# Patient Record
Sex: Male | Born: 1935 | Race: White | Hispanic: No | Marital: Married | Smoking: Never smoker
Health system: Southern US, Community
[De-identification: ages and names within clinical notes are randomized; demographics above are authoritative.]

## PROBLEM LIST (undated history)

## (undated) DIAGNOSIS — M1990 Unspecified osteoarthritis, unspecified site: Secondary | ICD-10-CM

## (undated) DIAGNOSIS — Z87442 Personal history of urinary calculi: Secondary | ICD-10-CM

## (undated) DIAGNOSIS — I10 Essential (primary) hypertension: Secondary | ICD-10-CM

## (undated) DIAGNOSIS — Z8719 Personal history of other diseases of the digestive system: Secondary | ICD-10-CM

## (undated) DIAGNOSIS — E119 Type 2 diabetes mellitus without complications: Secondary | ICD-10-CM

## (undated) DIAGNOSIS — K219 Gastro-esophageal reflux disease without esophagitis: Secondary | ICD-10-CM

## (undated) DIAGNOSIS — G4730 Sleep apnea, unspecified: Secondary | ICD-10-CM

## (undated) DIAGNOSIS — I639 Cerebral infarction, unspecified: Secondary | ICD-10-CM

## (undated) DIAGNOSIS — N179 Acute kidney failure, unspecified: Secondary | ICD-10-CM

## (undated) DIAGNOSIS — N189 Chronic kidney disease, unspecified: Secondary | ICD-10-CM

## (undated) HISTORY — PX: COLONOSCOPY: SHX174

## (undated) HISTORY — PX: EYE SURGERY: SHX253

## (undated) HISTORY — PX: WRIST SURGERY: SHX841

---

## 2005-03-29 ENCOUNTER — Ambulatory Visit: Payer: Self-pay | Admitting: Internal Medicine

## 2005-06-02 ENCOUNTER — Ambulatory Visit: Payer: Self-pay | Admitting: Internal Medicine

## 2008-10-14 ENCOUNTER — Ambulatory Visit: Payer: Self-pay | Admitting: Unknown Physician Specialty

## 2011-11-12 ENCOUNTER — Ambulatory Visit: Payer: Self-pay | Admitting: Internal Medicine

## 2012-01-31 ENCOUNTER — Other Ambulatory Visit: Payer: Self-pay | Admitting: Sports Medicine

## 2012-02-04 LAB — BODY FLUID CULTURE

## 2013-01-07 ENCOUNTER — Ambulatory Visit: Payer: Self-pay | Admitting: Internal Medicine

## 2013-01-07 LAB — CREATININE, SERUM
Creatinine: 1.25 mg/dL (ref 0.60–1.30)
EGFR (African American): >60
EGFR (Non-African Amer.): 54 — ABNORMAL LOW

## 2013-02-07 ENCOUNTER — Emergency Department: Payer: Self-pay | Admitting: Emergency Medicine

## 2014-02-10 DIAGNOSIS — N200 Calculus of kidney: Secondary | ICD-10-CM | POA: Insufficient documentation

## 2014-02-12 ENCOUNTER — Ambulatory Visit: Payer: Self-pay | Admitting: Internal Medicine

## 2014-03-24 ENCOUNTER — Ambulatory Visit: Payer: Self-pay | Admitting: Unknown Physician Specialty

## 2014-10-08 DIAGNOSIS — M5136 Other intervertebral disc degeneration, lumbar region: Secondary | ICD-10-CM | POA: Insufficient documentation

## 2015-03-23 DIAGNOSIS — R972 Elevated prostate specific antigen [PSA]: Secondary | ICD-10-CM | POA: Insufficient documentation

## 2015-03-23 DIAGNOSIS — E538 Deficiency of other specified B group vitamins: Secondary | ICD-10-CM | POA: Insufficient documentation

## 2015-08-05 DIAGNOSIS — H2513 Age-related nuclear cataract, bilateral: Secondary | ICD-10-CM | POA: Diagnosis not present

## 2015-08-06 ENCOUNTER — Encounter: Payer: Self-pay | Admitting: *Deleted

## 2015-08-11 NOTE — Discharge Instructions (Signed)

## 2015-08-12 ENCOUNTER — Ambulatory Visit: Payer: PPO | Admitting: Anesthesiology

## 2015-08-12 ENCOUNTER — Ambulatory Visit
Admission: RE | Admit: 2015-08-12 | Discharge: 2015-08-12 | Disposition: A | Discharge status: Home / Self Care | Payer: PPO | Source: Ambulatory Visit | Attending: Ophthalmology | Admitting: Ophthalmology

## 2015-08-12 ENCOUNTER — Encounter
Admission: RE | Disposition: A | Discharge status: Home / Self Care | Payer: Self-pay | Source: Ambulatory Visit | Attending: Ophthalmology

## 2015-08-12 DIAGNOSIS — Z8701 Personal history of pneumonia (recurrent): Secondary | ICD-10-CM | POA: Diagnosis not present

## 2015-08-12 DIAGNOSIS — Z791 Long term (current) use of non-steroidal anti-inflammatories (NSAID): Secondary | ICD-10-CM | POA: Insufficient documentation

## 2015-08-12 DIAGNOSIS — Z79899 Other long term (current) drug therapy: Secondary | ICD-10-CM | POA: Insufficient documentation

## 2015-08-12 DIAGNOSIS — Z881 Allergy status to other antibiotic agents status: Secondary | ICD-10-CM | POA: Diagnosis not present

## 2015-08-12 DIAGNOSIS — Z9889 Other specified postprocedural states: Secondary | ICD-10-CM | POA: Insufficient documentation

## 2015-08-12 DIAGNOSIS — H2512 Age-related nuclear cataract, left eye: Secondary | ICD-10-CM | POA: Diagnosis not present

## 2015-08-12 DIAGNOSIS — E119 Type 2 diabetes mellitus without complications: Secondary | ICD-10-CM | POA: Insufficient documentation

## 2015-08-12 DIAGNOSIS — K449 Diaphragmatic hernia without obstruction or gangrene: Secondary | ICD-10-CM | POA: Diagnosis not present

## 2015-08-12 DIAGNOSIS — H269 Unspecified cataract: Secondary | ICD-10-CM | POA: Diagnosis not present

## 2015-08-12 DIAGNOSIS — H2513 Age-related nuclear cataract, bilateral: Secondary | ICD-10-CM | POA: Diagnosis not present

## 2015-08-12 HISTORY — PX: CATARACT EXTRACTION W/PHACO: SHX586

## 2015-08-12 HISTORY — DX: Type 2 diabetes mellitus without complications: E11.9

## 2015-08-12 HISTORY — DX: Personal history of other diseases of the digestive system: Z87.19

## 2015-08-12 SURGERY — PHACOEMULSIFICATION, CATARACT, WITH IOL INSERTION
Anesthesia: Monitor Anesthesia Care | Laterality: Left | Wound class: Clean

## 2015-08-12 MED ORDER — TETRACAINE HCL 0.5 % OP SOLN
1.0000 [drp] | OPHTHALMIC | Status: DC | PRN
  Administered 2015-08-12: 1 [drp] via OPHTHALMIC

## 2015-08-12 MED ORDER — ONDANSETRON HCL 4 MG/2ML IJ SOLN: 4.0000 mg | Freq: Once | INTRAMUSCULAR | Status: DC | PRN

## 2015-08-12 MED ORDER — TIMOLOL MALEATE 0.5 % OP SOLN
OPHTHALMIC | Status: DC | PRN
  Administered 2015-08-12: 1 [drp] via OPHTHALMIC

## 2015-08-12 MED ORDER — POVIDONE-IODINE 5 % OP SOLN
1.0000 "application " | OPHTHALMIC | Status: DC | PRN
  Administered 2015-08-12: 1 via OPHTHALMIC

## 2015-08-12 MED ORDER — FENTANYL CITRATE (PF) 100 MCG/2ML IJ SOLN
INTRAMUSCULAR | Status: DC | PRN
  Administered 2015-08-12: 50 ug via INTRAVENOUS

## 2015-08-12 MED ORDER — BRIMONIDINE TARTRATE 0.2 % OP SOLN
OPHTHALMIC | Status: DC | PRN
  Administered 2015-08-12: 1 [drp] via OPHTHALMIC

## 2015-08-12 MED ORDER — MIDAZOLAM HCL 2 MG/2ML IJ SOLN
INTRAMUSCULAR | Status: DC | PRN
  Administered 2015-08-12: 1 mg via INTRAVENOUS

## 2015-08-12 MED ORDER — CEFUROXIME OPHTHALMIC INJECTION 1 MG/0.1 ML
INJECTION | OPHTHALMIC | Status: DC | PRN
  Administered 2015-08-12: 0.1 mL via OPHTHALMIC

## 2015-08-12 MED ORDER — ACETAMINOPHEN 325 MG PO TABS: 325.0000 mg | ORAL_TABLET | ORAL | Status: DC | PRN

## 2015-08-12 MED ORDER — ARMC OPHTHALMIC DILATING GEL
1.0000 "application " | OPHTHALMIC | Status: DC | PRN
  Administered 2015-08-12 (×2): 1 via OPHTHALMIC

## 2015-08-12 MED ORDER — BALANCED SALT IO SOLN
INTRAOCULAR | Status: DC | PRN
  Administered 2015-08-12: 1 mL via OPHTHALMIC

## 2015-08-12 MED ORDER — ACETAMINOPHEN 160 MG/5ML PO SOLN: 325.0000 mg | ORAL | Status: DC | PRN

## 2015-08-12 MED ORDER — EPINEPHRINE HCL 1 MG/ML IJ SOLN
INTRAOCULAR | Status: DC | PRN
  Administered 2015-08-12: 100 mL via OPHTHALMIC

## 2015-08-12 MED ORDER — NA HYALUR & NA CHOND-NA HYALUR 0.4-0.35 ML IO KIT
PACK | INTRAOCULAR | Status: DC | PRN
  Administered 2015-08-12: 1 mL via INTRAOCULAR

## 2015-08-12 SURGICAL SUPPLY — 28 items
CANNULA ANT/CHMB 27GA (MISCELLANEOUS) ×3 IMPLANT
CARTRIDGE ABBOTT (MISCELLANEOUS) ×3 IMPLANT
GLOVE SURG LX 7.5 STRW (GLOVE) ×2
GLOVE SURG LX STRL 7.5 STRW (GLOVE) ×1 IMPLANT
GLOVE SURG TRIUMPH 8.0 PF LTX (GLOVE) ×3 IMPLANT
GOWN STRL REUS W/ TWL LRG LVL3 (GOWN DISPOSABLE) ×2 IMPLANT
GOWN STRL REUS W/TWL LRG LVL3 (GOWN DISPOSABLE) ×4
LENS IOL TECNIS TRC I 225 21.0 (Intraocular Lens) ×1 IMPLANT
LENS IOL TORIC 21.0 (Intraocular Lens) ×2 IMPLANT
LENS IOL TORIC 225 21.0 (Intraocular Lens) ×1 IMPLANT
MARKER SKIN SURG W/RULER VIO (MISCELLANEOUS) ×3 IMPLANT
NDL RETROBULBAR .5 NSTRL (NEEDLE) IMPLANT
NEEDLE FILTER BLUNT 18X 1/2SAF (NEEDLE) ×2
NEEDLE FILTER BLUNT 18X1 1/2 (NEEDLE) ×1 IMPLANT
PACK CATARACT BRASINGTON (MISCELLANEOUS) ×3 IMPLANT
PACK EYE AFTER SURG (MISCELLANEOUS) ×3 IMPLANT
PACK OPTHALMIC (MISCELLANEOUS) ×3 IMPLANT
RING MALYGIN 7.0 (MISCELLANEOUS) IMPLANT
SUT ETHILON 10-0 CS-B-6CS-B-6 (SUTURE)
SUT VICRYL  9 0 (SUTURE)
SUT VICRYL 9 0 (SUTURE) IMPLANT
SUTURE EHLN 10-0 CS-B-6CS-B-6 (SUTURE) IMPLANT
SYR 3ML LL SCALE MARK (SYRINGE) ×3 IMPLANT
SYR 5ML LL (SYRINGE) IMPLANT
SYR TB 1ML LUER SLIP (SYRINGE) ×3 IMPLANT
WATER STERILE IRR 250ML POUR (IV SOLUTION) ×3 IMPLANT
WATER STERILE IRR 500ML POUR (IV SOLUTION) IMPLANT
WIPE NON LINTING 3.25X3.25 (MISCELLANEOUS) ×3 IMPLANT

## 2015-08-12 NOTE — Anesthesia Procedure Notes (Signed)
Procedure Name: MAC Performed by: Lamiya Naas Pre-anesthesia Checklist: Patient identified, Emergency Drugs available, Suction available, Patient being monitored and Timeout performed Patient Re-evaluated:Patient Re-evaluated prior to inductionOxygen Delivery Method: Nasal cannula       

## 2015-08-12 NOTE — H&P (Signed)
  The History and Physical notes are on paper, have been signed, and are to be scanned. The patient remains stable and unchanged from the H&P.   Previous H&P reviewed, patient examined, and there are no changes.  David Myers 08/12/2015 8:28 AM

## 2015-08-12 NOTE — Op Note (Signed)
LOCATION:  [place]   PREOPERATIVE DIAGNOSIS:  Nuclear sclerotic cataract of the left eye.  H25.12  POSTOPERATIVE DIAGNOSIS:  Nuclear sclerotic cataract of the left eye.   PROCEDURE:  Phacoemulsification with Toric posterior chamber intraocular lens placement of the left eye.   LENS:  Implant Name Type Inv. Item Serial No. Manufacturer Lot No. LRB No. Used  tecnis toric IOL     [id number] [facility]   Left 1   ZCT 225 21.0 D Toric intraocular lens with 2.25 diopters of cylindrical power with axis orientation at 7 degrees.   ULTRASOUND TIME: 14.7 % of 1 minutes, 42 seconds.  CDE 15.2   SURGEON:  Wyonia Hough, MD   ANESTHESIA: Topical with tetracaine drops and 2% Xylocaine jelly, augmented with 1% preservative-free intracameral lidocaine.    COMPLICATIONS:  None.   DESCRIPTION OF PROCEDURE:  The patient was identified in the holding room and transported to the operating suite and placed in the supine position under the operating microscope.  The left eye was identified as the operative eye, and it was prepped and draped in the usual sterile ophthalmic fashion.    A clear-corneal paracentesis incision was made at the 1:30 position. 0.5 ml of preservative-free 1% lidocaine was injected into the anterior chamber.  The anterior chamber was filled with Viscoat.  A 2.4 millimeter near clear corneal incision was then made at the 10:30 position.  A cystotome and capsulorrhexis forceps were then used to make a curvilinear capsulorrhexis.  Hydrodissection and hydrodelineation were then performed using balanced salt solution.   Phacoemulsification was then used in stop and chop fashion to remove the lens, nucleus and epinucleus.  The remaining cortex was aspirated using the irrigation and aspiration handpiece.  Provisc viscoelastic was then placed into the capsular bag to distend it for lens placement.  The Verion digital marker was used to align the implant at the intended  axis.   A 21.0 diopter lens was then injected into the capsular bag.  It was rotated clockwise until the axis marks on the lens were approximately 15 degrees in the counterclockwise direction to the intended alignment.  The viscoelastic was aspirated from the eye using the irrigation aspiration handpiece.  Then, a Koch spatula through the sideport incision was used to rotate the lens in a clockwise direction until the axis markings of the intraocular lens were lined up with the Verion alignment.  Balanced salt solution was then used to hydrate the wounds. Cefuroxime 0.1 ml of a 10mg /ml solution was injected into the anterior chamber for a dose of 1 mg of intracameral antibiotic at the completion of the case.    The eye was noted to have a physiologic pressure and there was no wound leak noted.   Timolol and Brimonidine drops were applied to the eye.  The patient was taken to the recovery room in stable condition having had no complications of anesthesia or surgery.  David Myers 08/12/2015, 9:20 AM

## 2015-08-12 NOTE — Anesthesia Preprocedure Evaluation (Signed)
Anesthesia Evaluation  Patient identified by MRN, date of birth, ID band Patient awake    Reviewed: Allergy & Precautions, H&P , NPO status , Patient's Chart, lab work & pertinent test results  Airway Mallampati: II  TM Distance: >3 FB Neck ROM: full    Dental   Pulmonary    Pulmonary exam normal        Cardiovascular Normal cardiovascular exam     Neuro/Psych    GI/Hepatic hiatal hernia,   Endo/Other  diabetes  Renal/GU      Musculoskeletal   Abdominal   Peds  Hematology   Anesthesia Other Findings   Reproductive/Obstetrics                             Anesthesia Physical Anesthesia Plan  ASA: II  Anesthesia Plan: MAC   Post-op Pain Management:    Induction:   Airway Management Planned:   Additional Equipment:   Intra-op Plan:   Post-operative Plan:   Informed Consent: I have reviewed the patients History and Physical, chart, labs and discussed the procedure including the risks, benefits and alternatives for the proposed anesthesia with the patient or authorized representative who has indicated his/her understanding and acceptance.     Plan Discussed with: CRNA  Anesthesia Plan Comments:         Anesthesia Quick Evaluation

## 2015-08-12 NOTE — Anesthesia Postprocedure Evaluation (Signed)
Anesthesia Post Note  Patient: David Myers  Procedure(s) Performed: Procedure(s) (LRB): CATARACT EXTRACTION PHACO AND INTRAOCULAR LENS PLACEMENT (IOC) (Left)  Patient location during evaluation: PACU Anesthesia Type: MAC Level of consciousness: awake and alert Pain management: pain level controlled Vital Signs Assessment: post-procedure vital signs reviewed and stable Respiratory status: spontaneous breathing, nonlabored ventilation, respiratory function stable and patient connected to nasal cannula oxygen Cardiovascular status: stable and blood pressure returned to baseline Anesthetic complications: no    Amaryllis Dyke

## 2015-08-12 NOTE — Transfer of Care (Signed)
Immediate Anesthesia Transfer of Care Note  Patient: David Myers  Procedure(s) Performed: Procedure(s) with comments: CATARACT EXTRACTION PHACO AND INTRAOCULAR LENS PLACEMENT (IOC) (Left) - DIABETIC - diet controlled TORIC  Patient Location: PACU  Anesthesia Type: MAC  Level of Consciousness: awake, alert  and patient cooperative  Airway and Oxygen Therapy: Patient Spontanous Breathing and Patient connected to supplemental oxygen  Post-op Assessment: Post-op Vital signs reviewed, Patient's Cardiovascular Status Stable, Respiratory Function Stable, Patent Airway and No signs of Nausea or vomiting  Post-op Vital Signs: Reviewed and stable  Complications: No apparent anesthesia complications

## 2015-08-13 ENCOUNTER — Encounter: Payer: Self-pay | Admitting: Ophthalmology

## 2015-09-02 DIAGNOSIS — H2511 Age-related nuclear cataract, right eye: Secondary | ICD-10-CM | POA: Diagnosis not present

## 2015-09-03 ENCOUNTER — Encounter: Payer: Self-pay | Admitting: *Deleted

## 2015-09-08 NOTE — Discharge Instructions (Signed)

## 2015-09-09 ENCOUNTER — Ambulatory Visit: Payer: PPO | Admitting: Anesthesiology

## 2015-09-09 ENCOUNTER — Encounter
Admission: RE | Disposition: A | Discharge status: Home / Self Care | Payer: Self-pay | Source: Ambulatory Visit | Attending: Ophthalmology

## 2015-09-09 ENCOUNTER — Ambulatory Visit
Admission: RE | Admit: 2015-09-09 | Discharge: 2015-09-09 | Disposition: A | Discharge status: Home / Self Care | Payer: PPO | Source: Ambulatory Visit | Attending: Ophthalmology | Admitting: Ophthalmology

## 2015-09-09 DIAGNOSIS — H9190 Unspecified hearing loss, unspecified ear: Secondary | ICD-10-CM | POA: Insufficient documentation

## 2015-09-09 DIAGNOSIS — Z881 Allergy status to other antibiotic agents status: Secondary | ICD-10-CM | POA: Diagnosis not present

## 2015-09-09 DIAGNOSIS — E119 Type 2 diabetes mellitus without complications: Secondary | ICD-10-CM | POA: Diagnosis not present

## 2015-09-09 DIAGNOSIS — M549 Dorsalgia, unspecified: Secondary | ICD-10-CM | POA: Diagnosis not present

## 2015-09-09 DIAGNOSIS — Z79899 Other long term (current) drug therapy: Secondary | ICD-10-CM | POA: Diagnosis not present

## 2015-09-09 DIAGNOSIS — H2511 Age-related nuclear cataract, right eye: Secondary | ICD-10-CM | POA: Insufficient documentation

## 2015-09-09 DIAGNOSIS — K449 Diaphragmatic hernia without obstruction or gangrene: Secondary | ICD-10-CM | POA: Insufficient documentation

## 2015-09-09 DIAGNOSIS — Z9842 Cataract extraction status, left eye: Secondary | ICD-10-CM | POA: Diagnosis not present

## 2015-09-09 HISTORY — PX: CATARACT EXTRACTION W/PHACO: SHX586

## 2015-09-09 SURGERY — PHACOEMULSIFICATION, CATARACT, WITH IOL INSERTION
Anesthesia: Monitor Anesthesia Care | Laterality: Right | Wound class: Clean

## 2015-09-09 MED ORDER — TIMOLOL MALEATE 0.5 % OP SOLN
OPHTHALMIC | Status: DC | PRN
  Administered 2015-09-09: 1 [drp] via OPHTHALMIC

## 2015-09-09 MED ORDER — ARMC OPHTHALMIC DILATING GEL
1.0000 "application " | OPHTHALMIC | Status: DC | PRN
  Administered 2015-09-09 (×2): 1 via OPHTHALMIC

## 2015-09-09 MED ORDER — NA HYALUR & NA CHOND-NA HYALUR 0.4-0.35 ML IO KIT
PACK | INTRAOCULAR | Status: DC | PRN
  Administered 2015-09-09: 1 mL via INTRAOCULAR

## 2015-09-09 MED ORDER — LACTATED RINGERS IV SOLN: INTRAVENOUS | Status: DC

## 2015-09-09 MED ORDER — TETRACAINE HCL 0.5 % OP SOLN
1.0000 [drp] | OPHTHALMIC | Status: DC | PRN
  Administered 2015-09-09: 1 [drp] via OPHTHALMIC

## 2015-09-09 MED ORDER — BRIMONIDINE TARTRATE 0.2 % OP SOLN
OPHTHALMIC | Status: DC | PRN
  Administered 2015-09-09: 1 [drp] via OPHTHALMIC

## 2015-09-09 MED ORDER — FENTANYL CITRATE (PF) 100 MCG/2ML IJ SOLN: 25.0000 ug | INTRAMUSCULAR | Status: DC | PRN

## 2015-09-09 MED ORDER — MIDAZOLAM HCL 5 MG/5ML IJ SOLN
INTRAMUSCULAR | Status: DC | PRN
  Administered 2015-09-09: 2 mg via INTRAVENOUS

## 2015-09-09 MED ORDER — OXYCODONE HCL 5 MG/5ML PO SOLN: 5.0000 mg | Freq: Once | ORAL | Status: DC | PRN

## 2015-09-09 MED ORDER — ACETAMINOPHEN 160 MG/5ML PO SOLN: 325.0000 mg | ORAL | Status: DC | PRN

## 2015-09-09 MED ORDER — DEXAMETHASONE SODIUM PHOSPHATE 4 MG/ML IJ SOLN: 8.0000 mg | Freq: Once | INTRAMUSCULAR | Status: DC | PRN

## 2015-09-09 MED ORDER — CEFUROXIME OPHTHALMIC INJECTION 1 MG/0.1 ML
INJECTION | OPHTHALMIC | Status: DC | PRN
  Administered 2015-09-09: 0.1 mL via OPHTHALMIC

## 2015-09-09 MED ORDER — ACETAMINOPHEN 325 MG PO TABS: 325.0000 mg | ORAL_TABLET | ORAL | Status: DC | PRN

## 2015-09-09 MED ORDER — POVIDONE-IODINE 5 % OP SOLN
1.0000 "application " | OPHTHALMIC | Status: DC | PRN
  Administered 2015-09-09: 1 via OPHTHALMIC

## 2015-09-09 MED ORDER — FENTANYL CITRATE (PF) 100 MCG/2ML IJ SOLN
INTRAMUSCULAR | Status: DC | PRN
  Administered 2015-09-09: 50 ug via INTRAVENOUS

## 2015-09-09 MED ORDER — LACTATED RINGERS IV SOLN: 500.0000 mL | INTRAVENOUS | Status: DC

## 2015-09-09 MED ORDER — ERYTHROMYCIN 5 MG/GM OP OINT
TOPICAL_OINTMENT | OPHTHALMIC | Status: DC | PRN
  Administered 2015-09-09: 1 via OPHTHALMIC

## 2015-09-09 MED ORDER — EPINEPHRINE HCL 1 MG/ML IJ SOLN
INTRAOCULAR | Status: DC | PRN
  Administered 2015-09-09: 80 mL via OPHTHALMIC

## 2015-09-09 MED ORDER — OXYCODONE HCL 5 MG PO TABS: 5.0000 mg | ORAL_TABLET | Freq: Once | ORAL | Status: DC | PRN

## 2015-09-09 MED ORDER — LIDOCAINE HCL (PF) 4 % IJ SOLN
INTRAMUSCULAR | Status: DC | PRN
  Administered 2015-09-09: 1 mL via OPHTHALMIC

## 2015-09-09 SURGICAL SUPPLY — 28 items
CANNULA ANT/CHMB 27GA (MISCELLANEOUS) ×3 IMPLANT
CARTRIDGE ABBOTT (MISCELLANEOUS) ×3 IMPLANT
GLOVE SURG LX 7.5 STRW (GLOVE) ×2
GLOVE SURG LX STRL 7.5 STRW (GLOVE) ×1 IMPLANT
GLOVE SURG TRIUMPH 8.0 PF LTX (GLOVE) ×3 IMPLANT
GOWN STRL REUS W/ TWL LRG LVL3 (GOWN DISPOSABLE) ×2 IMPLANT
GOWN STRL REUS W/TWL LRG LVL3 (GOWN DISPOSABLE) ×4
LENS IOL TECNIS TRC I 300 21.5 (Intraocular Lens) ×1 IMPLANT
LENS IOL TORIC 21.5 (Intraocular Lens) ×2 IMPLANT
LENS IOL TORIC 300 21.5 (Intraocular Lens) ×1 IMPLANT
MARKER SKIN SURG W/RULER VIO (MISCELLANEOUS) ×3 IMPLANT
NDL RETROBULBAR .5 NSTRL (NEEDLE) IMPLANT
NEEDLE FILTER BLUNT 18X 1/2SAF (NEEDLE) ×2
NEEDLE FILTER BLUNT 18X1 1/2 (NEEDLE) ×1 IMPLANT
PACK CATARACT BRASINGTON (MISCELLANEOUS) ×3 IMPLANT
PACK EYE AFTER SURG (MISCELLANEOUS) ×3 IMPLANT
PACK OPTHALMIC (MISCELLANEOUS) ×3 IMPLANT
RING MALYGIN 7.0 (MISCELLANEOUS) IMPLANT
SUT ETHILON 10-0 CS-B-6CS-B-6 (SUTURE)
SUT VICRYL  9 0 (SUTURE)
SUT VICRYL 9 0 (SUTURE) IMPLANT
SUTURE EHLN 10-0 CS-B-6CS-B-6 (SUTURE) IMPLANT
SYR 3ML LL SCALE MARK (SYRINGE) ×3 IMPLANT
SYR 5ML LL (SYRINGE) IMPLANT
SYR TB 1ML LUER SLIP (SYRINGE) ×3 IMPLANT
WATER STERILE IRR 250ML POUR (IV SOLUTION) ×3 IMPLANT
WATER STERILE IRR 500ML POUR (IV SOLUTION) IMPLANT
WIPE NON LINTING 3.25X3.25 (MISCELLANEOUS) ×3 IMPLANT

## 2015-09-09 NOTE — Anesthesia Preprocedure Evaluation (Addendum)
Anesthesia Evaluation  Patient identified by MRN, date of birth, ID band Patient awake    Reviewed: Allergy & Precautions, H&P , NPO status , Patient's Chart, lab work & pertinent test results, reviewed documented beta blocker date and time   Airway Mallampati: II  TM Distance: >3 FB Neck ROM: full    Dental no notable dental hx.    Pulmonary neg pulmonary ROS,    Pulmonary exam normal breath sounds clear to auscultation       Cardiovascular Exercise Tolerance: Good negative cardio ROS   Rhythm:regular Rate:Normal     Neuro/Psych negative neurological ROS  negative psych ROS   GI/Hepatic Neg liver ROS, hiatal hernia,   Endo/Other  diabetes, Well Controlled, Type 2  Renal/GU negative Renal ROS  negative genitourinary   Musculoskeletal   Abdominal   Peds  Hematology negative hematology ROS (+)   Anesthesia Other Findings   Reproductive/Obstetrics negative OB ROS                            Anesthesia Physical Anesthesia Plan  ASA: II  Anesthesia Plan: MAC   Post-op Pain Management:    Induction:   Airway Management Planned:   Additional Equipment:   Intra-op Plan:   Post-operative Plan:   Informed Consent: I have reviewed the patients History and Physical, chart, labs and discussed the procedure including the risks, benefits and alternatives for the proposed anesthesia with the patient or authorized representative who has indicated his/her understanding and acceptance.     Plan Discussed with: CRNA  Anesthesia Plan Comments:        Anesthesia Quick Evaluation

## 2015-09-09 NOTE — Anesthesia Postprocedure Evaluation (Signed)
Anesthesia Post Note  Patient: David Myers  Procedure(s) Performed: Procedure(s) (LRB): CATARACT EXTRACTION PHACO AND INTRAOCULAR LENS PLACEMENT (IOC) (Right)  Patient location during evaluation: PACU Anesthesia Type: MAC Level of consciousness: awake and alert Pain management: pain level controlled Vital Signs Assessment: post-procedure vital signs reviewed and stable Respiratory status: spontaneous breathing, nonlabored ventilation and respiratory function stable Cardiovascular status: blood pressure returned to baseline and stable Postop Assessment: no signs of nausea or vomiting Anesthetic complications: no    DANIEL D KOVACS

## 2015-09-09 NOTE — Op Note (Signed)
LOCATION:  [place]   PREOPERATIVE DIAGNOSIS:  Nuclear sclerotic cataract of the right eye.  H25.11   POSTOPERATIVE DIAGNOSIS:  Nuclear sclerotic cataract of the right eye.   PROCEDURE:  Phacoemulsification with Toric posterior chamber intraocular lens placement of the right eye.   LENS:   Implant Name Type Inv. Item Serial No. Manufacturer Lot No. LRB No. Used  [id number]     [id number] [facility]   Right 1     ZCT300 21.5 D Toric intraocular lens with 3.00 diopters of cylindrical power with axis orientation at 12 degrees.   ULTRASOUND TIME: 15 % of 1 minutes, 44 seconds.  CDE 15.3   SURGEON:  Wyonia Hough, MD   ANESTHESIA:  Topical with tetracaine drops and 2% Xylocaine jelly, augmented with 1% preservative-free intracameral lidocaine.    COMPLICATIONS:  None.   DESCRIPTION OF PROCEDURE:  The patient was identified in the holding room and transported to the operating suite and placed in the supine position under the operating microscope.  The right eye was identified as the operative eye, and it was prepped and draped in the usual sterile ophthalmic fashion.    A clear-corneal paracentesis incision was made at the 12:00 position.  0.5 ml of preservative-free 1% lidocaine was injected into the anterior chamber. The anterior chamber was filled with Viscoat.  A 2.4 millimeter near clear corneal incision was then made at the 9:00 position.  A cystotome and capsulorrhexis forceps were then used to make a curvilinear capsulorrhexis.  Hydrodissection and hydrodelineation were then performed using balanced salt solution.   Phacoemulsification was then used in stop and chop fashion to remove the lens, nucleus and epinucleus.  The remaining cortex was aspirated using the irrigation and aspiration handpiece.  Provisc viscoelastic was then placed into the capsular bag to distend it for lens placement.  The Verion digital marker was used to align the implant at the intended axis.   A Toric lens was then injected into the capsular bag.  It was rotated clockwise until the axis marks on the lens were approximately 15 degrees in the counterclockwise direction to the intended alignment.  The viscoelastic was aspirated from the eye using the irrigation aspiration handpiece.  Then, a Koch spatula through the sideport incision was used to rotate the lens in a clockwise direction until the axis markings of the intraocular lens were lined up with the Verion alignment.  Balanced salt solution was then used to hydrate the wounds. Cefuroxime 0.1 ml of a 10mg /ml solution was injected into the anterior chamber for a dose of 1 mg of intracameral antibiotic at the completion of the case.    The eye was noted to have a physiologic pressure and there was no wound leak noted.   Timolol and Brimonidine drops and erythromycin ointment were applied to the eye.  The patient was taken to the recovery room in stable condition having had no complications of anesthesia or surgery.  David Myers 09/09/2015, 8:09 AM

## 2015-09-09 NOTE — Transfer of Care (Signed)
Immediate Anesthesia Transfer of Care Note  Patient: David Myers  Procedure(s) Performed: Procedure(s) with comments: CATARACT EXTRACTION PHACO AND INTRAOCULAR LENS PLACEMENT (IOC) (Right) - DIABETIC - diet controlled  Patient Location: PACU  Anesthesia Type: MAC  Level of Consciousness: awake, alert  and patient cooperative  Airway and Oxygen Therapy: Patient Spontanous Breathing and Patient connected to supplemental oxygen  Post-op Assessment: Post-op Vital signs reviewed, Patient's Cardiovascular Status Stable, Respiratory Function Stable, Patent Airway and No signs of Nausea or vomiting  Post-op Vital Signs: Reviewed and stable  Complications: No apparent anesthesia complications

## 2015-09-09 NOTE — Anesthesia Procedure Notes (Signed)
Procedure Name: MAC Performed by: Stryker Veasey Pre-anesthesia Checklist: Patient identified, Emergency Drugs available, Suction available, Timeout performed and Patient being monitored Patient Re-evaluated:Patient Re-evaluated prior to inductionOxygen Delivery Method: Nasal cannula Placement Confirmation: positive ETCO2     

## 2015-09-09 NOTE — H&P (Signed)
  The History and Physical notes are on paper, have been signed, and are to be scanned. The patient remains stable and unchanged from the H&P.   Previous H&P reviewed, patient examined, and there are no changes.  Steven Veazie 09/09/2015 7:33 AM

## 2015-09-10 ENCOUNTER — Encounter: Payer: Self-pay | Admitting: Ophthalmology

## 2015-09-16 DIAGNOSIS — E559 Vitamin D deficiency, unspecified: Secondary | ICD-10-CM | POA: Diagnosis not present

## 2015-09-16 DIAGNOSIS — R972 Elevated prostate specific antigen [PSA]: Secondary | ICD-10-CM | POA: Diagnosis not present

## 2015-09-16 DIAGNOSIS — E119 Type 2 diabetes mellitus without complications: Secondary | ICD-10-CM | POA: Diagnosis not present

## 2015-09-16 DIAGNOSIS — E538 Deficiency of other specified B group vitamins: Secondary | ICD-10-CM | POA: Diagnosis not present

## 2015-09-16 DIAGNOSIS — Z79899 Other long term (current) drug therapy: Secondary | ICD-10-CM | POA: Diagnosis not present

## 2015-09-23 DIAGNOSIS — E119 Type 2 diabetes mellitus without complications: Secondary | ICD-10-CM | POA: Diagnosis not present

## 2015-09-23 DIAGNOSIS — Z0000 Encounter for general adult medical examination without abnormal findings: Secondary | ICD-10-CM | POA: Diagnosis not present

## 2015-09-23 DIAGNOSIS — E538 Deficiency of other specified B group vitamins: Secondary | ICD-10-CM | POA: Diagnosis not present

## 2015-10-01 ENCOUNTER — Ambulatory Visit
Admission: RE | Admit: 2015-10-01 | Discharge: 2015-10-01 | Disposition: A | Discharge status: Home / Self Care | Payer: PPO | Source: Ambulatory Visit | Attending: Internal Medicine | Admitting: Internal Medicine

## 2015-10-01 ENCOUNTER — Other Ambulatory Visit: Payer: Self-pay | Admitting: Internal Medicine

## 2015-10-01 DIAGNOSIS — I6529 Occlusion and stenosis of unspecified carotid artery: Secondary | ICD-10-CM | POA: Diagnosis not present

## 2015-10-01 DIAGNOSIS — G319 Degenerative disease of nervous system, unspecified: Secondary | ICD-10-CM | POA: Insufficient documentation

## 2015-10-01 DIAGNOSIS — D329 Benign neoplasm of meninges, unspecified: Secondary | ICD-10-CM | POA: Diagnosis not present

## 2015-10-01 DIAGNOSIS — R9082 White matter disease, unspecified: Secondary | ICD-10-CM | POA: Insufficient documentation

## 2015-10-01 DIAGNOSIS — I639 Cerebral infarction, unspecified: Secondary | ICD-10-CM | POA: Diagnosis not present

## 2015-10-01 DIAGNOSIS — I6523 Occlusion and stenosis of bilateral carotid arteries: Secondary | ICD-10-CM | POA: Diagnosis not present

## 2015-10-01 DIAGNOSIS — D320 Benign neoplasm of cerebral meninges: Secondary | ICD-10-CM | POA: Diagnosis not present

## 2015-10-01 MED ORDER — GADOBENATE DIMEGLUMINE 529 MG/ML IV SOLN
20.0000 mL | Freq: Once | INTRAVENOUS | Status: AC | PRN
  Administered 2015-10-01: 17 mL via INTRAVENOUS

## 2015-10-07 DIAGNOSIS — I639 Cerebral infarction, unspecified: Secondary | ICD-10-CM | POA: Diagnosis not present

## 2015-10-08 DIAGNOSIS — K219 Gastro-esophageal reflux disease without esophagitis: Secondary | ICD-10-CM | POA: Diagnosis not present

## 2015-10-08 DIAGNOSIS — R490 Dysphonia: Secondary | ICD-10-CM | POA: Diagnosis not present

## 2015-11-12 DIAGNOSIS — N3289 Other specified disorders of bladder: Secondary | ICD-10-CM | POA: Diagnosis not present

## 2015-11-12 DIAGNOSIS — G451 Carotid artery syndrome (hemispheric): Secondary | ICD-10-CM | POA: Diagnosis not present

## 2015-11-19 DIAGNOSIS — R490 Dysphonia: Secondary | ICD-10-CM | POA: Diagnosis not present

## 2015-11-19 DIAGNOSIS — H903 Sensorineural hearing loss, bilateral: Secondary | ICD-10-CM | POA: Diagnosis not present

## 2015-11-19 DIAGNOSIS — K219 Gastro-esophageal reflux disease without esophagitis: Secondary | ICD-10-CM | POA: Diagnosis not present

## 2016-03-18 DIAGNOSIS — E119 Type 2 diabetes mellitus without complications: Secondary | ICD-10-CM | POA: Diagnosis not present

## 2016-03-18 DIAGNOSIS — E538 Deficiency of other specified B group vitamins: Secondary | ICD-10-CM | POA: Diagnosis not present

## 2016-03-18 DIAGNOSIS — Z0000 Encounter for general adult medical examination without abnormal findings: Secondary | ICD-10-CM | POA: Diagnosis not present

## 2016-03-22 DIAGNOSIS — E119 Type 2 diabetes mellitus without complications: Secondary | ICD-10-CM | POA: Diagnosis not present

## 2016-03-22 DIAGNOSIS — E538 Deficiency of other specified B group vitamins: Secondary | ICD-10-CM | POA: Diagnosis not present

## 2016-04-11 DIAGNOSIS — H401212 Low-tension glaucoma, right eye, moderate stage: Secondary | ICD-10-CM | POA: Diagnosis not present

## 2016-04-11 DIAGNOSIS — Z961 Presence of intraocular lens: Secondary | ICD-10-CM | POA: Diagnosis not present

## 2016-04-11 DIAGNOSIS — H5203 Hypermetropia, bilateral: Secondary | ICD-10-CM | POA: Diagnosis not present

## 2016-04-11 DIAGNOSIS — H524 Presbyopia: Secondary | ICD-10-CM | POA: Diagnosis not present

## 2016-04-11 DIAGNOSIS — H401221 Low-tension glaucoma, left eye, mild stage: Secondary | ICD-10-CM | POA: Diagnosis not present

## 2016-04-11 DIAGNOSIS — H43811 Vitreous degeneration, right eye: Secondary | ICD-10-CM | POA: Diagnosis not present

## 2016-04-11 DIAGNOSIS — H52221 Regular astigmatism, right eye: Secondary | ICD-10-CM | POA: Diagnosis not present

## 2016-06-10 DIAGNOSIS — D2339 Other benign neoplasm of skin of other parts of face: Secondary | ICD-10-CM | POA: Diagnosis not present

## 2016-06-10 DIAGNOSIS — D225 Melanocytic nevi of trunk: Secondary | ICD-10-CM | POA: Diagnosis not present

## 2016-06-10 DIAGNOSIS — D2261 Melanocytic nevi of right upper limb, including shoulder: Secondary | ICD-10-CM | POA: Diagnosis not present

## 2016-06-10 DIAGNOSIS — L821 Other seborrheic keratosis: Secondary | ICD-10-CM | POA: Diagnosis not present

## 2016-07-11 DIAGNOSIS — H52221 Regular astigmatism, right eye: Secondary | ICD-10-CM | POA: Diagnosis not present

## 2016-07-11 DIAGNOSIS — H401212 Low-tension glaucoma, right eye, moderate stage: Secondary | ICD-10-CM | POA: Diagnosis not present

## 2016-07-11 DIAGNOSIS — Z961 Presence of intraocular lens: Secondary | ICD-10-CM | POA: Diagnosis not present

## 2016-07-11 DIAGNOSIS — H401221 Low-tension glaucoma, left eye, mild stage: Secondary | ICD-10-CM | POA: Diagnosis not present

## 2016-07-11 DIAGNOSIS — H5203 Hypermetropia, bilateral: Secondary | ICD-10-CM | POA: Diagnosis not present

## 2016-07-11 DIAGNOSIS — H524 Presbyopia: Secondary | ICD-10-CM | POA: Diagnosis not present

## 2016-07-11 DIAGNOSIS — H43811 Vitreous degeneration, right eye: Secondary | ICD-10-CM | POA: Diagnosis not present

## 2016-09-19 DIAGNOSIS — Z125 Encounter for screening for malignant neoplasm of prostate: Secondary | ICD-10-CM | POA: Diagnosis not present

## 2016-09-19 DIAGNOSIS — E538 Deficiency of other specified B group vitamins: Secondary | ICD-10-CM | POA: Diagnosis not present

## 2016-09-19 DIAGNOSIS — E119 Type 2 diabetes mellitus without complications: Secondary | ICD-10-CM | POA: Diagnosis not present

## 2016-09-26 DIAGNOSIS — R7989 Other specified abnormal findings of blood chemistry: Secondary | ICD-10-CM | POA: Insufficient documentation

## 2016-09-26 DIAGNOSIS — E119 Type 2 diabetes mellitus without complications: Secondary | ICD-10-CM | POA: Diagnosis not present

## 2016-09-26 DIAGNOSIS — Z0000 Encounter for general adult medical examination without abnormal findings: Secondary | ICD-10-CM | POA: Diagnosis not present

## 2016-10-04 DIAGNOSIS — H524 Presbyopia: Secondary | ICD-10-CM | POA: Diagnosis not present

## 2016-10-04 DIAGNOSIS — H43811 Vitreous degeneration, right eye: Secondary | ICD-10-CM | POA: Diagnosis not present

## 2016-10-04 DIAGNOSIS — H47233 Glaucomatous optic atrophy, bilateral: Secondary | ICD-10-CM | POA: Diagnosis not present

## 2016-10-04 DIAGNOSIS — Z961 Presence of intraocular lens: Secondary | ICD-10-CM | POA: Diagnosis not present

## 2016-10-04 DIAGNOSIS — H52221 Regular astigmatism, right eye: Secondary | ICD-10-CM | POA: Diagnosis not present

## 2016-10-04 DIAGNOSIS — H401212 Low-tension glaucoma, right eye, moderate stage: Secondary | ICD-10-CM | POA: Diagnosis not present

## 2016-10-04 DIAGNOSIS — H401221 Low-tension glaucoma, left eye, mild stage: Secondary | ICD-10-CM | POA: Diagnosis not present

## 2016-10-04 DIAGNOSIS — H5203 Hypermetropia, bilateral: Secondary | ICD-10-CM | POA: Diagnosis not present

## 2017-01-03 DIAGNOSIS — H5203 Hypermetropia, bilateral: Secondary | ICD-10-CM | POA: Diagnosis not present

## 2017-01-03 DIAGNOSIS — H52221 Regular astigmatism, right eye: Secondary | ICD-10-CM | POA: Diagnosis not present

## 2017-01-03 DIAGNOSIS — H43811 Vitreous degeneration, right eye: Secondary | ICD-10-CM | POA: Diagnosis not present

## 2017-01-03 DIAGNOSIS — H401231 Low-tension glaucoma, bilateral, mild stage: Secondary | ICD-10-CM | POA: Diagnosis not present

## 2017-01-03 DIAGNOSIS — H524 Presbyopia: Secondary | ICD-10-CM | POA: Diagnosis not present

## 2017-01-03 DIAGNOSIS — Z961 Presence of intraocular lens: Secondary | ICD-10-CM | POA: Diagnosis not present

## 2017-01-03 DIAGNOSIS — H47233 Glaucomatous optic atrophy, bilateral: Secondary | ICD-10-CM | POA: Diagnosis not present

## 2017-01-04 DIAGNOSIS — Z961 Presence of intraocular lens: Secondary | ICD-10-CM | POA: Diagnosis not present

## 2017-03-24 DIAGNOSIS — Z0000 Encounter for general adult medical examination without abnormal findings: Secondary | ICD-10-CM | POA: Diagnosis not present

## 2017-03-24 DIAGNOSIS — E119 Type 2 diabetes mellitus without complications: Secondary | ICD-10-CM | POA: Diagnosis not present

## 2017-03-28 DIAGNOSIS — E538 Deficiency of other specified B group vitamins: Secondary | ICD-10-CM | POA: Diagnosis not present

## 2017-03-28 DIAGNOSIS — E782 Mixed hyperlipidemia: Secondary | ICD-10-CM | POA: Insufficient documentation

## 2017-03-28 DIAGNOSIS — Z125 Encounter for screening for malignant neoplasm of prostate: Secondary | ICD-10-CM | POA: Diagnosis not present

## 2017-03-28 DIAGNOSIS — E119 Type 2 diabetes mellitus without complications: Secondary | ICD-10-CM | POA: Diagnosis not present

## 2017-04-05 DIAGNOSIS — H401112 Primary open-angle glaucoma, right eye, moderate stage: Secondary | ICD-10-CM | POA: Diagnosis not present

## 2017-04-05 DIAGNOSIS — H43811 Vitreous degeneration, right eye: Secondary | ICD-10-CM | POA: Diagnosis not present

## 2017-04-05 DIAGNOSIS — H52221 Regular astigmatism, right eye: Secondary | ICD-10-CM | POA: Diagnosis not present

## 2017-04-05 DIAGNOSIS — H5203 Hypermetropia, bilateral: Secondary | ICD-10-CM | POA: Diagnosis not present

## 2017-04-05 DIAGNOSIS — H401121 Primary open-angle glaucoma, left eye, mild stage: Secondary | ICD-10-CM | POA: Diagnosis not present

## 2017-04-05 DIAGNOSIS — Z961 Presence of intraocular lens: Secondary | ICD-10-CM | POA: Diagnosis not present

## 2017-04-05 DIAGNOSIS — H524 Presbyopia: Secondary | ICD-10-CM | POA: Diagnosis not present

## 2017-04-05 DIAGNOSIS — H47233 Glaucomatous optic atrophy, bilateral: Secondary | ICD-10-CM | POA: Diagnosis not present

## 2017-06-08 DIAGNOSIS — Z87438 Personal history of other diseases of male genital organs: Secondary | ICD-10-CM | POA: Diagnosis not present

## 2017-06-08 DIAGNOSIS — E119 Type 2 diabetes mellitus without complications: Secondary | ICD-10-CM | POA: Diagnosis not present

## 2017-06-08 DIAGNOSIS — R6889 Other general symptoms and signs: Secondary | ICD-10-CM | POA: Diagnosis not present

## 2017-06-12 DIAGNOSIS — J340 Abscess, furuncle and carbuncle of nose: Secondary | ICD-10-CM | POA: Diagnosis not present

## 2017-06-12 DIAGNOSIS — J40 Bronchitis, not specified as acute or chronic: Secondary | ICD-10-CM | POA: Diagnosis not present

## 2017-06-12 DIAGNOSIS — J45902 Unspecified asthma with status asthmaticus: Secondary | ICD-10-CM | POA: Diagnosis not present

## 2017-06-16 DIAGNOSIS — J340 Abscess, furuncle and carbuncle of nose: Secondary | ICD-10-CM | POA: Diagnosis not present

## 2017-06-16 DIAGNOSIS — J40 Bronchitis, not specified as acute or chronic: Secondary | ICD-10-CM | POA: Diagnosis not present

## 2017-07-03 DIAGNOSIS — H5203 Hypermetropia, bilateral: Secondary | ICD-10-CM | POA: Diagnosis not present

## 2017-07-03 DIAGNOSIS — H43811 Vitreous degeneration, right eye: Secondary | ICD-10-CM | POA: Diagnosis not present

## 2017-07-03 DIAGNOSIS — H401112 Primary open-angle glaucoma, right eye, moderate stage: Secondary | ICD-10-CM | POA: Diagnosis not present

## 2017-07-03 DIAGNOSIS — H47233 Glaucomatous optic atrophy, bilateral: Secondary | ICD-10-CM | POA: Diagnosis not present

## 2017-07-03 DIAGNOSIS — H52221 Regular astigmatism, right eye: Secondary | ICD-10-CM | POA: Diagnosis not present

## 2017-07-03 DIAGNOSIS — H524 Presbyopia: Secondary | ICD-10-CM | POA: Diagnosis not present

## 2017-07-03 DIAGNOSIS — Z961 Presence of intraocular lens: Secondary | ICD-10-CM | POA: Diagnosis not present

## 2017-07-03 DIAGNOSIS — H401121 Primary open-angle glaucoma, left eye, mild stage: Secondary | ICD-10-CM | POA: Diagnosis not present

## 2017-09-15 DIAGNOSIS — D2262 Melanocytic nevi of left upper limb, including shoulder: Secondary | ICD-10-CM | POA: Diagnosis not present

## 2017-09-15 DIAGNOSIS — D225 Melanocytic nevi of trunk: Secondary | ICD-10-CM | POA: Diagnosis not present

## 2017-09-15 DIAGNOSIS — L821 Other seborrheic keratosis: Secondary | ICD-10-CM | POA: Diagnosis not present

## 2017-09-15 DIAGNOSIS — Z85828 Personal history of other malignant neoplasm of skin: Secondary | ICD-10-CM | POA: Diagnosis not present

## 2017-09-22 DIAGNOSIS — E782 Mixed hyperlipidemia: Secondary | ICD-10-CM | POA: Diagnosis not present

## 2017-09-22 DIAGNOSIS — Z125 Encounter for screening for malignant neoplasm of prostate: Secondary | ICD-10-CM | POA: Diagnosis not present

## 2017-09-22 DIAGNOSIS — E119 Type 2 diabetes mellitus without complications: Secondary | ICD-10-CM | POA: Diagnosis not present

## 2017-09-22 DIAGNOSIS — E538 Deficiency of other specified B group vitamins: Secondary | ICD-10-CM | POA: Diagnosis not present

## 2017-09-29 DIAGNOSIS — Z0000 Encounter for general adult medical examination without abnormal findings: Secondary | ICD-10-CM | POA: Diagnosis not present

## 2017-09-29 DIAGNOSIS — E538 Deficiency of other specified B group vitamins: Secondary | ICD-10-CM | POA: Diagnosis not present

## 2017-09-29 DIAGNOSIS — E119 Type 2 diabetes mellitus without complications: Secondary | ICD-10-CM | POA: Diagnosis not present

## 2017-09-29 DIAGNOSIS — E782 Mixed hyperlipidemia: Secondary | ICD-10-CM | POA: Diagnosis not present

## 2017-10-02 DIAGNOSIS — H47233 Glaucomatous optic atrophy, bilateral: Secondary | ICD-10-CM | POA: Diagnosis not present

## 2017-10-02 DIAGNOSIS — H401122 Primary open-angle glaucoma, left eye, moderate stage: Secondary | ICD-10-CM | POA: Diagnosis not present

## 2017-10-02 DIAGNOSIS — H401132 Primary open-angle glaucoma, bilateral, moderate stage: Secondary | ICD-10-CM | POA: Diagnosis not present

## 2017-10-02 DIAGNOSIS — H401111 Primary open-angle glaucoma, right eye, mild stage: Secondary | ICD-10-CM | POA: Diagnosis not present

## 2017-10-02 DIAGNOSIS — H5203 Hypermetropia, bilateral: Secondary | ICD-10-CM | POA: Diagnosis not present

## 2017-10-02 DIAGNOSIS — Z961 Presence of intraocular lens: Secondary | ICD-10-CM | POA: Diagnosis not present

## 2017-10-02 DIAGNOSIS — H524 Presbyopia: Secondary | ICD-10-CM | POA: Diagnosis not present

## 2017-10-02 DIAGNOSIS — H52221 Regular astigmatism, right eye: Secondary | ICD-10-CM | POA: Diagnosis not present

## 2017-10-02 DIAGNOSIS — H43811 Vitreous degeneration, right eye: Secondary | ICD-10-CM | POA: Diagnosis not present

## 2017-12-06 DIAGNOSIS — M76899 Other specified enthesopathies of unspecified lower limb, excluding foot: Secondary | ICD-10-CM | POA: Diagnosis not present

## 2018-01-01 DIAGNOSIS — H401122 Primary open-angle glaucoma, left eye, moderate stage: Secondary | ICD-10-CM | POA: Diagnosis not present

## 2018-01-01 DIAGNOSIS — H524 Presbyopia: Secondary | ICD-10-CM | POA: Diagnosis not present

## 2018-01-01 DIAGNOSIS — H52221 Regular astigmatism, right eye: Secondary | ICD-10-CM | POA: Diagnosis not present

## 2018-01-01 DIAGNOSIS — H5203 Hypermetropia, bilateral: Secondary | ICD-10-CM | POA: Diagnosis not present

## 2018-01-01 DIAGNOSIS — Z961 Presence of intraocular lens: Secondary | ICD-10-CM | POA: Diagnosis not present

## 2018-01-01 DIAGNOSIS — H47233 Glaucomatous optic atrophy, bilateral: Secondary | ICD-10-CM | POA: Diagnosis not present

## 2018-01-01 DIAGNOSIS — H401111 Primary open-angle glaucoma, right eye, mild stage: Secondary | ICD-10-CM | POA: Diagnosis not present

## 2018-01-01 DIAGNOSIS — H43811 Vitreous degeneration, right eye: Secondary | ICD-10-CM | POA: Diagnosis not present

## 2018-01-08 DIAGNOSIS — M1712 Unilateral primary osteoarthritis, left knee: Secondary | ICD-10-CM | POA: Diagnosis not present

## 2018-01-08 DIAGNOSIS — M25562 Pain in left knee: Secondary | ICD-10-CM | POA: Diagnosis not present

## 2018-03-21 DIAGNOSIS — M7022 Olecranon bursitis, left elbow: Secondary | ICD-10-CM | POA: Diagnosis not present

## 2018-03-27 DIAGNOSIS — M7022 Olecranon bursitis, left elbow: Secondary | ICD-10-CM | POA: Diagnosis not present

## 2018-04-03 DIAGNOSIS — H401221 Low-tension glaucoma, left eye, mild stage: Secondary | ICD-10-CM | POA: Diagnosis not present

## 2018-04-03 DIAGNOSIS — Z961 Presence of intraocular lens: Secondary | ICD-10-CM | POA: Diagnosis not present

## 2018-04-03 DIAGNOSIS — H401212 Low-tension glaucoma, right eye, moderate stage: Secondary | ICD-10-CM | POA: Diagnosis not present

## 2018-04-04 DIAGNOSIS — E538 Deficiency of other specified B group vitamins: Secondary | ICD-10-CM | POA: Diagnosis not present

## 2018-04-04 DIAGNOSIS — E119 Type 2 diabetes mellitus without complications: Secondary | ICD-10-CM | POA: Diagnosis not present

## 2018-04-04 DIAGNOSIS — E782 Mixed hyperlipidemia: Secondary | ICD-10-CM | POA: Diagnosis not present

## 2018-04-09 DIAGNOSIS — E1151 Type 2 diabetes mellitus with diabetic peripheral angiopathy without gangrene: Secondary | ICD-10-CM | POA: Insufficient documentation

## 2018-04-09 DIAGNOSIS — R972 Elevated prostate specific antigen [PSA]: Secondary | ICD-10-CM | POA: Diagnosis not present

## 2018-04-09 DIAGNOSIS — E782 Mixed hyperlipidemia: Secondary | ICD-10-CM | POA: Diagnosis not present

## 2018-04-09 DIAGNOSIS — E538 Deficiency of other specified B group vitamins: Secondary | ICD-10-CM | POA: Diagnosis not present

## 2018-04-25 DIAGNOSIS — H401212 Low-tension glaucoma, right eye, moderate stage: Secondary | ICD-10-CM | POA: Diagnosis not present

## 2018-04-25 DIAGNOSIS — H401221 Low-tension glaucoma, left eye, mild stage: Secondary | ICD-10-CM | POA: Diagnosis not present

## 2018-04-25 DIAGNOSIS — Z961 Presence of intraocular lens: Secondary | ICD-10-CM | POA: Diagnosis not present

## 2018-06-09 ENCOUNTER — Observation Stay
Admission: EM | Admit: 2018-06-09 | Discharge: 2018-06-10 | Disposition: A | Discharge status: Home / Self Care | Payer: PPO | Attending: Internal Medicine | Admitting: Internal Medicine

## 2018-06-09 ENCOUNTER — Observation Stay: Payer: PPO

## 2018-06-09 ENCOUNTER — Other Ambulatory Visit: Payer: Self-pay

## 2018-06-09 ENCOUNTER — Emergency Department: Payer: PPO

## 2018-06-09 ENCOUNTER — Encounter: Payer: Self-pay | Admitting: Emergency Medicine

## 2018-06-09 DIAGNOSIS — I129 Hypertensive chronic kidney disease with stage 1 through stage 4 chronic kidney disease, or unspecified chronic kidney disease: Secondary | ICD-10-CM | POA: Diagnosis not present

## 2018-06-09 DIAGNOSIS — Z79899 Other long term (current) drug therapy: Secondary | ICD-10-CM | POA: Insufficient documentation

## 2018-06-09 DIAGNOSIS — N400 Enlarged prostate without lower urinary tract symptoms: Secondary | ICD-10-CM | POA: Diagnosis not present

## 2018-06-09 DIAGNOSIS — R202 Paresthesia of skin: Secondary | ICD-10-CM | POA: Diagnosis not present

## 2018-06-09 DIAGNOSIS — M6281 Muscle weakness (generalized): Secondary | ICD-10-CM | POA: Diagnosis not present

## 2018-06-09 DIAGNOSIS — E1122 Type 2 diabetes mellitus with diabetic chronic kidney disease: Secondary | ICD-10-CM | POA: Diagnosis not present

## 2018-06-09 DIAGNOSIS — G459 Transient cerebral ischemic attack, unspecified: Secondary | ICD-10-CM | POA: Diagnosis not present

## 2018-06-09 DIAGNOSIS — N183 Chronic kidney disease, stage 3 (moderate): Secondary | ICD-10-CM | POA: Insufficient documentation

## 2018-06-09 DIAGNOSIS — I639 Cerebral infarction, unspecified: Secondary | ICD-10-CM

## 2018-06-09 DIAGNOSIS — Z8673 Personal history of transient ischemic attack (TIA), and cerebral infarction without residual deficits: Secondary | ICD-10-CM | POA: Diagnosis not present

## 2018-06-09 DIAGNOSIS — R200 Anesthesia of skin: Secondary | ICD-10-CM

## 2018-06-09 DIAGNOSIS — R4781 Slurred speech: Secondary | ICD-10-CM | POA: Diagnosis not present

## 2018-06-09 DIAGNOSIS — I10 Essential (primary) hypertension: Secondary | ICD-10-CM | POA: Diagnosis not present

## 2018-06-09 LAB — COMPREHENSIVE METABOLIC PANEL
ALK PHOS: 49 U/L (ref 38–126)
ALT: 20 U/L (ref 0–44)
ANION GAP: 8 (ref 5–15)
AST: 23 U/L (ref 15–41)
Albumin: 3.6 g/dL (ref 3.5–5.0)
BUN: 14 mg/dL (ref 8–23)
CALCIUM: 8.6 mg/dL — AB (ref 8.9–10.3)
CO2: 29 mmol/L (ref 22–32)
CREATININE: 1.2 mg/dL (ref 0.61–1.24)
Chloride: 103 mmol/L (ref 98–111)
GFR, EST NON AFRICAN AMERICAN: 54 mL/min — AB (ref >60)
Glucose, Bld: 113 mg/dL — ABNORMAL HIGH (ref 70–99)
Potassium: 4.1 mmol/L (ref 3.5–5.1)
Sodium: 140 mmol/L (ref 135–145)
Total Bilirubin: 0.5 mg/dL (ref 0.3–1.2)
Total Protein: 7.3 g/dL (ref 6.5–8.1)

## 2018-06-09 LAB — TROPONIN I

## 2018-06-09 LAB — CBC
HCT: 38.8 % — ABNORMAL LOW (ref 39.0–52.0)
Hemoglobin: 13.2 g/dL (ref 13.0–17.0)
MCH: 31.7 pg (ref 26.0–34.0)
MCHC: 34 g/dL (ref 30.0–36.0)
MCV: 93 fL (ref 80.0–100.0)
PLATELETS: 200 10*3/uL (ref 150–400)
RBC: 4.17 MIL/uL — AB (ref 4.22–5.81)
RDW: 12.5 % (ref 11.5–15.5)
WBC: 7.3 10*3/uL (ref 4.0–10.5)
nRBC: 0 % (ref 0.0–0.2)

## 2018-06-09 LAB — LIPID PANEL
CHOL/HDL RATIO: 4.6 ratio
Cholesterol: 156 mg/dL (ref 0–200)
HDL: 34 mg/dL — ABNORMAL LOW (ref >40)
LDL CALC: 94 mg/dL (ref 0–99)
Triglycerides: 141 mg/dL (ref <150)
VLDL: 28 mg/dL (ref 0–40)

## 2018-06-09 LAB — GLUCOSE, CAPILLARY: Glucose-Capillary: 102 mg/dL — ABNORMAL HIGH (ref 70–99)

## 2018-06-09 MED ORDER — HEPARIN SODIUM (PORCINE) 5000 UNIT/ML IJ SOLN
5000.0000 [IU] | Freq: Three times a day (TID) | INTRAMUSCULAR | Status: DC
  Administered 2018-06-09 – 2018-06-10 (×2): 5000 [IU] via SUBCUTANEOUS
  Filled 2018-06-09 (×2): qty 1

## 2018-06-09 MED ORDER — SODIUM CHLORIDE 0.9 % IV SOLN
INTRAVENOUS | Status: DC
  Administered 2018-06-09: 21:00:00 via INTRAVENOUS

## 2018-06-09 MED ORDER — TAMSULOSIN HCL 0.4 MG PO CAPS
0.4000 mg | ORAL_CAPSULE | Freq: Two times a day (BID) | ORAL | Status: DC
  Administered 2018-06-10: 10:00:00 0.4 mg via ORAL
  Filled 2018-06-09: qty 1

## 2018-06-09 MED ORDER — SENNOSIDES-DOCUSATE SODIUM 8.6-50 MG PO TABS: 1.0000 | ORAL_TABLET | Freq: Every evening | ORAL | Status: DC | PRN

## 2018-06-09 MED ORDER — STROKE: EARLY STAGES OF RECOVERY BOOK
Freq: Once | Status: AC
  Administered 2018-06-10: 06:00:00 1

## 2018-06-09 MED ORDER — ACETAMINOPHEN 160 MG/5ML PO SOLN
650.0000 mg | ORAL | Status: DC | PRN
  Filled 2018-06-09: qty 20.3

## 2018-06-09 MED ORDER — ACETAMINOPHEN 325 MG PO TABS: 650.0000 mg | ORAL_TABLET | ORAL | Status: DC | PRN

## 2018-06-09 MED ORDER — CYANOCOBALAMIN 500 MCG PO TABS
250.0000 ug | ORAL_TABLET | Freq: Every day | ORAL | Status: DC
  Administered 2018-06-10: 250 ug via ORAL
  Filled 2018-06-09 (×2): qty 1

## 2018-06-09 MED ORDER — ACETAMINOPHEN 650 MG RE SUPP: 650.0000 mg | RECTAL | Status: DC | PRN

## 2018-06-09 MED ORDER — LATANOPROST 0.005 % OP SOLN
1.0000 [drp] | Freq: Every day | OPHTHALMIC | Status: DC
  Filled 2018-06-09: qty 2.5

## 2018-06-09 NOTE — ED Notes (Signed)
Pt transporting to MRI then room 128 1c after.

## 2018-06-09 NOTE — ED Provider Notes (Signed)
[facility] Emergency Department Provider Note  Time seen: 2:53 PM  I have reviewed the triage vital signs and the nursing notes.   HISTORY  Chief Complaint Numbness    HPI David Myers is a 84 y.o. male with a past medical history of diabetes, recent TIA 2 years ago, presents to the emergency department for left-sided weakness/numbness.  According to the patient at approximately 2 PM today he developed left arm numbness that then progressed to the face along with slurred speech and a left facial droop.  He states his symptoms lasted for approximately 15 minutes.  His wife called EMS gave the patient 4 baby aspirin.  Upon arrival to the emergency department by EMS patient states he is completely symptom-free, he is speaking clearly denies any numbness.  Denies any headache, chest pain or trouble breathing denies abdominal pain largely negative review of systems.  Patient states 2 years ago when he had a TIA he was having memory deficits which have since 100% resolved.   Past Medical History:  Diagnosis Date  . Diabetes mellitus without complication (HCC)    diet controlled  . History of hiatal hernia     There are no active problems to display for this patient.   Past Surgical History:  Procedure Laterality Date  . CATARACT EXTRACTION W/PHACO Left 08/12/2015   Procedure: CATARACT EXTRACTION PHACO AND INTRAOCULAR LENS PLACEMENT (IOC);  Surgeon: Leandrew Koyanagi, MD;  Location: [place];  Service: Ophthalmology;  Laterality: Left;  DIABETIC - diet controlled TORIC  . CATARACT EXTRACTION W/PHACO Right 09/09/2015   Procedure: CATARACT EXTRACTION PHACO AND INTRAOCULAR LENS PLACEMENT (IOC);  Surgeon: Leandrew Koyanagi, MD;  Location: [place];  Service: Ophthalmology;  Laterality: Right;  DIABETIC - diet controlled  . COLONOSCOPY    . WRIST SURGERY      Prior to Admission medications   Medication Sig Start Date End Date Taking?  Authorizing Provider  Cyanocobalamin (VITAMIN B12 PO) Take by mouth daily.    [provider]  ibuprofen (ADVIL,MOTRIN) 100 MG chewable tablet Chew 200 mg by mouth every 8 (eight) hours as needed.    [provider]  latanoprost (XALATAN) 0.005 % ophthalmic solution 1 drop at bedtime.    [provider]  Misc Natural Products (PROSTATE SUPPORT PO) Take by mouth daily.    [provider]  tamsulosin (FLOMAX) 0.4 MG CAPS capsule Take 0.4 mg by mouth 2 (two) times daily.    [provider]    Allergies  Allergen Reactions  . Augmentin [Amoxicillin-Pot Clavulanate] Diarrhea    History reviewed. No pertinent family history.  Social History Social History   Tobacco Use  . Smoking status: Never Smoker  Substance Use Topics  . Alcohol use: No  . Drug use: Not on file    Review of Systems Constitutional: Negative for fever. Cardiovascular: Negative for chest pain. Respiratory: Negative for shortness of breath. Gastrointestinal: Negative for abdominal pain, vomiting Genitourinary: Negative for urinary compaints Musculoskeletal: Negative for musculoskeletal complaints Neurological: Negative for headache.  Positive for numbness of the left upper extremity, numbness/weakness of the left face all of which is since resolved. All other ROS negative  ____________________________________________   PHYSICAL EXAM:  VITAL SIGNS: ED Triage Vitals  Enc Vitals Group     BP 06/09/18 1442 (!) 167/95     Pulse Rate 06/09/18 1442 80     Resp 06/09/18 1442 15     Temp 06/09/18 1449 98.1 F (36.7 C)  Temp Source 06/09/18 1449 Oral     SpO2 06/09/18 1442 95 %     Weight 06/09/18 1442 179 lb 14.3 oz (81.6 kg)     Height 06/09/18 1442 6' (1.829 m)     Head Circumference --      Peak Flow --      Pain Score 06/09/18 1442 0     Pain Loc --      Pain Edu? --      Excl. in [place]? --    Constitutional: Alert and oriented. Well appearing and in no  distress. Eyes: Normal exam ENT   Head: Normocephalic and atraumatic.   Mouth/Throat: Mucous membranes are moist. Cardiovascular: Normal rate, regular rhythm. No murmur Respiratory: Normal respiratory effort without tachypnea nor retractions. Breath sounds are clear Gastrointestinal: Soft and nontender. No distention. Musculoskeletal: Nontender with normal range of motion in all extremities.  Neurologic:  Normal speech and language. No gross focal neurologic deficits Skin:  Skin is warm, dry and intact.  Psychiatric: Mood and affect are normal.   ____________________________________________    EKG  EKG reviewed and interpreted by myself shows a sinus rhythm at 79 bpm with a narrow QRS, normal axis, largely normal intervals, no concerning ST changes  ____________________________________________    RADIOLOGY  CT negative for acute abnormality  ____________________________________________   INITIAL IMPRESSION / ASSESSMENT AND PLAN / ED COURSE  Pertinent labs & imaging results that were available during my care of the patient were reviewed by me and considered in my medical decision making (see chart for details).  Patient presents to the emergency department for numbness of his left upper extremity numbness weakness to the left face and slurred speech beginning at 2 PM resolving within a partially 15 minutes per patient.  Patient is asymptomatic at this time with a NIH stroke scale of 0.  However patient's story is very concerning for TIA/mini stroke.  We will check labs, CT scan of the head, EKG.  For the concerning nature of the patient's story anticipate likely admission to the hospital for TIA work-up.  I discussed this with the patient he is agreeable to this plan of care.  Patient CT scan is negative, labs are normal.  Patient symptoms have completely resolved story is very concerning for TIA.  Patient will be admitted to hospitalist service for continued work-up and  treatment.  Patient agreeable to plan of care.  ____________________________________________   FINAL CLINICAL IMPRESSION(S) / ED DIAGNOSES  Transient ischemic attack   Harvest Dark, MD 06/09/18 1553

## 2018-06-09 NOTE — ED Notes (Signed)
Pt transported to CT ?

## 2018-06-09 NOTE — ED Notes (Signed)
Pt returned from CT scan.

## 2018-06-09 NOTE — ED Notes (Signed)
ED Provider at bedside. 

## 2018-06-09 NOTE — ED Triage Notes (Signed)
Pt presents to ED via [facility] with c/o numbness on left side of face. Wife noticed drooping in pt's left side of face. Numbness in left hand as well. Hx of TIA 3 years ago. BS 160. Pt currently denying any weakness, numbness. No slurred speech noted at this time. Pt alert and oriented x4. 20G in left ac.

## 2018-06-09 NOTE — H&P (Signed)
[facility] NAME: David Myers    MR#:  [id number]  DATE OF BIRTH:  01/03/1932  DATE OF ADMISSION:  06/09/2018  PRIMARY CARE PHYSICIAN: Rusty Aus, MD   REQUESTING/REFERRING PHYSICIAN: [email or web address]  CHIEF COMPLAINT:   Chief Complaint  Patient presents with  . Numbness    HISTORY OF PRESENT ILLNESS: David Myers  is a 84 y.o. male with a known history of diabetes but diet controlled, hypertension in past but taken off the medication because of normal blood pressure.  Was having numbness in his left forearm and left side of face along with some speech difficulties today morning.  Concerned with this his wife gave him 4 tablets of baby aspirin and brought him to emergency room. His CT scan of the head initially is negative.  But advised to admit to medical service for further management. Patient symptoms lasted for 15 to 20 minutes and currently he does not have any symptoms.  PAST MEDICAL HISTORY:   Past Medical History:  Diagnosis Date  . Diabetes mellitus without complication (HCC)    diet controlled  . History of hiatal hernia     PAST SURGICAL HISTORY:  Past Surgical History:  Procedure Laterality Date  . CATARACT EXTRACTION W/PHACO Left 08/12/2015   Procedure: CATARACT EXTRACTION PHACO AND INTRAOCULAR LENS PLACEMENT (IOC);  Surgeon: Leandrew Koyanagi, MD;  Location: [facility];  Service: Ophthalmology;  Laterality: Left;  DIABETIC - diet controlled TORIC  . CATARACT EXTRACTION W/PHACO Right 09/09/2015   Procedure: CATARACT EXTRACTION PHACO AND INTRAOCULAR LENS PLACEMENT (IOC);  Surgeon: Leandrew Koyanagi, MD;  Location: [facility];  Service: Ophthalmology;  Laterality: Right;  DIABETIC - diet controlled  . COLONOSCOPY    . WRIST SURGERY      SOCIAL HISTORY:  Social History   Tobacco Use  . Smoking status: Never Smoker  Substance Use Topics  . Alcohol use: No    FAMILY HISTORY:  Family  History  Problem Relation Age of Onset  . Hypertension Mother     DRUG ALLERGIES:  Allergies  Allergen Reactions  . Augmentin [Amoxicillin-Pot Clavulanate] Diarrhea    REVIEW OF SYSTEMS:   CONSTITUTIONAL: No fever, fatigue or weakness.  EYES: No blurred or double vision.  EARS, NOSE, AND THROAT: No tinnitus or ear pain.  RESPIRATORY: No cough, shortness of breath, wheezing or hemoptysis.  CARDIOVASCULAR: No chest pain, orthopnea, edema.  GASTROINTESTINAL: No nausea, vomiting, diarrhea or abdominal pain.  GENITOURINARY: No dysuria, hematuria.  ENDOCRINE: No polyuria, nocturia,  HEMATOLOGY: No anemia, easy bruising or bleeding SKIN: No rash or lesion. MUSCULOSKELETAL: No joint pain or arthritis.   NEUROLOGIC: No tingling, numbness, weakness.  PSYCHIATRY: No anxiety or depression.   MEDICATIONS AT HOME:  Prior to Admission medications   Medication Sig Start Date End Date Taking? Authorizing Provider  latanoprost (XALATAN) 0.005 % ophthalmic solution Place 1 drop into both eyes at bedtime.    Yes [provider]  tamsulosin (FLOMAX) 0.4 MG CAPS capsule Take 0.4 mg by mouth daily.    Yes [provider]  Cyanocobalamin (VITAMIN B12 PO) Take by mouth daily.    [provider]  ibuprofen (ADVIL,MOTRIN) 100 MG chewable tablet Chew 200 mg by mouth every 8 (eight) hours as needed.    [provider]  Misc Natural Products (PROSTATE SUPPORT PO) Take by mouth daily.    [provider]      PHYSICAL EXAMINATION:   VITAL SIGNS: Blood pressure Marland Kitchen)  144/85, pulse 64, temperature 98.1 F (36.7 C), temperature source Oral, resp. rate 17, height 6' (1.829 m), weight 81.6 kg, SpO2 94 %.  GENERAL:  84 y.o.-year-old patient lying in the bed with no acute distress.  EYES: Pupils equal, round, reactive to light and accommodation. No scleral icterus. Extraocular muscles intact.  HEENT: Head atraumatic, normocephalic. Oropharynx and nasopharynx clear.   NECK:  Supple, no jugular venous distention. No thyroid enlargement, no tenderness.  LUNGS: Normal breath sounds bilaterally, no wheezing, rales,rhonchi or crepitation. No use of accessory muscles of respiration.  CARDIOVASCULAR: S1, S2 normal. No murmurs, rubs, or gallops.  ABDOMEN: Soft, nontender, nondistended. Bowel sounds present. No organomegaly or mass.  EXTREMITIES: No pedal edema, cyanosis, or clubbing.  NEUROLOGIC: Cranial nerves II through XII are intact. Muscle strength 5/5 in all extremities. Sensation intact. Gait not checked.  PSYCHIATRIC: The patient is alert and oriented x 3.  SKIN: No obvious rash, lesion, or ulcer.   LABORATORY PANEL:   CBC Recent Labs  Lab 06/09/18 1453  WBC 7.3  HGB 13.2  HCT 38.8*  PLT 200  MCV 93.0  MCH 31.7  MCHC 34.0  RDW 12.5   ------------------------------------------------------------------------------------------------------------------  Chemistries  Recent Labs  Lab 06/09/18 1453  NA 140  K 4.1  CL 103  CO2 29  GLUCOSE 113*  BUN 14  CREATININE 1.20  CALCIUM 8.6*  AST 23  ALT 20  ALKPHOS 49  BILITOT 0.5   ------------------------------------------------------------------------------------------------------------------ estimated creatinine clearance is 50.3 mL/min (by C-G formula based on SCr of 1.2 mg/dL). ------------------------------------------------------------------------------------------------------------------ No results for input(s): TSH, T4TOTAL, T3FREE, THYROIDAB in the last 72 hours.  Invalid input(s): FREET3   Coagulation profile No results for input(s): INR, PROTIME in the last 168 hours. ------------------------------------------------------------------------------------------------------------------- No results for input(s): DDIMER in the last 72 hours. -------------------------------------------------------------------------------------------------------------------  Cardiac Enzymes Recent  Labs  Lab 06/09/18 1453  TROPONINI <0.03   ------------------------------------------------------------------------------------------------------------------ Invalid input(s): POCBNP  ---------------------------------------------------------------------------------------------------------------  Urinalysis No results found for: COLORURINE, APPEARANCEUR, LABSPEC, PHURINE, GLUCOSEU, HGBUR, BILIRUBINUR, KETONESUR, PROTEINUR, UROBILINOGEN, NITRITE, LEUKOCYTESUR   RADIOLOGY: Ct Head Wo Contrast  Result Date: 06/09/2018 CLINICAL DATA:  Patient with numbness along the left side of the face. EXAM: CT HEAD WITHOUT CONTRAST TECHNIQUE: Contiguous axial images were obtained from the base of the skull through the vertex without intravenous contrast. COMPARISON:  Brain CT 03/29/2005 FINDINGS: Brain: Ventricles and sulci are appropriate for patient's age. Periventricular and subcortical white matter hypodensity compatible with chronic microvascular ischemic changes. No evidence for acute cortically based infarct, intracranial hemorrhage, mass lesion or mass-effect. Vascular: Unremarkable Skull: Intact. Sinuses/Orbits: Paranasal sinuses are well aerated. Mastoid air cells are unremarkable. Orbits are unremarkable. Other: None. IMPRESSION: No acute intracranial process. Atrophy and chronic microvascular ischemic changes. Electronically Signed   By: Lovey Newcomer M.D.   On: 06/09/2018 15:35    EKG: Orders placed or performed during the hospital encounter of 06/09/18  . ED EKG  . ED EKG  . EKG 12-Lead  . EKG 12-Lead    IMPRESSION AND PLAN:  *TIA Patient had numbness symptoms with some speech difficulties but it is completely back to normal now. Will admit for stroke work-up and keep on cardiac monitor. Check lipid panel and hemoglobin A1c. Neurology consult.  Physical therapy consult. MRI and MRA on brain, echocardiogram, carotid Doppler study. Patient already received aspirin today by his wife.   Would wait for further recommendation by neurologist after the results.  *Uncontrolled hypertension Currently blood pressure is slightly high, I would not start  on any medication to allow permissive hypertension if it is a stroke.  *Chronic kidney disease stage III Appears stable, continue to monitor.   All the records are reviewed and case discussed with ED provider. Management plans discussed with the patient, family and they are in agreement.  CODE STATUS: Full code.  Discussed with patient's wife and daughter in the room.  TOTAL TIME TAKING CARE OF THIS PATIENT: 50 minutes.    Vaughan Basta M.D on 06/09/2018   Between 7am to 6pm - Pager - [phone]  After 6pm go to [email or web address] - password [email or web address] [facility]  Office  [phone]  CC: Primary care physician; Rusty Aus, MD   Note: This dictation was prepared with Dragon dictation along with smaller phrase technology. Any transcriptional errors that result from this process are unintentional.

## 2018-06-10 ENCOUNTER — Observation Stay: Payer: PPO

## 2018-06-10 ENCOUNTER — Observation Stay
Admit: 2018-06-10 | Discharge: 2018-06-10 | Disposition: A | Discharge status: Home / Self Care | Payer: PPO | Attending: Internal Medicine | Admitting: Internal Medicine

## 2018-06-10 DIAGNOSIS — R200 Anesthesia of skin: Secondary | ICD-10-CM

## 2018-06-10 DIAGNOSIS — I6523 Occlusion and stenosis of bilateral carotid arteries: Secondary | ICD-10-CM | POA: Diagnosis not present

## 2018-06-10 DIAGNOSIS — I10 Essential (primary) hypertension: Secondary | ICD-10-CM | POA: Diagnosis not present

## 2018-06-10 DIAGNOSIS — N183 Chronic kidney disease, stage 3 (moderate): Secondary | ICD-10-CM | POA: Diagnosis not present

## 2018-06-10 DIAGNOSIS — R202 Paresthesia of skin: Secondary | ICD-10-CM | POA: Diagnosis not present

## 2018-06-10 DIAGNOSIS — G459 Transient cerebral ischemic attack, unspecified: Secondary | ICD-10-CM | POA: Diagnosis not present

## 2018-06-10 LAB — HEMOGLOBIN A1C
HEMOGLOBIN A1C: 7.1 % — AB (ref 4.8–5.6)
MEAN PLASMA GLUCOSE: 157.07 mg/dL

## 2018-06-10 NOTE — Evaluation (Signed)
Physical Therapy Evaluation Patient Details Name: David Myers MRN: [id number] DOB: 11-Apr-1932 Today's Date: 06/10/2018   History of Present Illness  84 y.o. male with a known history of diabetes but diet controlled, hypertension in past but taken off the medication because of normal blood pressure.  Was having numbness in his left forearm and left side of face along with some speech difficulties.  Symptoms has resolved and pt is feeling back to baseline. `  Clinical Impression  Pt did well with PT and shows not residual weakness, coordination, safety or other issues related to recent stroke-like symptoms. He was able to easily ambulate w/o AD, negotiate steps and feels confident about going home.  Wife present and agrees, no further PT needs at this time.    Follow Up Recommendations No PT follow up    Equipment Recommendations  None recommended by PT    Recommendations for Other Services       Precautions / Restrictions Precautions Precautions: Fall Restrictions Weight Bearing Restrictions: No      Mobility  Bed Mobility Overal bed mobility: Independent                Transfers Overall transfer level: Independent Equipment used: None             General transfer comment: Pt easily gets to standing and is able to maintain balance  Ambulation/Gait Ambulation/Gait assistance: Independent Gait Distance (Feet): 150 Feet Assistive device: None       General Gait Details: Pt with appropriate speed, cadence, good balance, etc.  No issues, at baseline.   Stairs Stairs: Yes Stairs assistance: Independent Stair Management: One rail Right Number of Stairs: 5 General stair comments: Pt easily negotiates up/down steps w/o issue  Wheelchair Mobility    Modified Rankin (Stroke Patients Only)       Balance Overall balance assessment: Independent                                           Pertinent Vitals/Pain Pain Assessment:  No/denies pain    Home Living Family/patient expects to be discharged to:: Private residence   Available Help at Discharge: Family Type of Home: House Home Access: Stairs to enter Entrance Stairs-Rails: Right Entrance Stairs-Number of Steps: 3 Home Layout: Two level Home Equipment: None      Prior Function Level of Independence: Independent         Comments: Pt very active, still runs rental properties, works in yard, Comptroller        Extremity/Trunk Assessment   Upper Extremity Assessment Upper Extremity Assessment: Overall WFL for tasks assessed    Lower Extremity Assessment Lower Extremity Assessment: Overall WFL for tasks assessed       Communication   Communication: No difficulties  Cognition Arousal/Alertness: Awake/alert Behavior During Therapy: WFL for tasks assessed/performed Overall Cognitive Status: Within Functional Limits for tasks assessed                                        General Comments      Exercises     Assessment/Plan    PT Assessment Patent does not need any further PT services  PT Problem List         PT Treatment Interventions  PT Goals (Current goals can be found in the Care Plan section)  Acute Rehab PT Goals Patient Stated Goal: go home PT Goal Formulation: All assessment and education complete, DC therapy    Frequency     Barriers to discharge        Co-evaluation               AM-PAC PT "6 Clicks" Daily Activity  Outcome Measure Difficulty turning over in bed (including adjusting bedclothes, sheets and blankets)?: None Difficulty moving from lying on back to sitting on the side of the bed? : None Difficulty sitting down on and standing up from a chair with arms (e.g., wheelchair, bedside commode, etc,.)?: None Help needed moving to and from a bed to chair (including a wheelchair)?: None Help needed walking in hospital room?: None Help needed climbing 3-5 steps with  a railing? : None 6 Click Score: 24    End of Session Equipment Utilized During Treatment: Gait belt Activity Tolerance: Patient tolerated treatment well Patient left: in chair;with call bell/phone within reach;with family/visitor present Nurse Communication: Mobility status PT Visit Diagnosis: Other symptoms and signs involving the nervous system (R29.898)    Time: 8333-8329 PT Time Calculation (min) (ACUTE ONLY): 12 min   Charges:   PT Evaluation $PT Eval Low Complexity: 1 Low          Kreg Shropshire, DPT 06/10/2018, 10:58 AM

## 2018-06-10 NOTE — Progress Notes (Signed)
Patient denied any acute pain. Wife at bedside and updated on care plan. Full assessment to epic completed. Patient oriented to his room/ascom/call bell and staff. Will continue to monitor.

## 2018-06-10 NOTE — Progress Notes (Signed)
Transported to private vehicle in transport chair by volunteer. Discharge home with family/self care with [facility] services.

## 2018-06-10 NOTE — Plan of Care (Signed)
Pt stroke symptoms has resolved. Pt spouse refuse bed alarm because she is at bedside. Pt spouse report pt is borderline diabetic and is not on any diabetic medication currently. No other signs of distress noted. Will continue to monitor.

## 2018-06-10 NOTE — Care Management Obs Status (Signed)
[facility] NOTIFICATION   Patient Details  Name: David Myers MRN: [id number] Date of Birth: 1931/08/11   Medicare Observation Status Notification Given:  Yes    Kevionna Heffler A Miria Cappelli, RN 06/10/2018, 1:58 PM

## 2018-06-10 NOTE — Progress Notes (Signed)
Instructed to start baby aspirin today at home and states they will do this.

## 2018-06-10 NOTE — Consult Note (Signed)
Reason for Consult:L sided numbness  Referring Physician: Dr. Vianne Bulls   CC: L sided numbness   HPI: David Myers is an 84 y.o. male with a known history of diabetes but diet controlled, hypertension in past but taken off the medication because of normal blood pressure.  Was having numbness in his left forearm and left side of face along with some speech difficulties yesterday.  Concerned with this his wife gave him 4 tablets of baby aspirin and brought him to emergency room. Symptoms resolved and pt is back to baseline.    Past Medical History:  Diagnosis Date  . Diabetes mellitus without complication (HCC)    diet controlled  . History of hiatal hernia     Past Surgical History:  Procedure Laterality Date  . CATARACT EXTRACTION W/PHACO Left 08/12/2015   Procedure: CATARACT EXTRACTION PHACO AND INTRAOCULAR LENS PLACEMENT (IOC);  Surgeon: Leandrew Koyanagi, MD;  Location: [facility];  Service: Ophthalmology;  Laterality: Left;  DIABETIC - diet controlled TORIC  . CATARACT EXTRACTION W/PHACO Right 09/09/2015   Procedure: CATARACT EXTRACTION PHACO AND INTRAOCULAR LENS PLACEMENT (IOC);  Surgeon: Leandrew Koyanagi, MD;  Location: [facility];  Service: Ophthalmology;  Laterality: Right;  DIABETIC - diet controlled  . COLONOSCOPY    . WRIST SURGERY      Family History  Problem Relation Age of Onset  . Hypertension Mother     Social History:  reports that he has never smoked. He has never used smokeless tobacco. He reports that he does not drink alcohol. His drug history is not on file.  Allergies  Allergen Reactions  . Augmentin [Amoxicillin-Pot Clavulanate] Diarrhea    Led to C-diff     Medications: I have reviewed the patient's current medications.  ROS: History obtained from the patient  General ROS: negative for - chills, fatigue, fever, night sweats, weight gain or weight loss Psychological ROS: negative for - behavioral disorder,  hallucinations, memory difficulties, mood swings or suicidal ideation Ophthalmic ROS: negative for - blurry vision, double vision, eye pain or loss of vision ENT ROS: negative for - epistaxis, nasal discharge, oral lesions, sore throat, tinnitus or vertigo Allergy and Immunology ROS: negative for - hives or itchy/watery eyes Hematological and Lymphatic ROS: negative for - bleeding problems, bruising or swollen lymph nodes Endocrine ROS: negative for - galactorrhea, hair pattern changes, polydipsia/polyuria or temperature intolerance Respiratory ROS: negative for - cough, hemoptysis, shortness of breath or wheezing Cardiovascular ROS: negative for - chest pain, dyspnea on exertion, edema or irregular heartbeat Gastrointestinal ROS: negative for - abdominal pain, diarrhea, hematemesis, nausea/vomiting or stool incontinence Genito-Urinary ROS: negative for - dysuria, hematuria, incontinence or urinary frequency/urgency Musculoskeletal ROS: negative for - joint swelling or muscular weakness Neurological ROS: as noted in HPI Dermatological ROS: negative for rash and skin lesion changes  Physical Examination: Blood pressure 118/67, pulse 90, temperature 97.7 F (36.5 C), temperature source Oral, resp. rate 20, height 6' (1.829 m), weight 81.6 kg, SpO2 92 %.Neurological Examination   Mental Status: Alert, oriented, thought content appropriate.  Speech fluent without evidence of aphasia.  Able to follow 3 step commands without difficulty. Cranial Nerves: II: Discs flat bilaterally; Visual fields grossly normal, pupils equal, round, reactive to light and accommodation III,IV, VI: ptosis not present, extra-ocular motions intact bilaterally V,VII: smile symmetric, facial light touch sensation normal bilaterally VIII: hearing normal bilaterally IX,X: gag reflex present XI: bilateral shoulder shrug XII: midline tongue extension Motor: Right : Upper extremity   5/5  Left:     Upper extremity    5/5  Lower extremity   5/5     Lower extremity   5/5 Tone and bulk:normal tone throughout; no atrophy noted Sensory: Pinprick and light touch intact throughout, bilaterally Deep Tendon Reflexes: 2+ and symmetric throughout Plantars: Right: downgoing   Left: downgoing Cerebellar: normal finger-to-nose, normal rapid alternating movements and normal heel-to-shin test Gait: not tested       Laboratory Studies:   Basic Metabolic Panel: Recent Labs  Lab 06/09/18 1453  NA 140  K 4.1  CL 103  CO2 29  GLUCOSE 113*  BUN 14  CREATININE 1.20  CALCIUM 8.6*    Liver Function Tests: Recent Labs  Lab 06/09/18 1453  AST 23  ALT 20  ALKPHOS 49  BILITOT 0.5  PROT 7.3  ALBUMIN 3.6   No results for input(s): LIPASE, AMYLASE in the last 168 hours. No results for input(s): AMMONIA in the last 168 hours.  CBC: Recent Labs  Lab 06/09/18 1453  WBC 7.3  HGB 13.2  HCT 38.8*  MCV 93.0  PLT 200    Cardiac Enzymes: Recent Labs  Lab 06/09/18 1453  TROPONINI <0.03    BNP: Invalid input(s): POCBNP  CBG: Recent Labs  Lab 06/09/18 1443  GLUCAP 102*    Microbiology: No results found for this or any previous visit.  Coagulation Studies: No results for input(s): LABPROT, INR in the last 72 hours.  Urinalysis: No results for input(s): COLORURINE, LABSPEC, PHURINE, GLUCOSEU, HGBUR, BILIRUBINUR, KETONESUR, PROTEINUR, UROBILINOGEN, NITRITE, LEUKOCYTESUR in the last 168 hours.  Invalid input(s): APPERANCEUR  Lipid Panel:     Component Value Date/Time   CHOL 156 06/09/2018 1453   TRIG 141 06/09/2018 1453   HDL 34 (L) 06/09/2018 1453   CHOLHDL 4.6 06/09/2018 1453   VLDL 28 06/09/2018 1453   LDLCALC 94 06/09/2018 1453    HgbA1C:  Lab Results  Component Value Date   HGBA1C 7.1 (H) 06/09/2018    Urine Drug Screen:  No results found for: LABOPIA, COCAINSCRNUR, LABBENZ, AMPHETMU, THCU, LABBARB  Alcohol Level: No results for input(s): ETH in the last 168 hours.  Other  results: EKG: normal EKG, normal sinus rhythm, unchanged from previous tracings.  Imaging: Ct Head Wo Contrast  Result Date: 06/09/2018 CLINICAL DATA:  Patient with numbness along the left side of the face. EXAM: CT HEAD WITHOUT CONTRAST TECHNIQUE: Contiguous axial images were obtained from the base of the skull through the vertex without intravenous contrast. COMPARISON:  Brain CT 03/29/2005 FINDINGS: Brain: Ventricles and sulci are appropriate for patient's age. Periventricular and subcortical white matter hypodensity compatible with chronic microvascular ischemic changes. No evidence for acute cortically based infarct, intracranial hemorrhage, mass lesion or mass-effect. Vascular: Unremarkable Skull: Intact. Sinuses/Orbits: Paranasal sinuses are well aerated. Mastoid air cells are unremarkable. Orbits are unremarkable. Other: None. IMPRESSION: No acute intracranial process. Atrophy and chronic microvascular ischemic changes. Electronically Signed   By: Lovey Newcomer M.D.   On: 06/09/2018 15:35   Mr Brain Wo Contrast  Result Date: 06/09/2018 CLINICAL DATA:  LEFT-sided weakness and numbness. Slurred speech. LEFT facial droop. Patient now symptom free, history of TIA. EXAM: MRI HEAD WITHOUT CONTRAST MRA HEAD WITHOUT CONTRAST TECHNIQUE: Multiplanar, multiecho pulse sequences of the brain and surrounding structures were obtained without intravenous contrast. Angiographic images of the head were obtained using MRA technique without contrast. COMPARISON:  CT head earlier today.  MR head 10/01/2015. FINDINGS: MRI HEAD FINDINGS Brain: No evidence for acute infarction, hemorrhage,  mass lesion, hydrocephalus, or extra-axial fluid. Generalized cerebral and cerebellar atrophy. T2 and FLAIR hyperintensities in the white matter, both focal and confluent, most consistent with chronic microvascular ischemic change. Marked prominence perivascular spaces most commonly associated with hypertension. Vascular: Reported  separately. Skull and upper cervical spine: Unremarkable marrow signal. Cervical spondylosis. Sinuses/Orbits: No layering sinus fluid. BILATERAL cataract extraction but no orbital findings. Other: None. The previous small meningioma identified on the prior exam over the RIGHT frontal convexity is difficult to visualize on today's noncontrast study no significant interval growth has occurred. MRA HEAD FINDINGS The internal carotid arteries are widely patent. Basilar artery is widely patent with both vertebrals contributing, LEFT dominant. No intracranial stenosis, occlusion, or saccular aneurysm. IMPRESSION: MRI brain demonstrates chronic changes of atrophy and small vessel disease, but no acute intracranial findings. Specifically no acute stroke. MRA intracranial demonstrates no flow reducing stenosis, dissection, or vascular occlusion. Electronically Signed   By: Staci Righter M.D.   On: 06/09/2018 19:07   US Carotid Bilateral (at [facility] Only)  Result Date: 06/10/2018 CLINICAL DATA:  84 year old male with stroke-like symptoms including left-sided numbness, weakness, slurred speech and facial droop. EXAM: BILATERAL CAROTID DUPLEX ULTRASOUND TECHNIQUE: Pearline Cables scale imaging, color Doppler and duplex ultrasound were performed of bilateral carotid and vertebral arteries in the neck. COMPARISON:  Prior duplex carotid ultrasound 10/01/2015 FINDINGS: Criteria: Quantification of carotid stenosis is based on velocity parameters that correlate the residual internal carotid diameter with NASCET-based stenosis levels, using the diameter of the distal internal carotid lumen as the denominator for stenosis measurement. The following velocity measurements were obtained: RIGHT ICA: 71/19 cm/sec CCA: 417/40 cm/sec SYSTOLIC ICA/CCA RATIO:  0.7 ECA:  88 cm/sec LEFT ICA: 85/25 cm/sec CCA: 81/44 cm/sec SYSTOLIC ICA/CCA RATIO:  1.0 ECA:  98 cm/sec RIGHT CAROTID ARTERY: Trace smooth heterogeneous atherosclerotic plaque in the  proximal internal carotid artery. By peak systolic velocity criteria, the estimated stenosis remains less than 50%. RIGHT VERTEBRAL ARTERY:  Patent with normal antegrade flow. LEFT CAROTID ARTERY: Trace focal heterogeneous atherosclerotic plaque in the proximal internal carotid artery. By peak systolic velocity criteria, the estimated stenosis remains less than 50%. LEFT VERTEBRAL ARTERY:  Patent with normal antegrade flow. IMPRESSION: 1. Mild (1-49%) stenosis proximal right internal carotid artery secondary to trace smooth atherosclerotic plaque. 2. Mild (1-49%) stenosis proximal left internal carotid artery secondary to trace focal atherosclerotic plaque. 3. No significant interval progression of carotid artery disease when compared to prior imaging from 10/01/2015. 4. Vertebral arteries remain patent with normal antegrade flow. Electronically Signed   By: Jacqulynn Cadet M.D.   On: 06/10/2018 08:45   Mr Jodene Nam Head/brain YJ Cm  Result Date: 06/09/2018 CLINICAL DATA:  LEFT-sided weakness and numbness. Slurred speech. LEFT facial droop. Patient now symptom free, history of TIA. EXAM: MRI HEAD WITHOUT CONTRAST MRA HEAD WITHOUT CONTRAST TECHNIQUE: Multiplanar, multiecho pulse sequences of the brain and surrounding structures were obtained without intravenous contrast. Angiographic images of the head were obtained using MRA technique without contrast. COMPARISON:  CT head earlier today.  MR head 10/01/2015. FINDINGS: MRI HEAD FINDINGS Brain: No evidence for acute infarction, hemorrhage, mass lesion, hydrocephalus, or extra-axial fluid. Generalized cerebral and cerebellar atrophy. T2 and FLAIR hyperintensities in the white matter, both focal and confluent, most consistent with chronic microvascular ischemic change. Marked prominence perivascular spaces most commonly associated with hypertension. Vascular: Reported separately. Skull and upper cervical spine: Unremarkable marrow signal. Cervical spondylosis.  Sinuses/Orbits: No layering sinus fluid. BILATERAL cataract extraction but no orbital  findings. Other: None. The previous small meningioma identified on the prior exam over the RIGHT frontal convexity is difficult to visualize on today's noncontrast study no significant interval growth has occurred. MRA HEAD FINDINGS The internal carotid arteries are widely patent. Basilar artery is widely patent with both vertebrals contributing, LEFT dominant. No intracranial stenosis, occlusion, or saccular aneurysm. IMPRESSION: MRI brain demonstrates chronic changes of atrophy and small vessel disease, but no acute intracranial findings. Specifically no acute stroke. MRA intracranial demonstrates no flow reducing stenosis, dissection, or vascular occlusion. Electronically Signed   By: Staci Righter M.D.   On: 06/09/2018 19:07     Assessment/Plan:  84 y.o. male with a known history of diabetes but diet controlled, hypertension in past but taken off the medication because of normal blood pressure.  Was having numbness in his left forearm and left side of face along with some speech difficulties yesterday.  Concerned with this his wife gave him 4 tablets of baby aspirin and brought him to emergency room. Symptoms resolved and pt is back to baseline.   - No abnormalities on MRI/MRA - Pt is not compliant with ASA at home  - Start ASA 81 daily - d/c planning today 06/10/2018, 1:27 PM

## 2018-06-10 NOTE — Progress Notes (Signed)
Stroke workup completed. NIH-0. Reports feels back to baseline. Oral and written stroke education completed. Wife active in pt care at bedside. Pt refused bed alarm with education reviewed.

## 2018-06-13 LAB — ECHOCARDIOGRAM COMPLETE
Height: 72 in
WEIGHTICAEL: 2878.33 [oz_av]

## 2018-06-14 NOTE — Discharge Summary (Signed)
David Myers, is a 84 y.o. male  DOB [birth year]  MRN [id number].  Admission date:  06/09/2018  Admitting Physician  Vaughan Basta, MD  Discharge Date:  06/10/2018   Primary MD  Rusty Aus, MD  Recommendations for primary care physician for things to follow:   Follow-up with PCP in 1 week   Admission Diagnosis  TIA (transient ischemic attack) [G45.9] Stroke [facility]) [I63.9]   Discharge Diagnosis  TIA (transient ischemic attack) [G45.9] Stroke [facility]) [I63.9]    Principal Problem:   Numbness and tingling Active Problems:   TIA (transient ischemic attack)      Past Medical History:  Diagnosis Date  . Diabetes mellitus without complication (HCC)    diet controlled  . History of hiatal hernia     Past Surgical History:  Procedure Laterality Date  . CATARACT EXTRACTION W/PHACO Left 08/12/2015   Procedure: CATARACT EXTRACTION PHACO AND INTRAOCULAR LENS PLACEMENT (IOC);  Surgeon: Leandrew Koyanagi, MD;  Location: [place];  Service: Ophthalmology;  Laterality: Left;  DIABETIC - diet controlled TORIC  . CATARACT EXTRACTION W/PHACO Right 09/09/2015   Procedure: CATARACT EXTRACTION PHACO AND INTRAOCULAR LENS PLACEMENT (IOC);  Surgeon: Leandrew Koyanagi, MD;  Location: [place];  Service: Ophthalmology;  Laterality: Right;  DIABETIC - diet controlled  . COLONOSCOPY    . WRIST SURGERY         History of present illness and  Hospital Course:     Kindly see H&P for history of present illness and admission details, please review complete Labs, Consult reports and Test reports for all details in brief  HPI  from the history and physical done on the day of admission 84 year old male with diet-controlled diabetes, hypertension came in because of numbness of the left forearm, left side  of face, speech difficulties, admitted for TIA evaluation.  Patient wife gave him 4 baby aspirin and brought him to the emergency room.  Hospital Course  Transient numbness of left forearm, left face, speech difficulties resolved, patient admitted for evaluation of TIA.  Patient MRI of the brain did not show acute stroke.  MRA of the brain also negative for any acute obstruction.  Seen by neurology.  Dr. Irish Elders recommended patient to continue aspirin every day.  Discharged home with aspirin 81 mg daily.  LDL short 94, other blood work essentially is normal including a gram showing EF 55 to 60%, lateral ultrasound also showing no hemodynamically significant stenosis.  Patient felt better the following day did not have any weakness or numbness of hands or face.  Speech was clear and discharged home in stable condition advised the wife to give him aspirin 81 mg every day. #2. BPH: Patient can continue Flomax.  Discharge Condition:    Follow UP  Follow-up Information    Rusty Aus, MD. Call.   Specialty:  Internal Medicine Why:  pts wife can call pcp and make appointment Contact information: [place] [address] [phone]             Discharge Instructions  and  Discharge Medications      Allergies as of 06/10/2018      Reactions   Augmentin [amoxicillin-pot Clavulanate] Diarrhea   Led to C-diff       Medication List    TAKE these medications   aspirin EC 81 MG tablet Take 81 mg by mouth daily.   latanoprost 0.005 % ophthalmic solution Commonly known as:  Ivin Poot  Place 1 drop into both eyes at bedtime.   PROSTATE SUPPORT PO Take by mouth daily.   tamsulosin 0.4 MG Caps capsule Commonly known as:  FLOMAX Take 0.4 mg by mouth daily.   Vitamin B12 1000 MCG Tbcr Take by mouth daily.   Vitamin D3 25 MCG (1000 UT) Caps Take 1,000 Units by mouth daily.         Diet and Activity recommendation:  See Discharge Instructions above   Consults obtained -neurology   Major procedures and Radiology Reports - PLEASE review detailed and final reports for all details, in brief -     Ct Head Wo Contrast  Result Date: 06/09/2018 CLINICAL DATA:  Patient with numbness along the left side of the face. EXAM: CT HEAD WITHOUT CONTRAST TECHNIQUE: Contiguous axial images were obtained from the base of the skull through the vertex without intravenous contrast. COMPARISON:  Brain CT 03/29/2005 FINDINGS: Brain: Ventricles and sulci are appropriate for patient's age. Periventricular and subcortical white matter hypodensity compatible with chronic microvascular ischemic changes. No evidence for acute cortically based infarct, intracranial hemorrhage, mass lesion or mass-effect. Vascular: Unremarkable Skull: Intact. Sinuses/Orbits: Paranasal sinuses are well aerated. Mastoid air cells are unremarkable. Orbits are unremarkable. Other: None. IMPRESSION: No acute intracranial process. Atrophy and chronic microvascular ischemic changes. Electronically Signed   By: Lovey Newcomer M.D.   On: 06/09/2018 15:35   Mr Brain Wo Contrast  Result Date: 06/09/2018 CLINICAL DATA:  LEFT-sided weakness and numbness. Slurred speech. LEFT facial droop. Patient now symptom free, history of TIA. EXAM: MRI HEAD WITHOUT CONTRAST MRA HEAD WITHOUT CONTRAST TECHNIQUE: Multiplanar, multiecho pulse sequences of the brain and surrounding structures were obtained without intravenous contrast. Angiographic images of the head were obtained using MRA technique without contrast. COMPARISON:  CT head earlier today.  MR head 10/01/2015. FINDINGS: MRI HEAD FINDINGS Brain: No evidence for acute infarction, hemorrhage, mass lesion, hydrocephalus, or extra-axial fluid. Generalized cerebral and cerebellar atrophy. T2 and FLAIR hyperintensities in the white matter, both focal and confluent, most consistent with chronic microvascular ischemic change. Marked  prominence perivascular spaces most commonly associated with hypertension. Vascular: Reported separately. Skull and upper cervical spine: Unremarkable marrow signal. Cervical spondylosis. Sinuses/Orbits: No layering sinus fluid. BILATERAL cataract extraction but no orbital findings. Other: None. The previous small meningioma identified on the prior exam over the RIGHT frontal convexity is difficult to visualize on today's noncontrast study no significant interval growth has occurred. MRA HEAD FINDINGS The internal carotid arteries are widely patent. Basilar artery is widely patent with both vertebrals contributing, LEFT dominant. No intracranial stenosis, occlusion, or saccular aneurysm. IMPRESSION: MRI brain demonstrates chronic changes of atrophy and small vessel disease, but no acute intracranial findings. Specifically no acute stroke. MRA intracranial demonstrates no flow reducing stenosis, dissection, or vascular occlusion. Electronically Signed   By: Staci Righter M.D.   On: 06/09/2018 19:07   US Carotid Bilateral (at [facility] Only)  Result Date: 06/10/2018 CLINICAL DATA:  84 year old male with stroke-like symptoms including left-sided numbness, weakness, slurred speech and facial droop. EXAM: BILATERAL CAROTID DUPLEX ULTRASOUND TECHNIQUE: Pearline Cables scale imaging, color Doppler and duplex ultrasound were performed of bilateral carotid and vertebral arteries in the neck. COMPARISON:  Prior duplex carotid ultrasound 10/01/2015 FINDINGS: Criteria: Quantification of carotid stenosis is based on velocity parameters that correlate the residual internal carotid diameter with NASCET-based stenosis levels, using the diameter of the distal internal carotid lumen as the denominator for stenosis measurement. The following velocity measurements were obtained: RIGHT ICA:  71/19 cm/sec CCA: 812/75 cm/sec SYSTOLIC ICA/CCA RATIO:  0.7 ECA:  88 cm/sec LEFT ICA: 85/25 cm/sec CCA: 17/00 cm/sec SYSTOLIC ICA/CCA RATIO:  1.0 ECA:   98 cm/sec RIGHT CAROTID ARTERY: Trace smooth heterogeneous atherosclerotic plaque in the proximal internal carotid artery. By peak systolic velocity criteria, the estimated stenosis remains less than 50%. RIGHT VERTEBRAL ARTERY:  Patent with normal antegrade flow. LEFT CAROTID ARTERY: Trace focal heterogeneous atherosclerotic plaque in the proximal internal carotid artery. By peak systolic velocity criteria, the estimated stenosis remains less than 50%. LEFT VERTEBRAL ARTERY:  Patent with normal antegrade flow. IMPRESSION: 1. Mild (1-49%) stenosis proximal right internal carotid artery secondary to trace smooth atherosclerotic plaque. 2. Mild (1-49%) stenosis proximal left internal carotid artery secondary to trace focal atherosclerotic plaque. 3. No significant interval progression of carotid artery disease when compared to prior imaging from 10/01/2015. 4. Vertebral arteries remain patent with normal antegrade flow. Electronically Signed   By: Jacqulynn Cadet M.D.   On: 06/10/2018 08:45   Mr Jodene Nam Head/brain [facility] Cm  Result Date: 06/09/2018 CLINICAL DATA:  LEFT-sided weakness and numbness. Slurred speech. LEFT facial droop. Patient now symptom free, history of TIA. EXAM: MRI HEAD WITHOUT CONTRAST MRA HEAD WITHOUT CONTRAST TECHNIQUE: Multiplanar, multiecho pulse sequences of the brain and surrounding structures were obtained without intravenous contrast. Angiographic images of the head were obtained using MRA technique without contrast. COMPARISON:  CT head earlier today.  MR head 10/01/2015. FINDINGS: MRI HEAD FINDINGS Brain: No evidence for acute infarction, hemorrhage, mass lesion, hydrocephalus, or extra-axial fluid. Generalized cerebral and cerebellar atrophy. T2 and FLAIR hyperintensities in the white matter, both focal and confluent, most consistent with chronic microvascular ischemic change. Marked prominence perivascular spaces most commonly associated with hypertension. Vascular: Reported separately.  Skull and upper cervical spine: Unremarkable marrow signal. Cervical spondylosis. Sinuses/Orbits: No layering sinus fluid. BILATERAL cataract extraction but no orbital findings. Other: None. The previous small meningioma identified on the prior exam over the RIGHT frontal convexity is difficult to visualize on today's noncontrast study no significant interval growth has occurred. MRA HEAD FINDINGS The internal carotid arteries are widely patent. Basilar artery is widely patent with both vertebrals contributing, LEFT dominant. No intracranial stenosis, occlusion, or saccular aneurysm. IMPRESSION: MRI brain demonstrates chronic changes of atrophy and small vessel disease, but no acute intracranial findings. Specifically no acute stroke. MRA intracranial demonstrates no flow reducing stenosis, dissection, or vascular occlusion. Electronically Signed   By: Staci Righter M.D.   On: 06/09/2018 19:07    Micro Results     No results found for this or any previous visit (from the past 240 hour(s)).     Today   Subjective:   David Myers today has no headache,no chest abdominal pain,no new weakness tingling or numbness, feels much better wants to go home today.  Objective:   Blood pressure 118/67, pulse 90, temperature 97.7 F (36.5 C), temperature source Oral, resp. rate 20, height 6' (1.829 m), weight 81.6 kg, SpO2 92 %.  No intake or output data in the 24 hours ending 06/14/18 1624  Exam Awake Alert, Oriented x 3, No new F.N deficits, Normal affect [place].AT,PERRAL Supple Neck,No JVD, No cervical lymphadenopathy appriciated.  Symmetrical Chest wall movement, Good air movement bilaterally, CTAB RRR,No Gallops,Rubs or new Murmurs, No Parasternal Heave +ve B.Sounds, Abd Soft, Non tender, No organomegaly appriciated, No rebound -guarding or rigidity. No Cyanosis, Clubbing or edema, No new Rash or bruise  Data Review   CBC w Diff:  Lab Results  Component Value Date   WBC 7.3 06/09/2018    HGB 13.2 06/09/2018   HCT 38.8 (L) 06/09/2018   PLT 200 06/09/2018    CMP:  Lab Results  Component Value Date   NA 140 06/09/2018   K 4.1 06/09/2018   CL 103 06/09/2018   CO2 29 06/09/2018   BUN 14 06/09/2018   CREATININE 1.20 06/09/2018   CREATININE 1.25 01/07/2013   PROT 7.3 06/09/2018   ALBUMIN 3.6 06/09/2018   BILITOT 0.5 06/09/2018   ALKPHOS 49 06/09/2018   AST 23 06/09/2018   ALT 20 06/09/2018  .   Total Time in preparing paper work, data evaluation and todays exam - 35 minutes  Epifanio Lesches M.D on 06/10/2018 at 4:24 PM    Note: This dictation was prepared with Dragon dictation along with smaller phrase technology. Any transcriptional errors that result from this process are unintentional.

## 2018-06-15 DIAGNOSIS — Z8673 Personal history of transient ischemic attack (TIA), and cerebral infarction without residual deficits: Secondary | ICD-10-CM | POA: Insufficient documentation

## 2018-06-15 DIAGNOSIS — G459 Transient cerebral ischemic attack, unspecified: Secondary | ICD-10-CM | POA: Diagnosis not present

## 2018-06-15 DIAGNOSIS — E1151 Type 2 diabetes mellitus with diabetic peripheral angiopathy without gangrene: Secondary | ICD-10-CM | POA: Diagnosis not present

## 2018-07-06 DIAGNOSIS — M7520 Bicipital tendinitis, unspecified shoulder: Secondary | ICD-10-CM | POA: Diagnosis not present

## 2018-07-06 DIAGNOSIS — L570 Actinic keratosis: Secondary | ICD-10-CM | POA: Diagnosis not present

## 2018-07-09 ENCOUNTER — Other Ambulatory Visit: Payer: Self-pay

## 2018-07-09 NOTE — Patient Outreach (Signed)
[facility]) Care Management  [place]  07/09/2018  David Myers 08-14-1931 [id number]  Reason for referral: 30 day discharge medication review  Unsuccessful telephone call attempt # 1 to patient.   Left HIPAA compliant message with his wife, requesting a return call.   Plan:  Outreach attempt tomorrow.   Joetta Manners, PharmD Clinical Pharmacist [place] [phone]

## 2018-07-10 ENCOUNTER — Ambulatory Visit: Payer: Self-pay

## 2018-07-10 ENCOUNTER — Other Ambulatory Visit: Payer: Self-pay

## 2018-07-10 NOTE — Patient Outreach (Signed)
[facility]) Care Management  [name]  07/10/2018  David Myers August 13, 1931 [id number]  Reason for referral: 30 day discharge medication review  Unsuccessful telephone call attempt # 2 to patient.   Left HIPAA compliant message requesting a return call.   Plan:  Outreach attempt tomorrow.   Joetta Manners, PharmD Clinical Pharmacist [place] [phone]

## 2018-07-11 ENCOUNTER — Ambulatory Visit: Payer: Self-pay

## 2018-07-11 ENCOUNTER — Other Ambulatory Visit: Payer: Self-pay

## 2018-07-11 NOTE — Patient Outreach (Signed)
[facility]) Care Management  [place]  07/11/2018  David Myers Jan 20, 1932 [id number]  Reason for referral:30 day discharge medication review  Unsuccessful telephone call attempt #3 to patient. Left HIPAA compliant message requesting a return call.   Joetta Manners, PharmD Clinical Pharmacist [place] [phone]

## 2018-08-09 DIAGNOSIS — H401212 Low-tension glaucoma, right eye, moderate stage: Secondary | ICD-10-CM | POA: Diagnosis not present

## 2018-08-09 DIAGNOSIS — H401221 Low-tension glaucoma, left eye, mild stage: Secondary | ICD-10-CM | POA: Diagnosis not present

## 2018-08-09 DIAGNOSIS — Z961 Presence of intraocular lens: Secondary | ICD-10-CM | POA: Diagnosis not present

## 2018-08-22 DIAGNOSIS — H401212 Low-tension glaucoma, right eye, moderate stage: Secondary | ICD-10-CM | POA: Diagnosis not present

## 2018-08-22 DIAGNOSIS — H401221 Low-tension glaucoma, left eye, mild stage: Secondary | ICD-10-CM | POA: Diagnosis not present

## 2018-08-22 DIAGNOSIS — Z961 Presence of intraocular lens: Secondary | ICD-10-CM | POA: Diagnosis not present

## 2018-08-30 DIAGNOSIS — H903 Sensorineural hearing loss, bilateral: Secondary | ICD-10-CM | POA: Diagnosis not present

## 2018-09-04 DIAGNOSIS — H903 Sensorineural hearing loss, bilateral: Secondary | ICD-10-CM | POA: Diagnosis not present

## 2018-09-21 DIAGNOSIS — D0461 Carcinoma in situ of skin of right upper limb, including shoulder: Secondary | ICD-10-CM | POA: Diagnosis not present

## 2018-09-21 DIAGNOSIS — D485 Neoplasm of uncertain behavior of skin: Secondary | ICD-10-CM | POA: Diagnosis not present

## 2018-09-21 DIAGNOSIS — Z85828 Personal history of other malignant neoplasm of skin: Secondary | ICD-10-CM | POA: Diagnosis not present

## 2018-09-21 DIAGNOSIS — L218 Other seborrheic dermatitis: Secondary | ICD-10-CM | POA: Diagnosis not present

## 2018-09-21 DIAGNOSIS — L821 Other seborrheic keratosis: Secondary | ICD-10-CM | POA: Diagnosis not present

## 2018-09-21 DIAGNOSIS — Z08 Encounter for follow-up examination after completed treatment for malignant neoplasm: Secondary | ICD-10-CM | POA: Diagnosis not present

## 2018-10-10 DIAGNOSIS — D0461 Carcinoma in situ of skin of right upper limb, including shoulder: Secondary | ICD-10-CM | POA: Diagnosis not present

## 2018-10-17 DIAGNOSIS — E538 Deficiency of other specified B group vitamins: Secondary | ICD-10-CM | POA: Diagnosis not present

## 2018-10-17 DIAGNOSIS — E782 Mixed hyperlipidemia: Secondary | ICD-10-CM | POA: Diagnosis not present

## 2018-10-17 DIAGNOSIS — R972 Elevated prostate specific antigen [PSA]: Secondary | ICD-10-CM | POA: Diagnosis not present

## 2018-10-17 DIAGNOSIS — Z125 Encounter for screening for malignant neoplasm of prostate: Secondary | ICD-10-CM | POA: Diagnosis not present

## 2018-10-17 DIAGNOSIS — E1151 Type 2 diabetes mellitus with diabetic peripheral angiopathy without gangrene: Secondary | ICD-10-CM | POA: Diagnosis not present

## 2018-10-24 DIAGNOSIS — E538 Deficiency of other specified B group vitamins: Secondary | ICD-10-CM | POA: Diagnosis not present

## 2018-10-24 DIAGNOSIS — E1165 Type 2 diabetes mellitus with hyperglycemia: Secondary | ICD-10-CM | POA: Diagnosis not present

## 2018-10-24 DIAGNOSIS — E1151 Type 2 diabetes mellitus with diabetic peripheral angiopathy without gangrene: Secondary | ICD-10-CM | POA: Diagnosis not present

## 2018-10-24 DIAGNOSIS — Z0000 Encounter for general adult medical examination without abnormal findings: Secondary | ICD-10-CM | POA: Diagnosis not present

## 2018-12-18 DIAGNOSIS — E1165 Type 2 diabetes mellitus with hyperglycemia: Secondary | ICD-10-CM | POA: Diagnosis not present

## 2018-12-25 DIAGNOSIS — E782 Mixed hyperlipidemia: Secondary | ICD-10-CM | POA: Diagnosis not present

## 2018-12-25 DIAGNOSIS — E1151 Type 2 diabetes mellitus with diabetic peripheral angiopathy without gangrene: Secondary | ICD-10-CM | POA: Diagnosis not present

## 2018-12-25 DIAGNOSIS — E538 Deficiency of other specified B group vitamins: Secondary | ICD-10-CM | POA: Diagnosis not present

## 2019-01-22 DIAGNOSIS — H401221 Low-tension glaucoma, left eye, mild stage: Secondary | ICD-10-CM | POA: Diagnosis not present

## 2019-01-22 DIAGNOSIS — H401212 Low-tension glaucoma, right eye, moderate stage: Secondary | ICD-10-CM | POA: Diagnosis not present

## 2019-01-22 DIAGNOSIS — Z961 Presence of intraocular lens: Secondary | ICD-10-CM | POA: Diagnosis not present

## 2019-03-14 DIAGNOSIS — D225 Melanocytic nevi of trunk: Secondary | ICD-10-CM | POA: Diagnosis not present

## 2019-03-14 DIAGNOSIS — D2272 Melanocytic nevi of left lower limb, including hip: Secondary | ICD-10-CM | POA: Diagnosis not present

## 2019-03-14 DIAGNOSIS — L821 Other seborrheic keratosis: Secondary | ICD-10-CM | POA: Diagnosis not present

## 2019-03-14 DIAGNOSIS — D2262 Melanocytic nevi of left upper limb, including shoulder: Secondary | ICD-10-CM | POA: Diagnosis not present

## 2019-03-14 DIAGNOSIS — Z08 Encounter for follow-up examination after completed treatment for malignant neoplasm: Secondary | ICD-10-CM | POA: Diagnosis not present

## 2019-03-14 DIAGNOSIS — D2271 Melanocytic nevi of right lower limb, including hip: Secondary | ICD-10-CM | POA: Diagnosis not present

## 2019-03-14 DIAGNOSIS — D2261 Melanocytic nevi of right upper limb, including shoulder: Secondary | ICD-10-CM | POA: Diagnosis not present

## 2019-03-14 DIAGNOSIS — Z85828 Personal history of other malignant neoplasm of skin: Secondary | ICD-10-CM | POA: Diagnosis not present

## 2019-03-15 DIAGNOSIS — K219 Gastro-esophageal reflux disease without esophagitis: Secondary | ICD-10-CM | POA: Diagnosis not present

## 2019-03-15 DIAGNOSIS — R490 Dysphonia: Secondary | ICD-10-CM | POA: Diagnosis not present

## 2019-03-27 DIAGNOSIS — K219 Gastro-esophageal reflux disease without esophagitis: Secondary | ICD-10-CM | POA: Diagnosis not present

## 2019-04-22 DIAGNOSIS — E1151 Type 2 diabetes mellitus with diabetic peripheral angiopathy without gangrene: Secondary | ICD-10-CM | POA: Diagnosis not present

## 2019-04-22 DIAGNOSIS — E538 Deficiency of other specified B group vitamins: Secondary | ICD-10-CM | POA: Diagnosis not present

## 2019-04-24 DIAGNOSIS — R7309 Other abnormal glucose: Secondary | ICD-10-CM | POA: Diagnosis not present

## 2019-04-24 DIAGNOSIS — H401221 Low-tension glaucoma, left eye, mild stage: Secondary | ICD-10-CM | POA: Diagnosis not present

## 2019-04-24 DIAGNOSIS — Z961 Presence of intraocular lens: Secondary | ICD-10-CM | POA: Diagnosis not present

## 2019-04-24 DIAGNOSIS — H401212 Low-tension glaucoma, right eye, moderate stage: Secondary | ICD-10-CM | POA: Diagnosis not present

## 2019-04-26 DIAGNOSIS — E538 Deficiency of other specified B group vitamins: Secondary | ICD-10-CM | POA: Diagnosis not present

## 2019-04-26 DIAGNOSIS — Z125 Encounter for screening for malignant neoplasm of prostate: Secondary | ICD-10-CM | POA: Diagnosis not present

## 2019-04-26 DIAGNOSIS — Z23 Encounter for immunization: Secondary | ICD-10-CM | POA: Diagnosis not present

## 2019-04-26 DIAGNOSIS — E782 Mixed hyperlipidemia: Secondary | ICD-10-CM | POA: Diagnosis not present

## 2019-04-26 DIAGNOSIS — E1151 Type 2 diabetes mellitus with diabetic peripheral angiopathy without gangrene: Secondary | ICD-10-CM | POA: Diagnosis not present

## 2019-05-09 DIAGNOSIS — R490 Dysphonia: Secondary | ICD-10-CM | POA: Diagnosis not present

## 2019-05-09 DIAGNOSIS — K219 Gastro-esophageal reflux disease without esophagitis: Secondary | ICD-10-CM | POA: Diagnosis not present

## 2019-07-10 DIAGNOSIS — R3912 Poor urinary stream: Secondary | ICD-10-CM | POA: Diagnosis not present

## 2019-07-10 DIAGNOSIS — R300 Dysuria: Secondary | ICD-10-CM | POA: Diagnosis not present

## 2019-07-29 DIAGNOSIS — E1151 Type 2 diabetes mellitus with diabetic peripheral angiopathy without gangrene: Secondary | ICD-10-CM | POA: Diagnosis not present

## 2019-07-29 DIAGNOSIS — M5116 Intervertebral disc disorders with radiculopathy, lumbar region: Secondary | ICD-10-CM | POA: Diagnosis not present

## 2019-07-29 DIAGNOSIS — N401 Enlarged prostate with lower urinary tract symptoms: Secondary | ICD-10-CM | POA: Diagnosis not present

## 2019-08-09 DIAGNOSIS — H401212 Low-tension glaucoma, right eye, moderate stage: Secondary | ICD-10-CM | POA: Diagnosis not present

## 2019-08-09 DIAGNOSIS — H401221 Low-tension glaucoma, left eye, mild stage: Secondary | ICD-10-CM | POA: Diagnosis not present

## 2019-08-09 DIAGNOSIS — R7309 Other abnormal glucose: Secondary | ICD-10-CM | POA: Diagnosis not present

## 2019-08-09 DIAGNOSIS — Z961 Presence of intraocular lens: Secondary | ICD-10-CM | POA: Diagnosis not present

## 2019-08-22 ENCOUNTER — Ambulatory Visit: Payer: PPO | Admitting: Urology

## 2019-08-22 ENCOUNTER — Encounter: Payer: Self-pay | Admitting: Urology

## 2019-08-22 ENCOUNTER — Other Ambulatory Visit: Payer: Self-pay

## 2019-08-22 VITALS — BP 136/79 | HR 70 | Ht 69.0 in | Wt 162.0 lb

## 2019-08-22 DIAGNOSIS — R3914 Feeling of incomplete bladder emptying: Secondary | ICD-10-CM | POA: Diagnosis not present

## 2019-08-22 DIAGNOSIS — N401 Enlarged prostate with lower urinary tract symptoms: Secondary | ICD-10-CM | POA: Diagnosis not present

## 2019-08-22 DIAGNOSIS — R350 Frequency of micturition: Secondary | ICD-10-CM | POA: Diagnosis not present

## 2019-08-22 LAB — URINALYSIS, COMPLETE
Bilirubin, UA: NEGATIVE
Glucose, UA: NEGATIVE
Ketones, UA: NEGATIVE
Nitrite, UA: NEGATIVE
Protein,UA: NEGATIVE
RBC, UA: NEGATIVE
Specific Gravity, UA: 1.02 (ref 1.005–1.030)
Urobilinogen, Ur: 0.2 mg/dL (ref 0.2–1.0)
pH, UA: 5.5 (ref 5.0–7.5)

## 2019-08-22 LAB — MICROSCOPIC EXAMINATION
Bacteria, UA: NONE SEEN
Epithelial Cells (non renal): NONE SEEN /hpf (ref 0–10)
RBC: NONE SEEN /hpf (ref 0–2)

## 2019-08-22 LAB — BLADDER SCAN AMB NON-IMAGING: Scan Result: 191

## 2019-08-22 MED ORDER — SILODOSIN 8 MG PO CAPS: 8.0000 mg | ORAL_CAPSULE | Freq: Every day | ORAL | 1 refills | Status: DC

## 2019-08-22 NOTE — Progress Notes (Addendum)
08/22/2019 8:44 AM   David Myers 28-Dec-1931 [id number]  Referring provider: Rusty Aus, MD [facility] [place],  [place] [address]  Chief Complaint  Patient presents with  . Urinary Frequency    HPI: 86 y.o. male seen in consultation at the request of Dr. Sabra Heck for evaluation of lower urinary tract symptoms.  He has a long history of BPH and has been on tamsulosin for several years.  Approximately 3 months ago he began to have worsening lower urinary tract symptoms and Dr. Sabra Heck discussed starting dutasteride.  Due to worsening of his symptoms he states he started dutasteride approximately 2 weeks ago however was concerned about potential side effects and the length of time it took for this medication to be effective so he requested a urologic evaluation.  He complains of urinary hesitancy, slow urinary stream with dribbling, frequency and urinary incontinence.  IPSS completed today was 23/35 with a QoL rated 5/6.  Denies dysuria, gross hematuria or UTI.  He had a negative UA and culture and early December 2020.  Creatinine September 2020 was stable at 1.3.  He has a history of stone disease in the remote past.   PMH: Past Medical History:  Diagnosis Date  . Diabetes mellitus without complication (HCC)    diet controlled  . History of hiatal hernia     Surgical History: Past Surgical History:  Procedure Laterality Date  . CATARACT EXTRACTION W/PHACO Left 08/12/2015   Procedure: CATARACT EXTRACTION PHACO AND INTRAOCULAR LENS PLACEMENT (IOC);  Surgeon: Leandrew Koyanagi, MD;  Location: [place];  Service: Ophthalmology;  Laterality: Left;  DIABETIC - diet controlled TORIC  . CATARACT EXTRACTION W/PHACO Right 09/09/2015   Procedure: CATARACT EXTRACTION PHACO AND INTRAOCULAR LENS PLACEMENT (IOC);  Surgeon: Leandrew Koyanagi, MD;  Location: [place];  Service: Ophthalmology;  Laterality: Right;  DIABETIC  - diet controlled  . COLONOSCOPY    . WRIST SURGERY      Home Medications:  Allergies as of 08/22/2019      Reactions   Augmentin [amoxicillin-pot Clavulanate] Diarrhea   Led to C-diff       Medication List       Accurate as of August 22, 2019  8:44 AM. If you have any questions, ask your nurse or doctor.        aspirin EC 81 MG tablet Take 81 mg by mouth daily.   latanoprost 0.005 % ophthalmic solution Commonly known as: XALATAN Place 1 drop into both eyes at bedtime.   losartan-hydrochlorothiazide 50-12.5 MG tablet Commonly known as: HYZAAR Take by mouth.   PROSTATE SUPPORT PO Take by mouth daily.   simvastatin 20 MG tablet Commonly known as: ZOCOR TAKE 1 TABLET BY MOUTH EVERY DAY AT NIGHT   tadalafil 10 MG tablet Commonly known as: CIALIS Take by mouth.   tamsulosin 0.4 MG Caps capsule Commonly known as: FLOMAX Take 0.4 mg by mouth daily.   Vitamin B12 1000 MCG Tbcr Take by mouth daily.   Vitamin D3 25 MCG (1000 UT) Caps Take 1,000 Units by mouth daily.       Allergies:  Allergies  Allergen Reactions  . Augmentin [Amoxicillin-Pot Clavulanate] Diarrhea    Led to C-diff     Family History: Family History  Problem Relation Age of Onset  . Hypertension Mother     Social History:  reports that he has never smoked. He has never used smokeless tobacco. He reports that he does not drink  alcohol. No history on file for drug.  ROS: UROLOGY Frequent Urination?: Yes Hard to postpone urination?: Yes Burning/pain with urination?: No Get up at night to urinate?: Yes Leakage of urine?: Yes Urine stream starts and stops?: No Trouble starting stream?: No Do you have to strain to urinate?: No Blood in urine?: No Urinary tract infection?: No Sexually transmitted disease?: No Injury to kidneys or bladder?: No Painful intercourse?: No Weak stream?: Yes Erection problems?: Yes Penile pain?: No  Gastrointestinal Nausea?: No Vomiting?:  No Indigestion/heartburn?: No Diarrhea?: No Constipation?: No  Constitutional Fever: No Night sweats?: No Weight loss?: No Fatigue?: No  Skin Skin rash/lesions?: No Itching?: No  Eyes Blurred vision?: No Double vision?: No  Ears/Nose/Throat Sore throat?: No Sinus problems?: No  Hematologic/Lymphatic Swollen glands?: No Easy bruising?: No  Cardiovascular Leg swelling?: No Chest pain?: No  Respiratory Cough?: No Shortness of breath?: No  Endocrine Excessive thirst?: No  Musculoskeletal Back pain?: No Joint pain?: No  Neurological Headaches?: No Dizziness?: No  Psychologic Depression?: No Anxiety?: No  Physical Exam: BP 136/79   Pulse 70   Ht 5\' 9"  (1.753 m)   Wt 162 lb (73.5 kg)   BMI 23.92 kg/m   Constitutional:  Alert and oriented, No acute distress. HEENT: [place] AT, moist mucus membranes.  Trachea midline, no masses. Cardiovascular: No clubbing, cyanosis, or edema. Respiratory: Normal respiratory effort, no increased work of breathing. GI: Abdomen is soft, nontender, nondistended, no abdominal masses GU: No CVA tenderness.  Prostate 50 g, smooth without nodules Lymph: No cervical or inguinal lymphadenopathy. Skin: No rashes, bruises or suspicious lesions. Neurologic: Grossly intact, no focal deficits, moving all 4 extremities. Psychiatric: Normal mood and affect.   Assessment & Plan:    - BPH with lower urinary tract symptoms He has severe lower urinary tract symptoms.  PVR by bladder scan today was 191 mL.  I recommended scheduling cystoscopy and TRUS for volume as he is interested in minimally invasive outlet procedures.  He does understand it will take 4-6 months to see any improvement with dutasteride and based on symptom severity and bother he does not want to wait this long.  Will give a trial of silodosin until his cystoscopy can be scheduled.  Rx sent to pharmacy.  Abbie Sons, [place] [address] [place], [place] [address] [phone]

## 2019-08-28 ENCOUNTER — Telehealth: Payer: Self-pay | Admitting: Urology

## 2019-08-28 NOTE — Telephone Encounter (Signed)
Pt's wife called asking about U/A and culture results.  Please call either pt or wife back.

## 2019-08-28 NOTE — Telephone Encounter (Signed)
Labs looked good. Patient pleased.

## 2019-09-09 ENCOUNTER — Encounter: Payer: Self-pay | Admitting: Urology

## 2019-09-09 ENCOUNTER — Ambulatory Visit: Payer: PPO | Admitting: Urology

## 2019-09-09 ENCOUNTER — Other Ambulatory Visit: Payer: Self-pay

## 2019-09-09 ENCOUNTER — Telehealth: Payer: Self-pay | Admitting: Urology

## 2019-09-09 VITALS — BP 128/65 | HR 72 | Ht 70.0 in | Wt 162.0 lb

## 2019-09-09 DIAGNOSIS — N401 Enlarged prostate with lower urinary tract symptoms: Secondary | ICD-10-CM

## 2019-09-09 DIAGNOSIS — R3914 Feeling of incomplete bladder emptying: Secondary | ICD-10-CM

## 2019-09-09 NOTE — Progress Notes (Signed)
   09/09/19  CC:  Chief Complaint  Patient presents with  . Cysto    trus     Indications: Lower urinary tract symptoms with incomplete bladder emptying.  Also interested in an outlet procedure  HPI: Mr. Geiman has no complaints today.  No significant improvement in his symptoms on silodosin.  He is also taking finasteride.  Height 5\' 10"  (1.778 m), weight 162 lb (73.5 kg). NED. A&Ox3.    Cystoscopy Procedure Note  Patient identification was confirmed, informed consent was obtained, and patient was prepped using Betadine solution.  Lidocaine jelly was administered per urethral meatus.     Pre-Procedure: - Inspection reveals a normal caliber urethral meatus.  Procedure: The flexible cystoscope was introduced without difficulty - No urethral strictures/lesions are present. - Moderate lateral lobe enlargement prostate  - Mild elevation bladder neck - Bilateral ureteral orifices identified - Bladder mucosa  reveals no ulcers, tumors, or lesions - No bladder stones -Trabeculated bladder with cellule formation  Retroflexion shows no median lobe   Post-Procedure: - Patient tolerated the procedure well  Assessment/ Plan: -Moderate lateral lobe enlargement -Management options were discussed including continuing medication and outlet procedures.  He would be a candidate for UroLift. -We also discussed that in our procedure may not improve symptoms secondary to bladder hypotonicity. -He would like to think over his options and will follow up in 1 month. -Recheck PVR for bladder scan on follow-up   Abbie Sons, MD

## 2019-09-09 NOTE — Progress Notes (Signed)
Transrectal ultrasound prostate  An 8.5 MHz transrectal ultrasound probe was lubricated and gently inserted per rectum.  The prostate was imaged in transverse and sagittal planes.  Moderate transition zone enlargement.  No echogenic abnormalities of the peripheral zone were noted.  Scattered prostatic calculi.  Prostate volume calculated 46 cc.  Impression: Moderate prostate enlargement

## 2019-09-09 NOTE — Telephone Encounter (Signed)
Pt needs clarification of medication, pt confused as to what or if a medication was to be stopped when he was told to start taking another medication Rx by [facility]. Please advise pt.

## 2019-09-09 NOTE — Telephone Encounter (Signed)
Talk with patient. He is aware of what to take now. He is taking Avodart  and silodosin

## 2019-09-12 ENCOUNTER — Ambulatory Visit: Payer: Self-pay | Admitting: Urology

## 2019-09-13 ENCOUNTER — Other Ambulatory Visit: Payer: Self-pay | Admitting: Urology

## 2019-10-07 ENCOUNTER — Other Ambulatory Visit: Payer: Self-pay

## 2019-10-07 ENCOUNTER — Ambulatory Visit: Payer: PPO | Admitting: Urology

## 2019-10-07 ENCOUNTER — Encounter: Payer: Self-pay | Admitting: Urology

## 2019-10-07 VITALS — BP 136/77 | HR 62 | Ht 69.0 in | Wt 162.0 lb

## 2019-10-07 DIAGNOSIS — R3914 Feeling of incomplete bladder emptying: Secondary | ICD-10-CM | POA: Diagnosis not present

## 2019-10-07 DIAGNOSIS — N401 Enlarged prostate with lower urinary tract symptoms: Secondary | ICD-10-CM

## 2019-10-07 LAB — BLADDER SCAN AMB NON-IMAGING: Scan Result: 172

## 2019-10-07 NOTE — Progress Notes (Signed)
10/07/2019 10:24 AM   David Myers [birth year] [id number]  Referring provider: Rusty Aus, MD [facility] [place],  [place] [address]  Chief Complaint  Patient presents with  . Benign Prostatic Hypertrophy    HPI: 86 yo male presents for follow-up of BPH.  -Lower urinary tract symptoms with incomplete bladder emptying (PVR <200 mL) -Was interested in UroLift; cystoscopy/TRUS 2/8 -TRUS 46 g gland; no obstructive median lobe on cystoscopy -Was undecided on an outlet procedure and presents for 1 month follow-up.   PMH: Past Medical History:  Diagnosis Date  . Diabetes mellitus without complication (HCC)    diet controlled  . History of hiatal hernia     Surgical History: Past Surgical History:  Procedure Laterality Date  . CATARACT EXTRACTION W/PHACO Left 08/12/2015   Procedure: CATARACT EXTRACTION PHACO AND INTRAOCULAR LENS PLACEMENT (IOC);  Surgeon: Leandrew Koyanagi, MD;  Location: [facility];  Service: Ophthalmology;  Laterality: Left;  DIABETIC - diet controlled TORIC  . CATARACT EXTRACTION W/PHACO Right 09/09/2015   Procedure: CATARACT EXTRACTION PHACO AND INTRAOCULAR LENS PLACEMENT (IOC);  Surgeon: Leandrew Koyanagi, MD;  Location: [facility];  Service: Ophthalmology;  Laterality: Right;  DIABETIC - diet controlled  . COLONOSCOPY    . WRIST SURGERY      Home Medications:  Allergies as of 10/07/2019      Reactions   Augmentin [amoxicillin-pot Clavulanate] Diarrhea   Led to C-diff       Medication List       Accurate as of October 07, 2019 10:24 AM. If you have any questions, ask your nurse or doctor.        aspirin EC 81 MG tablet Take 81 mg by mouth daily.   dorzolamide 2 % ophthalmic solution Commonly known as: TRUSOPT 1 drop 2 (two) times daily.   dutasteride 0.5 MG capsule Commonly known as: AVODART Take 0.5 mg by mouth daily.   latanoprost 0.005 % ophthalmic  solution Commonly known as: XALATAN Place 1 drop into both eyes at bedtime.   losartan-hydrochlorothiazide 50-12.5 MG tablet Commonly known as: HYZAAR Take by mouth.   pantoprazole 40 MG tablet Commonly known as: PROTONIX Take 40 mg by mouth daily.   PROSTATE SUPPORT PO Take by mouth daily.   silodosin 8 MG Caps capsule Commonly known as: RAPAFLO TAKE 1 CAPSULE (8 MG TOTAL) BY MOUTH DAILY WITH BREAKFAST.   simvastatin 20 MG tablet Commonly known as: ZOCOR TAKE 1 TABLET BY MOUTH EVERY DAY AT NIGHT   tadalafil 10 MG tablet Commonly known as: CIALIS Take by mouth.   tamsulosin 0.4 MG Caps capsule Commonly known as: FLOMAX Take 0.4 mg by mouth daily.   Vitamin B12 1000 MCG Tbcr Take by mouth daily.   Vitamin D3 25 MCG (1000 UT) Caps Take 1,000 Units by mouth daily.       Allergies:  Allergies  Allergen Reactions  . Augmentin [Amoxicillin-Pot Clavulanate] Diarrhea    Led to C-diff     Family History: Family History  Problem Relation Age of Onset  . Hypertension Mother     Social History:  reports that he has never smoked. He has never used smokeless tobacco. He reports that he does not drink alcohol. No history on file for drug.   Physical Exam: BP 136/77 (BP Location: Left Arm, Patient Position: Sitting, Cuff Size: Large)   Pulse 62   Ht 5\' 9"  (1.753 m)   Wt 162 lb (73.5 kg)  BMI 23.92 kg/m   Constitutional:  Alert and oriented, No acute distress.    Assessment & Plan:    - Benign prostatic hyperplasia with incomplete bladder emptying Reviewed management options including continued medical management and following residuals; minimally invasive procedures including UroLift and TURP/PVP.  He does not desire the latter.  He wants to think this over and states he will call back in approximately 2 weeks if he desires to proceed with UroLift.  The procedure was again discussed.  We will tentatively schedule a 87-month follow-up with bladder scan for  PVR.  PVR today was stable at 172 mL.   Abbie Sons, [place] [address], [place], [place] [address] [phone]

## 2019-10-24 DIAGNOSIS — Z125 Encounter for screening for malignant neoplasm of prostate: Secondary | ICD-10-CM | POA: Diagnosis not present

## 2019-10-24 DIAGNOSIS — E1151 Type 2 diabetes mellitus with diabetic peripheral angiopathy without gangrene: Secondary | ICD-10-CM | POA: Diagnosis not present

## 2019-10-24 DIAGNOSIS — E538 Deficiency of other specified B group vitamins: Secondary | ICD-10-CM | POA: Diagnosis not present

## 2019-10-29 DIAGNOSIS — E782 Mixed hyperlipidemia: Secondary | ICD-10-CM | POA: Diagnosis not present

## 2019-10-29 DIAGNOSIS — R0789 Other chest pain: Secondary | ICD-10-CM | POA: Diagnosis not present

## 2019-10-29 DIAGNOSIS — E538 Deficiency of other specified B group vitamins: Secondary | ICD-10-CM | POA: Diagnosis not present

## 2019-10-29 DIAGNOSIS — E1151 Type 2 diabetes mellitus with diabetic peripheral angiopathy without gangrene: Secondary | ICD-10-CM | POA: Diagnosis not present

## 2019-10-29 DIAGNOSIS — Z0000 Encounter for general adult medical examination without abnormal findings: Secondary | ICD-10-CM | POA: Diagnosis not present

## 2019-11-06 DIAGNOSIS — N390 Urinary tract infection, site not specified: Secondary | ICD-10-CM | POA: Diagnosis not present

## 2019-11-08 DIAGNOSIS — S0502XA Injury of conjunctiva and corneal abrasion without foreign body, left eye, initial encounter: Secondary | ICD-10-CM | POA: Diagnosis not present

## 2019-11-08 DIAGNOSIS — H401212 Low-tension glaucoma, right eye, moderate stage: Secondary | ICD-10-CM | POA: Diagnosis not present

## 2019-11-08 DIAGNOSIS — Z961 Presence of intraocular lens: Secondary | ICD-10-CM | POA: Diagnosis not present

## 2019-11-08 DIAGNOSIS — H401221 Low-tension glaucoma, left eye, mild stage: Secondary | ICD-10-CM | POA: Diagnosis not present

## 2019-11-08 DIAGNOSIS — R7309 Other abnormal glucose: Secondary | ICD-10-CM | POA: Diagnosis not present

## 2019-11-11 DIAGNOSIS — H401221 Low-tension glaucoma, left eye, mild stage: Secondary | ICD-10-CM | POA: Diagnosis not present

## 2019-11-11 DIAGNOSIS — R7309 Other abnormal glucose: Secondary | ICD-10-CM | POA: Diagnosis not present

## 2019-11-11 DIAGNOSIS — Z961 Presence of intraocular lens: Secondary | ICD-10-CM | POA: Diagnosis not present

## 2019-11-11 DIAGNOSIS — H401212 Low-tension glaucoma, right eye, moderate stage: Secondary | ICD-10-CM | POA: Diagnosis not present

## 2019-11-11 DIAGNOSIS — S0500XA Injury of conjunctiva and corneal abrasion without foreign body, unspecified eye, initial encounter: Secondary | ICD-10-CM | POA: Diagnosis not present

## 2020-01-14 DIAGNOSIS — R300 Dysuria: Secondary | ICD-10-CM | POA: Diagnosis not present

## 2020-01-30 DIAGNOSIS — M545 Low back pain: Secondary | ICD-10-CM | POA: Diagnosis not present

## 2020-01-30 DIAGNOSIS — N401 Enlarged prostate with lower urinary tract symptoms: Secondary | ICD-10-CM | POA: Diagnosis not present

## 2020-01-31 ENCOUNTER — Other Ambulatory Visit: Payer: Self-pay | Admitting: Physician Assistant

## 2020-01-31 DIAGNOSIS — S32010A Wedge compression fracture of first lumbar vertebra, initial encounter for closed fracture: Secondary | ICD-10-CM

## 2020-02-09 ENCOUNTER — Other Ambulatory Visit: Payer: Self-pay

## 2020-02-09 ENCOUNTER — Ambulatory Visit
Admission: RE | Admit: 2020-02-09 | Discharge: 2020-02-09 | Disposition: A | Discharge status: Home / Self Care | Payer: PPO | Source: Ambulatory Visit | Attending: Physician Assistant | Admitting: Physician Assistant

## 2020-02-09 DIAGNOSIS — S32010A Wedge compression fracture of first lumbar vertebra, initial encounter for closed fracture: Secondary | ICD-10-CM | POA: Insufficient documentation

## 2020-02-09 DIAGNOSIS — M545 Low back pain: Secondary | ICD-10-CM | POA: Diagnosis not present

## 2020-02-10 DIAGNOSIS — M9933 Osseous stenosis of neural canal of lumbar region: Secondary | ICD-10-CM | POA: Diagnosis not present

## 2020-02-12 DIAGNOSIS — Z961 Presence of intraocular lens: Secondary | ICD-10-CM | POA: Diagnosis not present

## 2020-02-12 DIAGNOSIS — H401221 Low-tension glaucoma, left eye, mild stage: Secondary | ICD-10-CM | POA: Diagnosis not present

## 2020-02-12 DIAGNOSIS — H401212 Low-tension glaucoma, right eye, moderate stage: Secondary | ICD-10-CM | POA: Diagnosis not present

## 2020-02-12 DIAGNOSIS — R7309 Other abnormal glucose: Secondary | ICD-10-CM | POA: Diagnosis not present

## 2020-02-21 DIAGNOSIS — E1151 Type 2 diabetes mellitus with diabetic peripheral angiopathy without gangrene: Secondary | ICD-10-CM | POA: Diagnosis not present

## 2020-02-21 DIAGNOSIS — M5416 Radiculopathy, lumbar region: Secondary | ICD-10-CM | POA: Diagnosis not present

## 2020-03-05 DIAGNOSIS — M48062 Spinal stenosis, lumbar region with neurogenic claudication: Secondary | ICD-10-CM | POA: Insufficient documentation

## 2020-03-05 DIAGNOSIS — M5442 Lumbago with sciatica, left side: Secondary | ICD-10-CM | POA: Diagnosis not present

## 2020-03-05 DIAGNOSIS — M5441 Lumbago with sciatica, right side: Secondary | ICD-10-CM | POA: Diagnosis not present

## 2020-03-05 DIAGNOSIS — G8929 Other chronic pain: Secondary | ICD-10-CM | POA: Insufficient documentation

## 2020-04-08 DIAGNOSIS — L218 Other seborrheic dermatitis: Secondary | ICD-10-CM | POA: Diagnosis not present

## 2020-04-08 DIAGNOSIS — D2261 Melanocytic nevi of right upper limb, including shoulder: Secondary | ICD-10-CM | POA: Diagnosis not present

## 2020-04-08 DIAGNOSIS — D225 Melanocytic nevi of trunk: Secondary | ICD-10-CM | POA: Diagnosis not present

## 2020-04-08 DIAGNOSIS — D2262 Melanocytic nevi of left upper limb, including shoulder: Secondary | ICD-10-CM | POA: Diagnosis not present

## 2020-04-08 DIAGNOSIS — L821 Other seborrheic keratosis: Secondary | ICD-10-CM | POA: Diagnosis not present

## 2020-04-08 DIAGNOSIS — Z85828 Personal history of other malignant neoplasm of skin: Secondary | ICD-10-CM | POA: Diagnosis not present

## 2020-04-19 NOTE — Progress Notes (Signed)
04/20/2020 11:38 AM   David Myers 14-Feb-1932 [id number]  Referring provider: Rusty Aus, MD [place] [place],  [place] [address] Chief Complaint  Patient presents with  . Benign Prostatic Hypertrophy    HPI: David Myers is a 86 y.o. male who returns for a 6 month follow up of BPH with incomplete bladder emptying.  -Was interested in UroLift; cystoscopy/TRUS 09/09/19 -TRUS 46 g gland; no obstructive median lobe on cystoscopy -He reports dribbling with a slow stream. -He has urinary urgency, and urge incontinence. -He reports having hesitancy. -He stopped the dutasteride. He remains on Flomax.  -PVR 180 mL. -Started a prostate supplement 2 weeks ago "prostagenix"   PMH: Past Medical History:  Diagnosis Date  . Diabetes mellitus without complication (HCC)    diet controlled  . History of hiatal hernia     Surgical History: Past Surgical History:  Procedure Laterality Date  . CATARACT EXTRACTION W/PHACO Left 08/12/2015   Procedure: CATARACT EXTRACTION PHACO AND INTRAOCULAR LENS PLACEMENT (IOC);  Surgeon: Leandrew Koyanagi, MD;  Location: [facility];  Service: Ophthalmology;  Laterality: Left;  DIABETIC - diet controlled TORIC  . CATARACT EXTRACTION W/PHACO Right 09/09/2015   Procedure: CATARACT EXTRACTION PHACO AND INTRAOCULAR LENS PLACEMENT (IOC);  Surgeon: Leandrew Koyanagi, MD;  Location: [facility];  Service: Ophthalmology;  Laterality: Right;  DIABETIC - diet controlled  . COLONOSCOPY    . WRIST SURGERY      Home Medications:  Allergies as of 04/20/2020      Reactions   Augmentin [amoxicillin-pot Clavulanate] Diarrhea   Led to C-diff       Medication List       Accurate as of April 20, 2020 11:38 AM. If you have any questions, ask your nurse or doctor.        aspirin EC 81 MG tablet Take 81 mg by mouth daily.   dorzolamide 2 % ophthalmic solution Commonly known  as: TRUSOPT 1 drop 2 (two) times daily.   dutasteride 0.5 MG capsule Commonly known as: AVODART Take 0.5 mg by mouth daily.   latanoprost 0.005 % ophthalmic solution Commonly known as: XALATAN Place 1 drop into both eyes at bedtime.   losartan-hydrochlorothiazide 50-12.5 MG tablet Commonly known as: HYZAAR Take by mouth.   pantoprazole 40 MG tablet Commonly known as: PROTONIX Take 40 mg by mouth daily.   PROSTATE SUPPORT PO Take by mouth daily.   silodosin 8 MG Caps capsule Commonly known as: RAPAFLO TAKE 1 CAPSULE (8 MG TOTAL) BY MOUTH DAILY WITH BREAKFAST.   simvastatin 20 MG tablet Commonly known as: ZOCOR TAKE 1 TABLET BY MOUTH EVERY DAY AT NIGHT   tadalafil 10 MG tablet Commonly known as: CIALIS Take by mouth.   tamsulosin 0.4 MG Caps capsule Commonly known as: FLOMAX Take 0.4 mg by mouth daily.   Vitamin B12 1000 MCG Tbcr Take by mouth daily.   Vitamin D3 25 MCG (1000 UT) Caps Take 1,000 Units by mouth daily.       Allergies:  Allergies  Allergen Reactions  . Augmentin [Amoxicillin-Pot Clavulanate] Diarrhea    Led to C-diff     Family History: Family History  Problem Relation Age of Onset  . Hypertension Mother     Social History:  reports that he has never smoked. He has never used smokeless tobacco. He reports that he does not drink alcohol. No history on file for drug use.   Physical Exam: BP (!) 160/90  Pulse 63   Ht 5\' 8"  (1.727 m)   Wt 161 lb (73 kg)   BMI 24.48 kg/m   Constitutional:  Alert and oriented, No acute distress. HEENT: [place] AT, moist mucus membranes.  Trachea midline, no masses. Cardiovascular: No clubbing, cyanosis, or edema. Respiratory: Normal respiratory effort, no increased work of breathing. Skin: No rashes, bruises or suspicious lesions. Neurologic: Grossly intact, no focal deficits, moving all 4 extremities. Psychiatric: Normal mood and affect.   Pertinent Imaging: Results for orders placed or performed in  visit on 04/20/20  Bladder Scan (Post Void Residual) in office  Result Value Ref Range   Scan Result 180      Assessment & Plan:    1. BPH with incomplete bladder emptying  PVR today is stable at 180 mL.   We discussed alternatives including TURP, PVP and holmium laser enucleation of the prostate for large glands.  Minimally invasive options of UroLift and water vapor ablation were discussed. Differences between the surgical procedures were discussed as well as the risks and benefits of each.  He is uncertain if he wants to pursue this right now.   Will continue the prostate supplement for now.   Follow up in 3 months with PVR.  He would like to discuss UroLift further at that visit   [place] [address] [place], [place] [address] [phone]  I, Selena Batten, am acting as a scribe for Dr. Nicki Reaper C. Lehua Flores,  I have reviewed the above documentation for accuracy and completeness, and I agree with the above.   Abbie Sons, MD

## 2020-04-20 ENCOUNTER — Encounter: Payer: Self-pay | Admitting: Urology

## 2020-04-20 ENCOUNTER — Ambulatory Visit (INDEPENDENT_AMBULATORY_CARE_PROVIDER_SITE_OTHER): Payer: PPO | Admitting: Urology

## 2020-04-20 ENCOUNTER — Other Ambulatory Visit: Payer: Self-pay

## 2020-04-20 VITALS — BP 160/90 | HR 63 | Ht 68.0 in | Wt 161.0 lb

## 2020-04-20 DIAGNOSIS — N401 Enlarged prostate with lower urinary tract symptoms: Secondary | ICD-10-CM

## 2020-04-20 DIAGNOSIS — R3914 Feeling of incomplete bladder emptying: Secondary | ICD-10-CM | POA: Diagnosis not present

## 2020-04-20 LAB — BLADDER SCAN AMB NON-IMAGING: Scan Result: 180

## 2020-04-22 ENCOUNTER — Encounter: Payer: Self-pay | Admitting: Urology

## 2020-04-23 DIAGNOSIS — E1151 Type 2 diabetes mellitus with diabetic peripheral angiopathy without gangrene: Secondary | ICD-10-CM | POA: Diagnosis not present

## 2020-04-23 DIAGNOSIS — E782 Mixed hyperlipidemia: Secondary | ICD-10-CM | POA: Diagnosis not present

## 2020-04-30 DIAGNOSIS — E1151 Type 2 diabetes mellitus with diabetic peripheral angiopathy without gangrene: Secondary | ICD-10-CM | POA: Diagnosis not present

## 2020-04-30 DIAGNOSIS — E782 Mixed hyperlipidemia: Secondary | ICD-10-CM | POA: Diagnosis not present

## 2020-04-30 DIAGNOSIS — E538 Deficiency of other specified B group vitamins: Secondary | ICD-10-CM | POA: Diagnosis not present

## 2020-04-30 DIAGNOSIS — Z23 Encounter for immunization: Secondary | ICD-10-CM | POA: Diagnosis not present

## 2020-04-30 DIAGNOSIS — F0150 Vascular dementia without behavioral disturbance: Secondary | ICD-10-CM | POA: Diagnosis not present

## 2020-04-30 DIAGNOSIS — N3090 Cystitis, unspecified without hematuria: Secondary | ICD-10-CM | POA: Diagnosis not present

## 2020-04-30 DIAGNOSIS — Z125 Encounter for screening for malignant neoplasm of prostate: Secondary | ICD-10-CM | POA: Diagnosis not present

## 2020-05-13 DIAGNOSIS — R7309 Other abnormal glucose: Secondary | ICD-10-CM | POA: Diagnosis not present

## 2020-05-13 DIAGNOSIS — H401221 Low-tension glaucoma, left eye, mild stage: Secondary | ICD-10-CM | POA: Diagnosis not present

## 2020-05-13 DIAGNOSIS — Z961 Presence of intraocular lens: Secondary | ICD-10-CM | POA: Diagnosis not present

## 2020-05-13 DIAGNOSIS — H401212 Low-tension glaucoma, right eye, moderate stage: Secondary | ICD-10-CM | POA: Diagnosis not present

## 2020-05-19 DIAGNOSIS — R300 Dysuria: Secondary | ICD-10-CM | POA: Diagnosis not present

## 2020-06-03 DIAGNOSIS — N492 Inflammatory disorders of scrotum: Secondary | ICD-10-CM | POA: Diagnosis not present

## 2020-06-03 DIAGNOSIS — E1151 Type 2 diabetes mellitus with diabetic peripheral angiopathy without gangrene: Secondary | ICD-10-CM | POA: Diagnosis not present

## 2020-06-03 NOTE — Progress Notes (Signed)
06/04/2020 11:26 AM   David Myers April 10, 1932 [id number]  Referring provider: Rusty Aus, MD [facility] [place],  [place] [address] Chief Complaint  Patient presents with  . Testicle Pain    HPI: David Myers is a 86 y.o. male who has BPH with incomplete bladder emptying returns today for evaluation of a scrotal abscess.   -He was evaluated yesterday by his PA Mortimer Fries for tender swollen upper right scrotum. -Exam remarkable for tender, indurated right upper scrotum -He was treated with doxycycline. -Patient is feeling much better today, did note a minimal amount of drainage from the area last night   PMH: Past Medical History:  Diagnosis Date  . Diabetes mellitus without complication (HCC)    diet controlled  . History of hiatal hernia     Surgical History: Past Surgical History:  Procedure Laterality Date  . CATARACT EXTRACTION W/PHACO Left 08/12/2015   Procedure: CATARACT EXTRACTION PHACO AND INTRAOCULAR LENS PLACEMENT (IOC);  Surgeon: Leandrew Koyanagi, MD;  Location: [facility];  Service: Ophthalmology;  Laterality: Left;  DIABETIC - diet controlled TORIC  . CATARACT EXTRACTION W/PHACO Right 09/09/2015   Procedure: CATARACT EXTRACTION PHACO AND INTRAOCULAR LENS PLACEMENT (IOC);  Surgeon: Leandrew Koyanagi, MD;  Location: [facility];  Service: Ophthalmology;  Laterality: Right;  DIABETIC - diet controlled  . COLONOSCOPY    . WRIST SURGERY      Home Medications:  Allergies as of 06/04/2020      Reactions   Augmentin [amoxicillin-pot Clavulanate] Diarrhea   Led to C-diff       Medication List       Accurate as of June 04, 2020 11:26 AM. If you have any questions, ask your nurse or doctor.        aspirin EC 81 MG tablet Take 81 mg by mouth daily.   dorzolamide 2 % ophthalmic solution Commonly known as: TRUSOPT 1 drop 2 (two) times daily.   doxycycline 100 MG  tablet Commonly known as: VIBRA-TABS Take 100 mg by mouth 2 (two) times daily.   dutasteride 0.5 MG capsule Commonly known as: AVODART Take 0.5 mg by mouth daily.   latanoprost 0.005 % ophthalmic solution Commonly known as: XALATAN Place 1 drop into both eyes at bedtime.   losartan-hydrochlorothiazide 50-12.5 MG tablet Commonly known as: HYZAAR Take by mouth.   pantoprazole 40 MG tablet Commonly known as: PROTONIX Take 40 mg by mouth daily.   PROSTATE SUPPORT PO Take by mouth daily.   silodosin 8 MG Caps capsule Commonly known as: RAPAFLO TAKE 1 CAPSULE (8 MG TOTAL) BY MOUTH DAILY WITH BREAKFAST.   simvastatin 20 MG tablet Commonly known as: ZOCOR TAKE 1 TABLET BY MOUTH EVERY DAY AT NIGHT   tadalafil 10 MG tablet Commonly known as: CIALIS Take by mouth.   tamsulosin 0.4 MG Caps capsule Commonly known as: FLOMAX Take 0.4 mg by mouth daily.   Vitamin B12 1000 MCG Tbcr Take by mouth daily.   Vitamin D3 25 MCG (1000 UT) Caps Take 1,000 Units by mouth daily.       Allergies:  Allergies  Allergen Reactions  . Augmentin [Amoxicillin-Pot Clavulanate] Diarrhea    Led to C-diff     Family History: Family History  Problem Relation Age of Onset  . Hypertension Mother     Social History:  reports that he has never smoked. He has never used smokeless tobacco. He reports that he does not drink alcohol. No history  on file for drug use.   Physical Exam: BP (!) 149/83   Pulse 67   Ht 5\' 7"  (1.702 m)   Wt 167 lb (75.8 kg)   BMI 26.16 kg/m   Constitutional:  Alert and oriented, No acute distress. HEENT: [place] AT, moist mucus membranes.  Trachea midline, no masses. Cardiovascular: No clubbing, cyanosis, or edema. Respiratory: Normal respiratory effort, no increased work of breathing. GI: Abdomen is soft, nontender, nondistended, no abdominal masses GU: No CVA tenderness. Right upper scrotum with 2 cm indurated mass w mild erythema and no fluctuance. No scrotal skin  thickening.  Minimal tenderness Skin: As above Neurologic: Grossly intact, no focal deficits, moving all 4 extremities. Psychiatric: Normal mood and affect.   Assessment & Plan:    1.  Right scrotal wall mass - Most likely an infected inclusion cyst - No fluctuance or drainage.  - Improving on antibiotics - Discussed I&D which may not yield any drainage based on exam - He declines and would like to continue antibiotics - Follow up in 1 week with PA for reexam - Advise patient to return sooner if he has increased scrotal pain, erythema, urinary symptoms, fevers greater than 101 and/or chills.  - Recommended warm compresses or sitz bath   [facility] [address] [place],  [address] [phone]  I, Selena Batten, am acting as a scribe for Dr. Nicki Reaper C. Anthoni Geerts,  I have reviewed the above documentation for accuracy and completeness, and I agree with the above.   Abbie Sons, MD

## 2020-06-04 ENCOUNTER — Other Ambulatory Visit: Payer: Self-pay

## 2020-06-04 ENCOUNTER — Ambulatory Visit: Payer: PPO | Admitting: Urology

## 2020-06-04 ENCOUNTER — Encounter: Payer: Self-pay | Admitting: Urology

## 2020-06-04 VITALS — BP 149/83 | HR 67 | Ht 67.0 in | Wt 167.0 lb

## 2020-06-04 DIAGNOSIS — L723 Sebaceous cyst: Secondary | ICD-10-CM | POA: Diagnosis not present

## 2020-06-09 NOTE — Progress Notes (Signed)
06/04/2020 1:37 PM   David Myers Sep 11, 1931 [id number]  Referring provider: Rusty Aus, MD [facility] [place],  [place] [address] Chief Complaint  Patient presents with  . Follow-up    HPI: David Myers is a 86 y.o. male who has BPH with incomplete bladder emptying returns today for evaluation of a scrotal abscess.   -He was evaluated 06/03/2020 by his PA Mortimer Fries for tender swollen upper right scrotum. -Exam remarkable for tender, indurated right upper scrotum -He was treated with doxycycline. -At his visit on 06/04/2020 with Dr. Bernardo Heater, he was feeling better and had a minimal amount of drainage from the area the night before -He deferred I & D of the cyst and chose to continue antibiotics   He states the cystic area is smaller in size and less painful.  He states it still has a scant amount of drainage that he sees on his underwear.  He will take his last doses of doxycycline today.  His PVR today is 207 mL.  He is still up in the air about whether to pursue UroLift at this time.  He feels his urinary symptoms are not bothersome to him at this time.  Patient denies any modifying or aggravating factors.  Patient denies any gross hematuria, dysuria or suprapubic/flank pain.  Patient denies any fevers, chills, nausea or vomiting.    PMH: Past Medical History:  Diagnosis Date  . Diabetes mellitus without complication (HCC)    diet controlled  . History of hiatal hernia     Surgical History: Past Surgical History:  Procedure Laterality Date  . CATARACT EXTRACTION W/PHACO Left 08/12/2015   Procedure: CATARACT EXTRACTION PHACO AND INTRAOCULAR LENS PLACEMENT (IOC);  Surgeon: Leandrew Koyanagi, MD;  Location: [facility];  Service: Ophthalmology;  Laterality: Left;  DIABETIC - diet controlled TORIC  . CATARACT EXTRACTION W/PHACO Right 09/09/2015   Procedure: CATARACT EXTRACTION PHACO AND INTRAOCULAR LENS  PLACEMENT (IOC);  Surgeon: Leandrew Koyanagi, MD;  Location: [facility];  Service: Ophthalmology;  Laterality: Right;  DIABETIC - diet controlled  . COLONOSCOPY    . WRIST SURGERY      Home Medications:  Allergies as of 06/10/2020      Reactions   Augmentin [amoxicillin-pot Clavulanate] Diarrhea   Led to C-diff       Medication List       Accurate as of June 10, 2020  1:37 PM. If you have any questions, ask your nurse or doctor.        aspirin EC 81 MG tablet Take 81 mg by mouth daily.   dorzolamide 2 % ophthalmic solution Commonly known as: TRUSOPT 1 drop 2 (two) times daily.   doxycycline 100 MG tablet Commonly known as: VIBRA-TABS Take 100 mg by mouth 2 (two) times daily.   dutasteride 0.5 MG capsule Commonly known as: AVODART Take 0.5 mg by mouth daily.   famotidine 40 MG tablet Commonly known as: PEPCID Take 40 mg by mouth daily.   galantamine 4 MG tablet Commonly known as: RAZADYNE Take 4 mg by mouth 2 (two) times daily.   latanoprost 0.005 % ophthalmic solution Commonly known as: XALATAN Place 1 drop into both eyes at bedtime.   losartan-hydrochlorothiazide 50-12.5 MG tablet Commonly known as: HYZAAR Take by mouth.   pantoprazole 40 MG tablet Commonly known as: PROTONIX Take 40 mg by mouth daily.   PROSTATE SUPPORT PO Take by mouth daily.   silodosin 8 MG Caps capsule  Commonly known as: RAPAFLO TAKE 1 CAPSULE (8 MG TOTAL) BY MOUTH DAILY WITH BREAKFAST.   simvastatin 20 MG tablet Commonly known as: ZOCOR TAKE 1 TABLET BY MOUTH EVERY DAY AT NIGHT   tadalafil 10 MG tablet Commonly known as: CIALIS Take by mouth.   tamsulosin 0.4 MG Caps capsule Commonly known as: FLOMAX Take 0.4 mg by mouth daily.   Vitamin B12 1000 MCG Tbcr Take by mouth daily.   Vitamin D3 25 MCG (1000 UT) Caps Take 1,000 Units by mouth daily.       Allergies:  Allergies  Allergen Reactions  . Augmentin [Amoxicillin-Pot Clavulanate] Diarrhea     Led to C-diff     Family History: Family History  Problem Relation Age of Onset  . Hypertension Mother     Social History:  reports that he has never smoked. He has never used smokeless tobacco. He reports that he does not drink alcohol. No history on file for drug use.   Physical Exam: BP 117/73 (BP Location: Left Arm, Patient Position: Sitting, Cuff Size: Normal)   Pulse 76   Ht 5\' 8"  (1.727 m)   Wt 165 lb (74.8 kg)   BMI 25.09 kg/m   Constitutional:  Well nourished. Alert and oriented, No acute distress. HEENT: [place] AT, mask in place.  Trachea midline Cardiovascular: No clubbing, cyanosis, or edema. Respiratory: Normal respiratory effort, no increased work of breathing. GU: In the right upper scrotum there is a 1 1/2 cm indurated mass with mild erythema.  There is no fluctuance, crepitus or tenderness when palpating the area.  Testicles are located scrotally bilaterally. No masses are appreciated in the testicles. Left and right epididymis are normal. Neurologic: Grossly intact, no focal deficits, moving all 4 extremities. Psychiatric: Normal mood and affect.   Assessment & Plan:    1.  Right scrotal wall mass - Most likely an infected inclusion cyst - No fluctuance or drainage.  - Improving on antibiotics -He will return in two weeks for recheck - Advise patient to return sooner if he has increased scrotal pain, erythema, urinary symptoms, fevers greater than 101 and/or chills.  - Recommended warm compresses or sitz bath  2. BPH with LU TS -He is still undecided of about undergoing a UroLift procedure at this time -He would like to speak to somebody who has had UroLift, but unfortunately with HIPAA in place I am not able to give him patient's names -I suggested that if he is still indecisive regarding the UroLift procedure, it would probably be best not to move forward with that at this time until he is more certain about his decision -He will be returning in 2 weeks and  he may discuss it further at that time   [place] [address] [place], [place] [address] [phone]   Zara Council, PA-C

## 2020-06-10 ENCOUNTER — Ambulatory Visit: Payer: PPO | Admitting: Urology

## 2020-06-10 ENCOUNTER — Encounter: Payer: Self-pay | Admitting: Urology

## 2020-06-10 ENCOUNTER — Other Ambulatory Visit: Payer: Self-pay

## 2020-06-10 VITALS — BP 117/73 | HR 76 | Ht 68.0 in | Wt 165.0 lb

## 2020-06-10 DIAGNOSIS — R3914 Feeling of incomplete bladder emptying: Secondary | ICD-10-CM | POA: Diagnosis not present

## 2020-06-10 DIAGNOSIS — N401 Enlarged prostate with lower urinary tract symptoms: Secondary | ICD-10-CM

## 2020-06-10 DIAGNOSIS — L723 Sebaceous cyst: Secondary | ICD-10-CM | POA: Diagnosis not present

## 2020-06-10 LAB — BLADDER SCAN AMB NON-IMAGING

## 2020-06-23 NOTE — Progress Notes (Signed)
06/04/2020 11:21 AM   David Myers 1932-07-15 [id number]  Referring provider: Rusty Aus, MD [facility] [place],  [place] [address] Chief Complaint  Patient presents with  . Benign Prostatic Hypertrophy    HPI: David Myers is a 86 y.o. male who has BPH with incomplete bladder emptying returns today for evaluation of a scrotal abscess.   -He was evaluated 06/03/2020 by his PA Mortimer Fries for tender swollen upper right scrotum. -Exam remarkable for tender, indurated right upper scrotum -He was treated with doxycycline. -At his visit on 06/04/2020 with Dr. Bernardo Heater, he was feeling better and had a minimal amount of drainage from the area the night before -He deferred I & D of the cyst and chose to continue antibiotics   The area of concern in the scrotum has almost completely resolved.  He is no longer having tenderness in the area.    He is also still considering the UroLift.  He is also wondering if the UroLift would address his lack of ejaculation.    He continues to experience frequency, urgency, urge incontinence and a split urinary stream.  His PVR today is 216 mL.    PMH: Past Medical History:  Diagnosis Date  . Diabetes mellitus without complication (HCC)    diet controlled  . History of hiatal hernia     Surgical History: Past Surgical History:  Procedure Laterality Date  . CATARACT EXTRACTION W/PHACO Left 08/12/2015   Procedure: CATARACT EXTRACTION PHACO AND INTRAOCULAR LENS PLACEMENT (IOC);  Surgeon: Leandrew Koyanagi, MD;  Location: [facility];  Service: Ophthalmology;  Laterality: Left;  DIABETIC - diet controlled TORIC  . CATARACT EXTRACTION W/PHACO Right 09/09/2015   Procedure: CATARACT EXTRACTION PHACO AND INTRAOCULAR LENS PLACEMENT (IOC);  Surgeon: Leandrew Koyanagi, MD;  Location: [facility];  Service: Ophthalmology;  Laterality: Right;  DIABETIC - diet controlled  .  COLONOSCOPY    . WRIST SURGERY      Home Medications:  Allergies as of 06/24/2020      Reactions   Augmentin [amoxicillin-pot Clavulanate] Diarrhea   Led to C-diff       Medication List       Accurate as of June 24, 2020 11:21 AM. If you have any questions, ask your nurse or doctor.        STOP taking these medications   dutasteride 0.5 MG capsule Commonly known as: AVODART Stopped by: Ikram Riebe, PA-C     TAKE these medications   aspirin EC 81 MG tablet Take 81 mg by mouth daily.   dorzolamide 2 % ophthalmic solution Commonly known as: TRUSOPT 1 drop 2 (two) times daily.   doxycycline 100 MG tablet Commonly known as: VIBRA-TABS Take 100 mg by mouth 2 (two) times daily.   famotidine 40 MG tablet Commonly known as: PEPCID Take 40 mg by mouth daily.   galantamine 4 MG tablet Commonly known as: RAZADYNE Take 4 mg by mouth 2 (two) times daily.   latanoprost 0.005 % ophthalmic solution Commonly known as: XALATAN Place 1 drop into both eyes at bedtime.   losartan-hydrochlorothiazide 50-12.5 MG tablet Commonly known as: HYZAAR Take by mouth.   pantoprazole 40 MG tablet Commonly known as: PROTONIX Take 40 mg by mouth daily.   PROSTATE SUPPORT PO Take by mouth daily.   silodosin 8 MG Caps capsule Commonly known as: RAPAFLO TAKE 1 CAPSULE (8 MG TOTAL) BY MOUTH DAILY WITH BREAKFAST.   simvastatin 20 MG tablet Commonly  known as: ZOCOR TAKE 1 TABLET BY MOUTH EVERY DAY AT NIGHT   tadalafil 10 MG tablet Commonly known as: CIALIS Take by mouth.   tamsulosin 0.4 MG Caps capsule Commonly known as: FLOMAX Take 0.4 mg by mouth daily.   Vitamin B12 1000 MCG Tbcr Take by mouth daily.   Vitamin D3 25 MCG (1000 UT) Caps Take 1,000 Units by mouth daily.       Allergies:  Allergies  Allergen Reactions  . Augmentin [Amoxicillin-Pot Clavulanate] Diarrhea    Led to C-diff     Family History: Family History  Problem Relation Age of Onset  .  Hypertension Mother     Social History:  reports that he has never smoked. He has never used smokeless tobacco. He reports that he does not drink alcohol. No history on file for drug use.   Physical Exam: BP (!) 152/70   Pulse 74   Ht 5\' 8"  (1.727 m)   Wt 167 lb (75.8 kg)   BMI 25.39 kg/m   Constitutional:  Well nourished. Alert and oriented, No acute distress. HEENT: [place] AT, mask in place.  Trachea midline Cardiovascular: No clubbing, cyanosis, or edema. Respiratory: Normal respiratory effort, no increased work of breathing. GU: No CVA tenderness.  No bladder fullness or masses.  Patient with circumcised phallus.  Urethral meatus is patent.  No penile discharge. No penile lesions or rashes. Scrotum without lesions, cysts, rashes and/or edema.  The infected cyst in the right upper scrotum has cleared.  A small, non-tender area persists.  Testicles are located scrotally bilaterally. No masses are appreciated in the testicles. Left and right epididymis are normal. Neurologic: Grossly intact, no focal deficits, moving all 4 extremities. Psychiatric: Normal mood and affect.   Assessment & Plan:    1.  Right scrotal wall mass - was likely an infected sebaceous cyst that has now resolved  2. BPH with LU TS - he is still undecided regarding the UroLift procedure - Explained that the UroLift procedure and other bladder outlet procedures would not aid in his lack of ejaculation - RTC in three months for I PSS and PVR   3.  Ejaculatory disorder In relation to the ejaculatory disorder and the reduction of semen volume, I had a frank discussion with the patient that as a man ages and the prostate is enlarged  in size, it can be expected that the amount of semen and a forceful ejaculation can be reduced.  This can occur even without medication as the prostate will enlarge as a man ages.    [facility] [address] [place], [place] [address] [phone]   Zara Council, PA-C

## 2020-06-24 ENCOUNTER — Ambulatory Visit: Payer: PPO | Admitting: Urology

## 2020-06-24 ENCOUNTER — Encounter: Payer: Self-pay | Admitting: Urology

## 2020-06-24 ENCOUNTER — Other Ambulatory Visit: Payer: Self-pay

## 2020-06-24 VITALS — BP 152/70 | HR 74 | Ht 68.0 in | Wt 167.0 lb

## 2020-06-24 DIAGNOSIS — N5319 Other ejaculatory dysfunction: Secondary | ICD-10-CM

## 2020-06-24 DIAGNOSIS — R3914 Feeling of incomplete bladder emptying: Secondary | ICD-10-CM

## 2020-06-24 DIAGNOSIS — L723 Sebaceous cyst: Secondary | ICD-10-CM | POA: Diagnosis not present

## 2020-06-24 DIAGNOSIS — N401 Enlarged prostate with lower urinary tract symptoms: Secondary | ICD-10-CM

## 2020-06-24 LAB — BLADDER SCAN AMB NON-IMAGING: Scan Result: 216

## 2020-07-14 DIAGNOSIS — M25512 Pain in left shoulder: Secondary | ICD-10-CM | POA: Diagnosis not present

## 2020-07-14 DIAGNOSIS — M19012 Primary osteoarthritis, left shoulder: Secondary | ICD-10-CM | POA: Diagnosis not present

## 2020-07-14 DIAGNOSIS — J9811 Atelectasis: Secondary | ICD-10-CM | POA: Diagnosis not present

## 2020-07-14 DIAGNOSIS — E1151 Type 2 diabetes mellitus with diabetic peripheral angiopathy without gangrene: Secondary | ICD-10-CM | POA: Diagnosis not present

## 2020-07-14 DIAGNOSIS — R399 Unspecified symptoms and signs involving the genitourinary system: Secondary | ICD-10-CM | POA: Diagnosis not present

## 2020-07-27 ENCOUNTER — Ambulatory Visit: Payer: Self-pay | Admitting: Urology

## 2020-07-28 DIAGNOSIS — R7309 Other abnormal glucose: Secondary | ICD-10-CM | POA: Diagnosis not present

## 2020-07-28 DIAGNOSIS — H401212 Low-tension glaucoma, right eye, moderate stage: Secondary | ICD-10-CM | POA: Diagnosis not present

## 2020-07-28 DIAGNOSIS — H02132 Senile ectropion of right lower eyelid: Secondary | ICD-10-CM | POA: Diagnosis not present

## 2020-07-28 DIAGNOSIS — H10231 Serous conjunctivitis, except viral, right eye: Secondary | ICD-10-CM | POA: Diagnosis not present

## 2020-07-28 DIAGNOSIS — Z961 Presence of intraocular lens: Secondary | ICD-10-CM | POA: Diagnosis not present

## 2020-07-28 DIAGNOSIS — H401221 Low-tension glaucoma, left eye, mild stage: Secondary | ICD-10-CM | POA: Diagnosis not present

## 2020-07-28 DIAGNOSIS — H02135 Senile ectropion of left lower eyelid: Secondary | ICD-10-CM | POA: Diagnosis not present

## 2020-08-13 DIAGNOSIS — H401212 Low-tension glaucoma, right eye, moderate stage: Secondary | ICD-10-CM | POA: Diagnosis not present

## 2020-08-13 DIAGNOSIS — Z961 Presence of intraocular lens: Secondary | ICD-10-CM | POA: Diagnosis not present

## 2020-08-13 DIAGNOSIS — H10231 Serous conjunctivitis, except viral, right eye: Secondary | ICD-10-CM | POA: Diagnosis not present

## 2020-08-13 DIAGNOSIS — H02135 Senile ectropion of left lower eyelid: Secondary | ICD-10-CM | POA: Diagnosis not present

## 2020-08-13 DIAGNOSIS — H02132 Senile ectropion of right lower eyelid: Secondary | ICD-10-CM | POA: Diagnosis not present

## 2020-08-13 DIAGNOSIS — H401221 Low-tension glaucoma, left eye, mild stage: Secondary | ICD-10-CM | POA: Diagnosis not present

## 2020-08-13 DIAGNOSIS — R7309 Other abnormal glucose: Secondary | ICD-10-CM | POA: Diagnosis not present

## 2020-08-25 DIAGNOSIS — U071 COVID-19: Secondary | ICD-10-CM | POA: Diagnosis not present

## 2020-08-25 DIAGNOSIS — E1151 Type 2 diabetes mellitus with diabetic peripheral angiopathy without gangrene: Secondary | ICD-10-CM | POA: Diagnosis not present

## 2020-08-25 DIAGNOSIS — J208 Acute bronchitis due to other specified organisms: Secondary | ICD-10-CM | POA: Diagnosis not present

## 2020-08-28 DIAGNOSIS — K449 Diaphragmatic hernia without obstruction or gangrene: Secondary | ICD-10-CM | POA: Diagnosis not present

## 2020-08-28 DIAGNOSIS — J189 Pneumonia, unspecified organism: Secondary | ICD-10-CM | POA: Diagnosis not present

## 2020-08-28 DIAGNOSIS — J209 Acute bronchitis, unspecified: Secondary | ICD-10-CM | POA: Diagnosis not present

## 2020-08-28 DIAGNOSIS — U071 COVID-19: Secondary | ICD-10-CM | POA: Diagnosis not present

## 2020-08-28 DIAGNOSIS — R0600 Dyspnea, unspecified: Secondary | ICD-10-CM | POA: Diagnosis not present

## 2020-08-28 DIAGNOSIS — J208 Acute bronchitis due to other specified organisms: Secondary | ICD-10-CM | POA: Diagnosis not present

## 2020-09-11 DIAGNOSIS — E1151 Type 2 diabetes mellitus with diabetic peripheral angiopathy without gangrene: Secondary | ICD-10-CM | POA: Diagnosis not present

## 2020-09-11 DIAGNOSIS — K449 Diaphragmatic hernia without obstruction or gangrene: Secondary | ICD-10-CM | POA: Diagnosis not present

## 2020-09-11 DIAGNOSIS — J1282 Pneumonia due to coronavirus disease 2019: Secondary | ICD-10-CM | POA: Diagnosis not present

## 2020-09-11 DIAGNOSIS — J189 Pneumonia, unspecified organism: Secondary | ICD-10-CM | POA: Diagnosis not present

## 2020-09-11 DIAGNOSIS — U071 COVID-19: Secondary | ICD-10-CM | POA: Diagnosis not present

## 2020-09-11 DIAGNOSIS — J9811 Atelectasis: Secondary | ICD-10-CM | POA: Diagnosis not present

## 2020-09-21 ENCOUNTER — Ambulatory Visit: Payer: Self-pay | Admitting: Urology

## 2020-09-21 DIAGNOSIS — R300 Dysuria: Secondary | ICD-10-CM | POA: Diagnosis not present

## 2020-10-01 DIAGNOSIS — J1282 Pneumonia due to coronavirus disease 2019: Secondary | ICD-10-CM | POA: Diagnosis not present

## 2020-10-01 DIAGNOSIS — E1151 Type 2 diabetes mellitus with diabetic peripheral angiopathy without gangrene: Secondary | ICD-10-CM | POA: Diagnosis not present

## 2020-10-01 DIAGNOSIS — N3090 Cystitis, unspecified without hematuria: Secondary | ICD-10-CM | POA: Diagnosis not present

## 2020-10-01 DIAGNOSIS — U071 COVID-19: Secondary | ICD-10-CM | POA: Diagnosis not present

## 2020-10-01 DIAGNOSIS — J189 Pneumonia, unspecified organism: Secondary | ICD-10-CM | POA: Diagnosis not present

## 2020-10-15 ENCOUNTER — Other Ambulatory Visit: Payer: Self-pay | Admitting: Internal Medicine

## 2020-10-15 DIAGNOSIS — N3090 Cystitis, unspecified without hematuria: Secondary | ICD-10-CM

## 2020-10-15 DIAGNOSIS — A4151 Sepsis due to Escherichia coli [E. coli]: Secondary | ICD-10-CM

## 2020-10-16 DIAGNOSIS — N3090 Cystitis, unspecified without hematuria: Secondary | ICD-10-CM | POA: Diagnosis not present

## 2020-10-16 DIAGNOSIS — A4151 Sepsis due to Escherichia coli [E. coli]: Secondary | ICD-10-CM | POA: Diagnosis not present

## 2020-10-21 ENCOUNTER — Ambulatory Visit
Admission: RE | Admit: 2020-10-21 | Discharge: 2020-10-21 | Disposition: A | Discharge status: Home / Self Care | Payer: PPO | Source: Ambulatory Visit | Attending: Internal Medicine | Admitting: Internal Medicine

## 2020-10-21 ENCOUNTER — Other Ambulatory Visit: Payer: Self-pay

## 2020-10-21 DIAGNOSIS — N400 Enlarged prostate without lower urinary tract symptoms: Secondary | ICD-10-CM | POA: Insufficient documentation

## 2020-10-21 DIAGNOSIS — A419 Sepsis, unspecified organism: Secondary | ICD-10-CM | POA: Diagnosis not present

## 2020-10-21 DIAGNOSIS — N3090 Cystitis, unspecified without hematuria: Secondary | ICD-10-CM

## 2020-10-21 DIAGNOSIS — A4151 Sepsis due to Escherichia coli [E. coli]: Secondary | ICD-10-CM

## 2020-10-21 DIAGNOSIS — N200 Calculus of kidney: Secondary | ICD-10-CM | POA: Insufficient documentation

## 2020-10-21 DIAGNOSIS — N329 Bladder disorder, unspecified: Secondary | ICD-10-CM | POA: Diagnosis not present

## 2020-10-22 ENCOUNTER — Telehealth: Payer: Self-pay | Admitting: *Deleted

## 2020-10-22 ENCOUNTER — Encounter: Payer: Self-pay | Admitting: Urology

## 2020-10-22 ENCOUNTER — Ambulatory Visit: Payer: PPO | Admitting: Urology

## 2020-10-22 VITALS — BP 127/76 | HR 71 | Ht 68.0 in | Wt 160.0 lb

## 2020-10-22 DIAGNOSIS — N401 Enlarged prostate with lower urinary tract symptoms: Secondary | ICD-10-CM

## 2020-10-22 DIAGNOSIS — Z87438 Personal history of other diseases of male genital organs: Secondary | ICD-10-CM

## 2020-10-22 DIAGNOSIS — Z87440 Personal history of urinary (tract) infections: Secondary | ICD-10-CM | POA: Diagnosis not present

## 2020-10-22 DIAGNOSIS — R3914 Feeling of incomplete bladder emptying: Secondary | ICD-10-CM

## 2020-10-22 MED ORDER — MIRABEGRON ER 25 MG PO TB24
25.0000 mg | ORAL_TABLET | Freq: Every day | ORAL | 1 refills | Status: DC
Start: 2020-10-22 — End: 2020-10-22

## 2020-10-22 NOTE — Telephone Encounter (Signed)
Called patient to advise him the medication myrbetriq  Was a error. C/X order. Marland Kitchen He understands.

## 2020-10-23 ENCOUNTER — Other Ambulatory Visit: Payer: Self-pay | Admitting: Urology

## 2020-10-23 ENCOUNTER — Encounter: Payer: Self-pay | Admitting: Urology

## 2020-10-23 DIAGNOSIS — N401 Enlarged prostate with lower urinary tract symptoms: Secondary | ICD-10-CM

## 2020-10-23 LAB — MICROSCOPIC EXAMINATION: Bacteria, UA: NONE SEEN

## 2020-10-23 LAB — URINALYSIS, COMPLETE
Bilirubin, UA: NEGATIVE
Glucose, UA: NEGATIVE
Ketones, UA: NEGATIVE
Leukocytes,UA: NEGATIVE
Nitrite, UA: NEGATIVE
Protein,UA: NEGATIVE
RBC, UA: NEGATIVE
Specific Gravity, UA: 1.02 (ref 1.005–1.030)
Urobilinogen, Ur: 0.2 mg/dL (ref 0.2–1.0)
pH, UA: 6.5 (ref 5.0–7.5)

## 2020-10-23 NOTE — H&P (View-Only) (Signed)
10/22/2020 7:40 AM   David Myers [birth year] [id number]  Referring provider: Rusty Aus, MD [facility] [place],  [place] [address]  Chief Complaint  Patient presents with  . Recurrent UTI    HPI: 87 y.o. male presents for follow-up of BPH and recent UTI.   Incomplete bladder emptying with baseline PVR ~ 200 mL  Symptoms include hesitancy, slow urinary stream with postvoid dribbling, urgency and urge incontinence  On dutasteride  Cystoscopy 09/2019 with lateral lobe enlargement  TRUS volume 46 g  Call PCP regarding UTI symptoms late February 2022 and started on Cipro  Urine culture grew E. coli resistant to fluoroquinolones and he was switched to Macrobid  Developed chills and fever 10/14/2020 and antibiotic changed to cefdinir with improvement in symptoms  Urine culture 3/18 was negative  RUS 10/21/2020 showed renal cysts, a nonobstructing left renal calculus and a thickened bladder wall  Prostate volume calculated 62 cc  Patient was previously interested in [place] and with the recent infection does desire an outlet procedure   PMH: Past Medical History:  Diagnosis Date  . Diabetes mellitus without complication (HCC)    diet controlled  . History of hiatal hernia     Surgical History: Past Surgical History:  Procedure Laterality Date  . CATARACT EXTRACTION W/PHACO Left 08/12/2015   Procedure: CATARACT EXTRACTION PHACO AND INTRAOCULAR LENS PLACEMENT (IOC);  Surgeon: Leandrew Koyanagi, MD;  Location: [facility];  Service: Ophthalmology;  Laterality: Left;  DIABETIC - diet controlled TORIC  . CATARACT EXTRACTION W/PHACO Right 09/09/2015   Procedure: CATARACT EXTRACTION PHACO AND INTRAOCULAR LENS PLACEMENT (IOC);  Surgeon: Leandrew Koyanagi, MD;  Location: [facility];  Service: Ophthalmology;  Laterality: Right;  DIABETIC - diet controlled  . COLONOSCOPY    . WRIST SURGERY      Home  Medications:  Allergies as of 10/22/2020      Reactions   Augmentin [amoxicillin-pot Clavulanate] Diarrhea   Led to C-diff       Medication List       Accurate as of October 22, 2020 11:59 PM. If you have any questions, ask your nurse or doctor.        STOP taking these medications   budesonide 1 MG/2ML nebulizer solution Commonly known as: PULMICORT Stopped by: Abbie Sons, MD   galantamine 4 MG tablet Commonly known as: RAZADYNE Stopped by: Abbie Sons, MD   silodosin 8 MG Caps capsule Commonly known as: RAPAFLO Stopped by: Abbie Sons, MD   tadalafil 10 MG tablet Commonly known as: CIALIS Stopped by: Abbie Sons, MD     TAKE these medications   aspirin EC 81 MG tablet Take 81 mg by mouth daily.   cefdinir 300 MG capsule Commonly known as: OMNICEF Take by mouth.   dorzolamide 2 % ophthalmic solution Commonly known as: TRUSOPT 1 drop 2 (two) times daily.   doxycycline 100 MG tablet Commonly known as: VIBRA-TABS Take 100 mg by mouth 2 (two) times daily.   famotidine 40 MG tablet Commonly known as: PEPCID Take 40 mg by mouth daily.   latanoprost 0.005 % ophthalmic solution Commonly known as: XALATAN Place 1 drop into both eyes at bedtime.   losartan-hydrochlorothiazide 50-12.5 MG tablet Commonly known as: HYZAAR Take by mouth.   pantoprazole 40 MG tablet Commonly known as: PROTONIX Take 40 mg by mouth daily.   PROSTATE SUPPORT PO Take by mouth daily.   simvastatin 20 MG tablet Commonly  known as: ZOCOR TAKE 1 TABLET BY MOUTH EVERY DAY AT NIGHT   tamsulosin 0.4 MG Caps capsule Commonly known as: FLOMAX Take 0.4 mg by mouth daily.   Vitamin B12 1000 MCG Tbcr Take by mouth daily.   Vitamin D3 25 MCG (1000 UT) Caps Take 1,000 Units by mouth daily.       Allergies:  Allergies  Allergen Reactions  . Augmentin [Amoxicillin-Pot Clavulanate] Diarrhea    Led to C-diff     Family History: Family History  Problem Relation Age  of Onset  . Hypertension Mother     Social History:  reports that he has never smoked. He has never used smokeless tobacco. He reports that he does not drink alcohol. No history on file for drug use.   Physical Exam: BP 127/76   Pulse 71   Ht 5\' 8"  (1.727 m)   Wt 160 lb (72.6 kg)   BMI 24.33 kg/m   Constitutional:  Alert and oriented, No acute distress. HEENT: [place] AT, moist mucus membranes.  Trachea midline, no masses. Cardiovascular: No clubbing, cyanosis, or edema. Respiratory: Normal respiratory effort, no increased work of breathing. GI: Abdomen is soft, nontender, nondistended, no abdominal masses GU: No CVA tenderness Lymph: No cervical or inguinal lymphadenopathy. Skin: No rashes, bruises or suspicious lesions. Neurologic: Grossly intact, no focal deficits, moving all 4 extremities. Psychiatric: Normal mood and affect.  Laboratory Data:  Urinalysis Dipstick/microscopy negative  Pertinent Imaging: Ultrasound images were personally reviewed and interpreted  US RENAL  Narrative CLINICAL DATA:  Patient with cystitis and sepsis.  EXAM: RENAL / URINARY TRACT ULTRASOUND COMPLETE  COMPARISON:  CT abdomen and pelvis 02/12/2014.  FINDINGS: Right Kidney:  Renal measurements: 10.3 x 5.9 x 4.4 cm = volume: 130 mL. Echogenicity within normal limits. Two small simple cysts are identified. The larger measures 1.5 cm no solid mass or hydronephrosis visualized.  Left Kidney:  Renal measurements: 10.0 x 5.8 x 4.5 cm = volume: 137 mL. Echogenicity within normal limits. No mass or hydronephrosis visualized. 1.9 cm echogenic focus with posterior acoustic shadowing in the lower pole correlates with the nonobstructing stone seen on the prior CT.  Bladder:  The urinary bladder wall appears mildly irregular and thickened. A similar appearance is seen on the prior CT.  Other:  The prostate measures 4.4 x 4.8 x 5.6 cm for a volume of 62.5 mL.  IMPRESSION: No acute  abnormality.  Likely 1.9 cm nonobstructing stone lower pole left kidney. Chronic thickening and irregularity of the urinary bladder wall most suggestive of bladder outlet obstruction.  Prostatomegaly.   Electronically Signed By: Inge Rise M.D. On: 10/22/2020 09:35   Assessment & Plan:    1.  BPH with incomplete bladder emptying  He is interested in scheduling UroLift however with his recent UTI the source was most likely a prostatic infection and field TURP may be a better option with tissue resection rather than placing a foreign body within infected prostate tissue.  We discussed that any aloe procedure may not improve his bladder emptying due to bladder hypotonicity.  Potential risks/side effects of TURP were discussed including bleeding, infection/sepsis, urethral stricture/bladder neck contracture.  The common occurrence of retrograde ejaculation was discussed at >70% and low incidence of urinary incontinence.  All questions were answered and he desires to schedule  2.  Prostatitis  Prostate was the most likely source of his infection  Since nitrofurantoin does not achieve good tissue penetration I suspect this is the reasoning for the worsening  symptoms requiring an antibiotic change   Abbie Sons, MD  [facility] [address], [place], [place] [address] [phone]

## 2020-10-23 NOTE — Progress Notes (Signed)
10/22/2020 7:40 AM   David Myers 1931/12/24 [id number]  Referring provider: Rusty Aus, MD [facility] [place],  [place] [address]  Chief Complaint  Patient presents with  . Recurrent UTI    HPI: 87 y.o. male presents for follow-up of BPH and recent UTI.   Incomplete bladder emptying with baseline PVR ~ 200 mL  Symptoms include hesitancy, slow urinary stream with postvoid dribbling, urgency and urge incontinence  On dutasteride  Cystoscopy 09/2019 with lateral lobe enlargement  TRUS volume 46 g  Call PCP regarding UTI symptoms late February 2022 and started on Cipro  Urine culture grew E. coli resistant to fluoroquinolones and he was switched to Macrobid  Developed chills and fever 10/14/2020 and antibiotic changed to cefdinir with improvement in symptoms  Urine culture 3/18 was negative  RUS 10/21/2020 showed renal cysts, a nonobstructing left renal calculus and a thickened bladder wall  Prostate volume calculated 62 cc  Patient was previously interested in [place] and with the recent infection does desire an outlet procedure   PMH: Past Medical History:  Diagnosis Date  . Diabetes mellitus without complication (HCC)    diet controlled  . History of hiatal hernia     Surgical History: Past Surgical History:  Procedure Laterality Date  . CATARACT EXTRACTION W/PHACO Left 08/12/2015   Procedure: CATARACT EXTRACTION PHACO AND INTRAOCULAR LENS PLACEMENT (IOC);  Surgeon: Leandrew Koyanagi, MD;  Location: [facility];  Service: Ophthalmology;  Laterality: Left;  DIABETIC - diet controlled TORIC  . CATARACT EXTRACTION W/PHACO Right 09/09/2015   Procedure: CATARACT EXTRACTION PHACO AND INTRAOCULAR LENS PLACEMENT (IOC);  Surgeon: Leandrew Koyanagi, MD;  Location: [facility];  Service: Ophthalmology;  Laterality: Right;  DIABETIC - diet controlled  . COLONOSCOPY    . WRIST SURGERY      Home  Medications:  Allergies as of 10/22/2020      Reactions   Augmentin [amoxicillin-pot Clavulanate] Diarrhea   Led to C-diff       Medication List       Accurate as of October 22, 2020 11:59 PM. If you have any questions, ask your nurse or doctor.        STOP taking these medications   budesonide 1 MG/2ML nebulizer solution Commonly known as: PULMICORT Stopped by: Abbie Sons, MD   galantamine 4 MG tablet Commonly known as: RAZADYNE Stopped by: Abbie Sons, MD   silodosin 8 MG Caps capsule Commonly known as: RAPAFLO Stopped by: Abbie Sons, MD   tadalafil 10 MG tablet Commonly known as: CIALIS Stopped by: Abbie Sons, MD     TAKE these medications   aspirin EC 81 MG tablet Take 81 mg by mouth daily.   cefdinir 300 MG capsule Commonly known as: OMNICEF Take by mouth.   dorzolamide 2 % ophthalmic solution Commonly known as: TRUSOPT 1 drop 2 (two) times daily.   doxycycline 100 MG tablet Commonly known as: VIBRA-TABS Take 100 mg by mouth 2 (two) times daily.   famotidine 40 MG tablet Commonly known as: PEPCID Take 40 mg by mouth daily.   latanoprost 0.005 % ophthalmic solution Commonly known as: XALATAN Place 1 drop into both eyes at bedtime.   losartan-hydrochlorothiazide 50-12.5 MG tablet Commonly known as: HYZAAR Take by mouth.   pantoprazole 40 MG tablet Commonly known as: PROTONIX Take 40 mg by mouth daily.   PROSTATE SUPPORT PO Take by mouth daily.   simvastatin 20 MG tablet Commonly  known as: ZOCOR TAKE 1 TABLET BY MOUTH EVERY DAY AT NIGHT   tamsulosin 0.4 MG Caps capsule Commonly known as: FLOMAX Take 0.4 mg by mouth daily.   Vitamin B12 1000 MCG Tbcr Take by mouth daily.   Vitamin D3 25 MCG (1000 UT) Caps Take 1,000 Units by mouth daily.       Allergies:  Allergies  Allergen Reactions  . Augmentin [Amoxicillin-Pot Clavulanate] Diarrhea    Led to C-diff     Family History: Family History  Problem Relation Age  of Onset  . Hypertension Mother     Social History:  reports that he has never smoked. He has never used smokeless tobacco. He reports that he does not drink alcohol. No history on file for drug use.   Physical Exam: BP 127/76   Pulse 71   Ht 5\' 8"  (1.727 m)   Wt 160 lb (72.6 kg)   BMI 24.33 kg/m   Constitutional:  Alert and oriented, No acute distress. HEENT: [place] AT, moist mucus membranes.  Trachea midline, no masses. Cardiovascular: No clubbing, cyanosis, or edema. Respiratory: Normal respiratory effort, no increased work of breathing. GI: Abdomen is soft, nontender, nondistended, no abdominal masses GU: No CVA tenderness Lymph: No cervical or inguinal lymphadenopathy. Skin: No rashes, bruises or suspicious lesions. Neurologic: Grossly intact, no focal deficits, moving all 4 extremities. Psychiatric: Normal mood and affect.  Laboratory Data:  Urinalysis Dipstick/microscopy negative  Pertinent Imaging: Ultrasound images were personally reviewed and interpreted  US RENAL  Narrative CLINICAL DATA:  Patient with cystitis and sepsis.  EXAM: RENAL / URINARY TRACT ULTRASOUND COMPLETE  COMPARISON:  CT abdomen and pelvis 02/12/2014.  FINDINGS: Right Kidney:  Renal measurements: 10.3 x 5.9 x 4.4 cm = volume: 130 mL. Echogenicity within normal limits. Two small simple cysts are identified. The larger measures 1.5 cm no solid mass or hydronephrosis visualized.  Left Kidney:  Renal measurements: 10.0 x 5.8 x 4.5 cm = volume: 137 mL. Echogenicity within normal limits. No mass or hydronephrosis visualized. 1.9 cm echogenic focus with posterior acoustic shadowing in the lower pole correlates with the nonobstructing stone seen on the prior CT.  Bladder:  The urinary bladder wall appears mildly irregular and thickened. A similar appearance is seen on the prior CT.  Other:  The prostate measures 4.4 x 4.8 x 5.6 cm for a volume of 62.5 mL.  IMPRESSION: No acute  abnormality.  Likely 1.9 cm nonobstructing stone lower pole left kidney. Chronic thickening and irregularity of the urinary bladder wall most suggestive of bladder outlet obstruction.  Prostatomegaly.   Electronically Signed By: Inge Rise M.D. On: 10/22/2020 09:35   Assessment & Plan:    1.  BPH with incomplete bladder emptying  He is interested in scheduling UroLift however with his recent UTI the source was most likely a prostatic infection and field TURP may be a better option with tissue resection rather than placing a foreign body within infected prostate tissue.  We discussed that any aloe procedure may not improve his bladder emptying due to bladder hypotonicity.  Potential risks/side effects of TURP were discussed including bleeding, infection/sepsis, urethral stricture/bladder neck contracture.  The common occurrence of retrograde ejaculation was discussed at >70% and low incidence of urinary incontinence.  All questions were answered and he desires to schedule  2.  Prostatitis  Prostate was the most likely source of his infection  Since nitrofurantoin does not achieve good tissue penetration I suspect this is the reasoning for the worsening  symptoms requiring an antibiotic change   Abbie Sons, MD  [facility] [address] [place], [place] [address] [phone]

## 2020-10-30 DIAGNOSIS — Z125 Encounter for screening for malignant neoplasm of prostate: Secondary | ICD-10-CM | POA: Diagnosis not present

## 2020-10-30 DIAGNOSIS — E1151 Type 2 diabetes mellitus with diabetic peripheral angiopathy without gangrene: Secondary | ICD-10-CM | POA: Diagnosis not present

## 2020-10-30 DIAGNOSIS — E538 Deficiency of other specified B group vitamins: Secondary | ICD-10-CM | POA: Diagnosis not present

## 2020-11-06 DIAGNOSIS — E1151 Type 2 diabetes mellitus with diabetic peripheral angiopathy without gangrene: Secondary | ICD-10-CM | POA: Diagnosis not present

## 2020-11-06 DIAGNOSIS — R059 Cough, unspecified: Secondary | ICD-10-CM | POA: Diagnosis not present

## 2020-11-06 DIAGNOSIS — Z0000 Encounter for general adult medical examination without abnormal findings: Secondary | ICD-10-CM | POA: Diagnosis not present

## 2020-11-06 DIAGNOSIS — M48062 Spinal stenosis, lumbar region with neurogenic claudication: Secondary | ICD-10-CM | POA: Diagnosis not present

## 2020-11-06 DIAGNOSIS — R053 Chronic cough: Secondary | ICD-10-CM | POA: Diagnosis not present

## 2020-11-06 DIAGNOSIS — K449 Diaphragmatic hernia without obstruction or gangrene: Secondary | ICD-10-CM | POA: Diagnosis not present

## 2020-11-09 ENCOUNTER — Other Ambulatory Visit: Payer: Self-pay

## 2020-11-09 ENCOUNTER — Other Ambulatory Visit
Admission: RE | Admit: 2020-11-09 | Discharge: 2020-11-09 | Disposition: A | Discharge status: Home / Self Care | Payer: PPO | Attending: Urology | Admitting: Urology

## 2020-11-09 DIAGNOSIS — H401221 Low-tension glaucoma, left eye, mild stage: Secondary | ICD-10-CM | POA: Diagnosis not present

## 2020-11-09 DIAGNOSIS — R3914 Feeling of incomplete bladder emptying: Secondary | ICD-10-CM | POA: Insufficient documentation

## 2020-11-09 DIAGNOSIS — H02132 Senile ectropion of right lower eyelid: Secondary | ICD-10-CM | POA: Diagnosis not present

## 2020-11-09 DIAGNOSIS — H401212 Low-tension glaucoma, right eye, moderate stage: Secondary | ICD-10-CM | POA: Diagnosis not present

## 2020-11-09 DIAGNOSIS — N401 Enlarged prostate with lower urinary tract symptoms: Secondary | ICD-10-CM | POA: Diagnosis not present

## 2020-11-09 DIAGNOSIS — H02135 Senile ectropion of left lower eyelid: Secondary | ICD-10-CM | POA: Diagnosis not present

## 2020-11-09 DIAGNOSIS — Z961 Presence of intraocular lens: Secondary | ICD-10-CM | POA: Diagnosis not present

## 2020-11-09 DIAGNOSIS — R7309 Other abnormal glucose: Secondary | ICD-10-CM | POA: Diagnosis not present

## 2020-11-10 ENCOUNTER — Other Ambulatory Visit
Admission: RE | Admit: 2020-11-10 | Discharge: 2020-11-10 | Disposition: A | Discharge status: Home / Self Care | Payer: PPO | Source: Ambulatory Visit | Attending: Urology | Admitting: Urology

## 2020-11-10 HISTORY — DX: Essential (primary) hypertension: I10

## 2020-11-10 HISTORY — DX: Personal history of urinary calculi: Z87.442

## 2020-11-10 HISTORY — DX: Unspecified osteoarthritis, unspecified site: M19.90

## 2020-11-10 HISTORY — DX: Sleep apnea, unspecified: G47.30

## 2020-11-10 HISTORY — DX: Gastro-esophageal reflux disease without esophagitis: K21.9

## 2020-11-10 HISTORY — DX: Cerebral infarction, unspecified: I63.9

## 2020-11-10 LAB — URINE CULTURE: Culture: <10000 — AB

## 2020-11-10 NOTE — Patient Instructions (Signed)
Your procedure is scheduled on: Tuesday November 17, 2020 Report to Day Surgery inside [facility] 2nd floor, (stop by admissions desk before getting on elevator. To find out your arrival time please call [phone] between 1PM - 3PM on Monday November 16, 2020.  Remember: Instructions that are not followed completely may result in serious medical risk,  up to and including death, or upon the discretion of your surgeon and anesthesiologist your  surgery may need to be rescheduled.     _X__ 1. Do not eat food or drink fluids after midnight the night before your procedure.                 No chewing gum or hard candies.   __X__2.  On the morning of surgery brush your teeth with toothpaste and water, you                may rinse your mouth with mouthwash if you wish.  Do not swallow any toothpaste of mouthwash.     _X__ 3.  No Alcohol for 24 hours before or after surgery.   _X__ 4.  Do Not Smoke or use e-cigarettes For 24 Hours Prior to Your Surgery.                 Do not use any chewable tobacco products for at least 6 hours prior to                 Surgery.  _X__  5.  Do not use any recreational drugs (marijuana, cocaine, heroin, ecstasy, MDMA or other)                For at least one week prior to your surgery.  Combination of these drugs with anesthesia                May have life threatening results.  __X__ 6.  Notify your doctor if there is any change in your medical condition      (cold, fever, infections).     Do not wear jewelry, make-up, hairpins, clips or nail polish. Do not wear lotions, powders, or perfumes. You may wear deodorant. Do not shave 48 hours prior to surgery. Men may shave face and neck. Do not bring valuables to the hospital.    [facility] is not responsible for any belongings or valuables.  Contacts, dentures or bridgework may not be worn into surgery. Leave your suitcase in the car. After surgery it may be brought to your room. For  patients admitted to the hospital, discharge time is determined by your treatment team.   Patients discharged the day of surgery will not be allowed to drive home.   Make arrangements for someone to be with you for the first 24 hours of your Same Day Discharge.   __X__ Take these medicines the morning of surgery with A SIP OF WATER:    1. pantoprazole (PROTONIX) 40 MG  2.   3.   4.  5.  6.  ____ Fleet Enema (as directed)   ____ Use CHG Soap (or wipes) as directed  ____ Use Benzoyl Peroxide Gel as instructed  ____ Use inhalers on the day of surgery  ____ Stop metformin 2 days prior to surgery    ____ Take 1/2 of usual insulin dose the night before surgery. No insulin the morning          of surgery.   __X__ Call your PCP Dr. Sabra Heck and ask him when  he wants you to stop taking aspirin EC 81 MG before your procedure  __X__ Stop Anti-inflammatories such as Ibuprofen, Aleve, Advil naproxen, Motrin, Goody's or BC powders.    __X__ Stop supplements until after surgery.    __X__ Do not start any herbal supplements before your procedure.    If you have any questions regarding your pre-procedure instructions,  Please call Pre-admit Testing at [phone].

## 2020-11-11 ENCOUNTER — Other Ambulatory Visit: Payer: Self-pay

## 2020-11-11 ENCOUNTER — Other Ambulatory Visit
Admission: RE | Admit: 2020-11-11 | Discharge: 2020-11-11 | Disposition: A | Discharge status: Home / Self Care | Payer: PPO | Source: Ambulatory Visit | Attending: Urology | Admitting: Urology

## 2020-11-11 DIAGNOSIS — I10 Essential (primary) hypertension: Secondary | ICD-10-CM | POA: Insufficient documentation

## 2020-11-11 DIAGNOSIS — Z01818 Encounter for other preprocedural examination: Secondary | ICD-10-CM | POA: Diagnosis not present

## 2020-11-11 DIAGNOSIS — Z01810 Encounter for preprocedural cardiovascular examination: Secondary | ICD-10-CM | POA: Diagnosis not present

## 2020-11-13 ENCOUNTER — Other Ambulatory Visit: Payer: PPO

## 2020-11-17 ENCOUNTER — Ambulatory Visit: Payer: PPO | Admitting: Certified Registered Nurse Anesthetist

## 2020-11-17 ENCOUNTER — Other Ambulatory Visit: Payer: Self-pay

## 2020-11-17 ENCOUNTER — Encounter
Admission: RE | Disposition: A | Discharge status: Home / Self Care | Payer: Self-pay | Source: Home / Self Care | Attending: Urology

## 2020-11-17 ENCOUNTER — Encounter: Payer: Self-pay | Admitting: Urology

## 2020-11-17 ENCOUNTER — Observation Stay
Admission: RE | Admit: 2020-11-17 | Discharge: 2020-11-18 | Disposition: A | Discharge status: Home / Self Care | Payer: PPO | Attending: Urology | Admitting: Urology

## 2020-11-17 DIAGNOSIS — N419 Inflammatory disease of prostate, unspecified: Secondary | ICD-10-CM | POA: Insufficient documentation

## 2020-11-17 DIAGNOSIS — F0150 Vascular dementia without behavioral disturbance: Secondary | ICD-10-CM | POA: Insufficient documentation

## 2020-11-17 DIAGNOSIS — Z20822 Contact with and (suspected) exposure to covid-19: Secondary | ICD-10-CM | POA: Diagnosis not present

## 2020-11-17 DIAGNOSIS — Z9079 Acquired absence of other genital organ(s): Secondary | ICD-10-CM

## 2020-11-17 DIAGNOSIS — R3914 Feeling of incomplete bladder emptying: Secondary | ICD-10-CM | POA: Diagnosis not present

## 2020-11-17 DIAGNOSIS — N401 Enlarged prostate with lower urinary tract symptoms: Secondary | ICD-10-CM | POA: Diagnosis not present

## 2020-11-17 DIAGNOSIS — N3289 Other specified disorders of bladder: Secondary | ICD-10-CM | POA: Diagnosis not present

## 2020-11-17 DIAGNOSIS — E119 Type 2 diabetes mellitus without complications: Secondary | ICD-10-CM | POA: Diagnosis not present

## 2020-11-17 DIAGNOSIS — N323 Diverticulum of bladder: Secondary | ICD-10-CM | POA: Diagnosis not present

## 2020-11-17 HISTORY — PX: TRANSURETHRAL RESECTION OF PROSTATE: SHX73

## 2020-11-17 LAB — GLUCOSE, CAPILLARY
Glucose-Capillary: 135 mg/dL — ABNORMAL HIGH (ref 70–99)
Glucose-Capillary: 133 mg/dL — ABNORMAL HIGH (ref 70–99)
Glucose-Capillary: 161 mg/dL — ABNORMAL HIGH (ref 70–99)

## 2020-11-17 LAB — CBC
HCT: 39.1 % (ref 39.0–52.0)
Hemoglobin: 13.1 g/dL (ref 13.0–17.0)
MCH: 32 pg (ref 26.0–34.0)
MCHC: 33.5 g/dL (ref 30.0–36.0)
MCV: 95.6 fL (ref 80.0–100.0)
Platelets: 199 10*3/uL (ref 150–400)
RBC: 4.09 MIL/uL — ABNORMAL LOW (ref 4.22–5.81)
RDW: 14.3 % (ref 11.5–15.5)
WBC: 7.9 10*3/uL (ref 4.0–10.5)
nRBC: 0 % (ref 0.0–0.2)

## 2020-11-17 LAB — CREATININE, SERUM
Creatinine, Ser: 1.25 mg/dL — ABNORMAL HIGH (ref 0.61–1.24)
GFR, Estimated: 56 mL/min — ABNORMAL LOW (ref >60)

## 2020-11-17 LAB — SARS CORONAVIRUS 2 BY RT PCR (HOSPITAL ORDER, PERFORMED IN ~~LOC~~ HOSPITAL LAB): SARS Coronavirus 2: NEGATIVE

## 2020-11-17 SURGERY — TURP (TRANSURETHRAL RESECTION OF PROSTATE)
Anesthesia: General

## 2020-11-17 MED ORDER — LIDOCAINE HCL (PF) 2 % IJ SOLN
INTRAMUSCULAR | Status: AC
  Filled 2020-11-17: qty 5

## 2020-11-17 MED ORDER — BELLADONNA ALKALOIDS-OPIUM 16.2-60 MG RE SUPP: 1.0000 | Freq: Four times a day (QID) | RECTAL | Status: DC | PRN

## 2020-11-17 MED ORDER — PHENYLEPHRINE HCL (PRESSORS) 10 MG/ML IV SOLN
INTRAVENOUS | Status: DC | PRN
  Administered 2020-11-17 (×2): 100 ug via INTRAVENOUS

## 2020-11-17 MED ORDER — NETARSUDIL-LATANOPROST 0.02-0.005 % OP SOLN: 1.0000 [drp] | Freq: Every day | OPHTHALMIC | Status: DC

## 2020-11-17 MED ORDER — LOSARTAN POTASSIUM-HCTZ 50-12.5 MG PO TABS: 0.5000 | ORAL_TABLET | Freq: Every day | ORAL | Status: DC

## 2020-11-17 MED ORDER — ONDANSETRON HCL 4 MG/2ML IJ SOLN
INTRAMUSCULAR | Status: DC | PRN
  Administered 2020-11-17: 4 mg via INTRAVENOUS

## 2020-11-17 MED ORDER — LOSARTAN POTASSIUM 25 MG PO TABS
25.0000 mg | ORAL_TABLET | Freq: Every day | ORAL | Status: DC
  Administered 2020-11-17: 25 mg via ORAL
  Filled 2020-11-17: qty 1

## 2020-11-17 MED ORDER — DEXAMETHASONE SODIUM PHOSPHATE 10 MG/ML IJ SOLN
INTRAMUSCULAR | Status: DC | PRN
  Administered 2020-11-17: 10 mg via INTRAVENOUS

## 2020-11-17 MED ORDER — LIDOCAINE HCL (CARDIAC) PF 100 MG/5ML IV SOSY
PREFILLED_SYRINGE | INTRAVENOUS | Status: DC | PRN
  Administered 2020-11-17: 100 mg via INTRAVENOUS

## 2020-11-17 MED ORDER — PROPOFOL 10 MG/ML IV BOLUS
INTRAVENOUS | Status: AC
  Filled 2020-11-17: qty 20

## 2020-11-17 MED ORDER — PROPOFOL 10 MG/ML IV BOLUS
INTRAVENOUS | Status: DC | PRN
  Administered 2020-11-17: 110 mg via INTRAVENOUS

## 2020-11-17 MED ORDER — PANTOPRAZOLE SODIUM 40 MG PO TBEC
40.0000 mg | DELAYED_RELEASE_TABLET | Freq: Every day | ORAL | Status: DC
  Administered 2020-11-18: 40 mg via ORAL
  Filled 2020-11-17: qty 1

## 2020-11-17 MED ORDER — HYDROCHLOROTHIAZIDE 12.5 MG PO CAPS
12.5000 mg | ORAL_CAPSULE | Freq: Every day | ORAL | Status: DC
  Administered 2020-11-17: 12.5 mg via ORAL
  Filled 2020-11-17: qty 1

## 2020-11-17 MED ORDER — SODIUM CHLORIDE 0.9 % IV SOLN: INTRAVENOUS | Status: DC

## 2020-11-17 MED ORDER — ACETAMINOPHEN 325 MG PO TABS: 650.0000 mg | ORAL_TABLET | ORAL | Status: DC | PRN

## 2020-11-17 MED ORDER — ORAL CARE MOUTH RINSE: 15.0000 mL | Freq: Once | OROMUCOSAL | Status: AC

## 2020-11-17 MED ORDER — ACETAMINOPHEN 10 MG/ML IV SOLN
INTRAVENOUS | Status: DC | PRN
  Administered 2020-11-17: 1000 mg via INTRAVENOUS

## 2020-11-17 MED ORDER — DEXAMETHASONE SODIUM PHOSPHATE 10 MG/ML IJ SOLN
INTRAMUSCULAR | Status: AC
  Filled 2020-11-17: qty 3

## 2020-11-17 MED ORDER — SODIUM CHLORIDE 0.9 % IV SOLN
1.0000 g | Freq: Three times a day (TID) | INTRAVENOUS | Status: AC
  Administered 2020-11-17 – 2020-11-18 (×2): 1 g via INTRAVENOUS
  Filled 2020-11-17 (×2): qty 10

## 2020-11-17 MED ORDER — SODIUM CHLORIDE 0.9 % IR SOLN
3000.0000 mL | Status: DC
  Administered 2020-11-18: 3000 mL

## 2020-11-17 MED ORDER — FENTANYL CITRATE (PF) 100 MCG/2ML IJ SOLN: 25.0000 ug | INTRAMUSCULAR | Status: DC | PRN

## 2020-11-17 MED ORDER — FENTANYL CITRATE (PF) 100 MCG/2ML IJ SOLN
INTRAMUSCULAR | Status: DC | PRN
  Administered 2020-11-17: 25 ug via INTRAVENOUS
  Administered 2020-11-17: 50 ug via INTRAVENOUS
  Administered 2020-11-17: 25 ug via INTRAVENOUS
  Administered 2020-11-17 (×2): 50 ug via INTRAVENOUS

## 2020-11-17 MED ORDER — SODIUM CHLORIDE 0.9 % IV SOLN
1.0000 g | INTRAVENOUS | Status: AC
  Administered 2020-11-17: 1 g via INTRAVENOUS
  Filled 2020-11-17: qty 1

## 2020-11-17 MED ORDER — ONDANSETRON HCL 4 MG/2ML IJ SOLN: 4.0000 mg | Freq: Once | INTRAMUSCULAR | Status: DC | PRN

## 2020-11-17 MED ORDER — CHLORHEXIDINE GLUCONATE 0.12 % MT SOLN: 15.0000 mL | Freq: Once | OROMUCOSAL | Status: AC

## 2020-11-17 MED ORDER — FENTANYL CITRATE (PF) 100 MCG/2ML IJ SOLN
INTRAMUSCULAR | Status: AC
  Filled 2020-11-17: qty 2

## 2020-11-17 MED ORDER — GLYCOPYRROLATE 0.2 MG/ML IJ SOLN
INTRAMUSCULAR | Status: AC
  Filled 2020-11-17: qty 4

## 2020-11-17 MED ORDER — EPHEDRINE SULFATE 50 MG/ML IJ SOLN
INTRAMUSCULAR | Status: DC | PRN
  Administered 2020-11-17 (×2): 10 mg via INTRAVENOUS

## 2020-11-17 MED ORDER — ACETAMINOPHEN 10 MG/ML IV SOLN
INTRAVENOUS | Status: AC
  Filled 2020-11-17: qty 100

## 2020-11-17 MED ORDER — EPHEDRINE 5 MG/ML INJ
INTRAVENOUS | Status: AC
  Filled 2020-11-17: qty 20

## 2020-11-17 MED ORDER — DEXMEDETOMIDINE (PRECEDEX) IN NS 20 MCG/5ML (4 MCG/ML) IV SYRINGE
PREFILLED_SYRINGE | INTRAVENOUS | Status: DC | PRN
  Administered 2020-11-17 (×5): 4 ug via INTRAVENOUS

## 2020-11-17 MED ORDER — ROCURONIUM BROMIDE 10 MG/ML (PF) SYRINGE
PREFILLED_SYRINGE | INTRAVENOUS | Status: AC
  Filled 2020-11-17: qty 10

## 2020-11-17 MED ORDER — GLYCOPYRROLATE 0.2 MG/ML IJ SOLN
INTRAMUSCULAR | Status: DC | PRN
  Administered 2020-11-17: .2 mg via INTRAVENOUS

## 2020-11-17 MED ORDER — LOSARTAN POTASSIUM 25 MG PO TABS: 25.0000 mg | ORAL_TABLET | Freq: Every day | ORAL | Status: DC

## 2020-11-17 MED ORDER — LACTATED RINGERS IV SOLN: INTRAVENOUS | Status: DC

## 2020-11-17 MED ORDER — SUCCINYLCHOLINE CHLORIDE 200 MG/10ML IV SOSY
PREFILLED_SYRINGE | INTRAVENOUS | Status: AC
  Filled 2020-11-17: qty 10

## 2020-11-17 MED ORDER — FAMOTIDINE 20 MG PO TABS
40.0000 mg | ORAL_TABLET | Freq: Every day | ORAL | Status: DC
  Administered 2020-11-17: 40 mg via ORAL
  Filled 2020-11-17: qty 2

## 2020-11-17 MED ORDER — ONDANSETRON HCL 4 MG/2ML IJ SOLN
INTRAMUSCULAR | Status: AC
  Filled 2020-11-17: qty 8

## 2020-11-17 MED ORDER — PHENYLEPHRINE HCL (PRESSORS) 10 MG/ML IV SOLN
INTRAVENOUS | Status: AC
  Filled 2020-11-17: qty 1

## 2020-11-17 MED ORDER — SIMVASTATIN 20 MG PO TABS
20.0000 mg | ORAL_TABLET | Freq: Every day | ORAL | Status: DC
  Administered 2020-11-17: 20 mg via ORAL
  Filled 2020-11-17 (×2): qty 1

## 2020-11-17 MED ORDER — SODIUM CHLORIDE 0.9 % IV SOLN
INTRAVENOUS | Status: DC | PRN
  Administered 2020-11-17: 10 ug/min via INTRAVENOUS

## 2020-11-17 MED ORDER — ENOXAPARIN SODIUM 40 MG/0.4ML ~~LOC~~ SOLN
40.0000 mg | SUBCUTANEOUS | Status: DC
  Administered 2020-11-18: 40 mg via SUBCUTANEOUS
  Filled 2020-11-17: qty 0.4

## 2020-11-17 MED ORDER — HYDROCODONE-ACETAMINOPHEN 5-325 MG PO TABS: 1.0000 | ORAL_TABLET | Freq: Four times a day (QID) | ORAL | Status: DC | PRN

## 2020-11-17 MED ORDER — CHLORHEXIDINE GLUCONATE 0.12 % MT SOLN
OROMUCOSAL | Status: AC
  Administered 2020-11-17: 15 mL via OROMUCOSAL
  Filled 2020-11-17: qty 15

## 2020-11-17 MED ORDER — HYDROCHLOROTHIAZIDE 10 MG/ML ORAL SUSPENSION
6.2500 mg | Freq: Every day | ORAL | Status: DC
  Filled 2020-11-17: qty 1.25

## 2020-11-17 SURGICAL SUPPLY — 26 items
ADAPTER IRRIG TUBE 2 SPIKE SOL (ADAPTER) IMPLANT
ADPR TBG 2 SPK PMP STRL ASCP (ADAPTER)
BAG DRAIN CYSTO-URO LG1000N (MISCELLANEOUS) ×2 IMPLANT
BAG DRN LRG CPC RND TRDRP CNTR (MISCELLANEOUS) ×1
BAG URO DRAIN 4000ML (MISCELLANEOUS) ×2 IMPLANT
CATH FOL 2WAY LX 24X30 (CATHETERS) IMPLANT
DRAPE UTILITY 15X26 TOWEL STRL (DRAPES) ×2 IMPLANT
ELECT LOOP 22F BIPOLAR SML (ELECTROSURGICAL)
ELECTRODE LOOP 22F BIPOLAR SML (ELECTROSURGICAL) IMPLANT
GLOVE SURG UNDER POLY LF SZ7.5 (GLOVE) ×2 IMPLANT
GOWN STRL REUS W/ TWL LRG LVL3 (GOWN DISPOSABLE) ×2 IMPLANT
GOWN STRL REUS W/ TWL XL LVL3 (GOWN DISPOSABLE) ×1 IMPLANT
GOWN STRL REUS W/TWL LRG LVL3 (GOWN DISPOSABLE) ×4
GOWN STRL REUS W/TWL XL LVL3 (GOWN DISPOSABLE) ×2
HOLDER FOLEY CATH W/STRAP (MISCELLANEOUS) ×2 IMPLANT
IV NS IRRIG 3000ML ARTHROMATIC (IV SOLUTION) ×20 IMPLANT
KIT TURNOVER CYSTO (KITS) ×2 IMPLANT
LOOP CUT BIPOLAR 24F LRG (ELECTROSURGICAL) ×4 IMPLANT
MANIFOLD NEPTUNE II (INSTRUMENTS) IMPLANT
PACK CYSTO AR (MISCELLANEOUS) ×2 IMPLANT
SET IRRIG Y TYPE TUR BLADDER L (SET/KITS/TRAYS/PACK) ×2 IMPLANT
SET IRRIGATING DISP (SET/KITS/TRAYS/PACK) ×2 IMPLANT
SYR TOOMEY 50ML (SYRINGE) ×2 IMPLANT
SYR TOOMEY IRRIG 70ML (MISCELLANEOUS) ×2
SYRINGE TOOMEY IRRIG 70ML (MISCELLANEOUS) ×1 IMPLANT
WATER STERILE IRR 1000ML POUR (IV SOLUTION) ×2 IMPLANT

## 2020-11-17 NOTE — Anesthesia Preprocedure Evaluation (Signed)
Anesthesia Evaluation  Patient identified by MRN, date of birth, ID band Patient awake    Reviewed: Allergy & Precautions, H&P , NPO status , Patient's Chart, lab work & pertinent test results, reviewed documented beta blocker date and time   Airway Mallampati: I  TM Distance: >3 FB Neck ROM: full    Dental  (+) Dental Advidsory Given, Teeth Intact, Caps   Pulmonary neg shortness of breath, sleep apnea , pneumonia (Covid in January), resolved, neg COPD, neg recent URI,    Pulmonary exam normal breath sounds clear to auscultation       Cardiovascular Exercise Tolerance: Good hypertension, (-) angina(-) Past MI and (-) Cardiac Stents Normal cardiovascular exam(-) dysrhythmias (-) Valvular Problems/Murmurs Rhythm:regular Rate:Normal     Neuro/Psych neg Seizures PSYCHIATRIC DISORDERS Dementia TIA   GI/Hepatic Neg liver ROS, hiatal hernia, GERD  ,  Endo/Other  diabetes, Well Controlled  Renal/GU Renal disease (kidney stones)  negative genitourinary   Musculoskeletal   Abdominal   Peds  Hematology negative hematology ROS (+)   Anesthesia Other Findings Past Medical History: No date: Arthritis No date: Diabetes mellitus without complication (HCC)     Comment:  diet controlled No date: GERD (gastroesophageal reflux disease) No date: History of hiatal hernia No date: History of kidney stones No date: Hypertension No date: Sleep apnea     Comment:  does not uses CPAP machine for many years  No date: Stroke [facility])     Comment:  2018   Reproductive/Obstetrics negative OB ROS                             Anesthesia Physical Anesthesia Plan  ASA: II  Anesthesia Plan: General   Post-op Pain Management:    Induction: Intravenous  PONV Risk Score and Plan: 2 and Ondansetron, Dexamethasone and Treatment may vary due to age or medical condition  Airway Management Planned: LMA  Additional  Equipment:   Intra-op Plan:   Post-operative Plan: Extubation in OR  Informed Consent: I have reviewed the patients History and Physical, chart, labs and discussed the procedure including the risks, benefits and alternatives for the proposed anesthesia with the patient or authorized representative who has indicated his/her understanding and acceptance.     Dental Advisory Given  Plan Discussed with: Anesthesiologist, CRNA and Surgeon  Anesthesia Plan Comments:         Anesthesia Quick Evaluation

## 2020-11-17 NOTE — Interval H&P Note (Signed)
History and Physical Interval Note: CV: RRR Lungs: Clear  11/17/2020 9:32 AM  David Myers  has presented today for surgery, with the diagnosis of bph WITH INCOMPLETE EMPTYING.  The various methods of treatment have been discussed with the patient and family. After consideration of risks, benefits and other options for treatment, the patient has consented to  Procedure(s): TRANSURETHRAL RESECTION OF THE PROSTATE (TURP) (N/A) as a surgical intervention.  The patient's history has been reviewed, patient examined, no change in status, stable for surgery.  I have reviewed the patient's chart and labs.  Questions were answered to the patient's satisfaction.     [place]

## 2020-11-17 NOTE — Transfer of Care (Signed)
Immediate Anesthesia Transfer of Care Note  Patient: David Myers  Procedure(s) Performed: TRANSURETHRAL RESECTION OF THE PROSTATE (TURP) (N/A )  Patient Location: PACU  Anesthesia Type:General  Level of Consciousness: sedated  Airway & Oxygen Therapy: Patient Spontanous Breathing and Patient connected to face mask oxygen  Post-op Assessment: Report given to RN and Post -op Vital signs reviewed and stable  Post vital signs: Reviewed and stable  Last Vitals:  Vitals Value Taken Time  BP    Temp 36.1 C 11/17/20 1124  Pulse 67 11/17/20 1125  Resp 13 11/17/20 1125  SpO2 98 % 11/17/20 1125  Vitals shown include unvalidated device data.  Last Pain:  Vitals:   11/17/20 0836  TempSrc: Temporal         Complications: No complications documented.

## 2020-11-17 NOTE — Op Note (Signed)
Preoperative diagnosis: 1. BPH with incomplete bladder emptying  Postoperative diagnosis:  1. BPH with incomplete bladder emptying  Procedure:  1. Cystoscopy 2. Transurethral resection of the prostate  Surgeon: Nicki Reaper C. Aidenjames Heckmann M.D.  Anesthesia: General  Complications: None  EBL: Minimal  Findings:  Moderate lateral lobe enlargement with moderate bladder neck elevation  Heavily trabeculated bladder with diverticula and cellules  UOs normal-appearing with clear efflux and relatively close to bladder neck  No mucosal abnormalities including solid or papillary tumors  Multiple pockets of yellow purulent material with clusters of calculi measuring up to 5 mm  Small capsular perforation left lateral prostate proximally   Specimens: 1. Prostate chips   Indication: David Myers 87 y.o. male with BPH, bothersome LUTS and incomplete bladder emptying.  He was initially interested in pursuing UroLift however was recently treated for a bout of acute prostatitis and it was felt TURP would be a better option.  Prostate volume by TRUS was 46 cc.  After reviewing the management options for treatment, he elected to proceed with the above surgical procedure(s). We have discussed the potential benefits and risks of the procedure, side effects of the proposed treatment, the likelihood of the patient achieving the goals of the procedure, and any potential problems that might occur during the procedure or recuperation. Informed consent has been obtained.  Description of procedure:  The patient was taken to the operating room and general anesthesia was induced.  The patient was placed in the dorsal lithotomy position, prepped and draped in the usual sterile fashion, and preoperative antibiotics were administered. A preoperative time-out was performed.   A 24 French cystoscope sheath with Timberlake obturator was lubricated and advanced into the bladder without difficulty  An Iglesias  resectoscope with loop was then placed into the sheath and panendoscopy was performed  The ureteral orifices were identified and marked so as to be avoided during the procedure.  The prostate adenoma was then resected utilizing loop cautery resection with the bipolar cutting loop.  The prostate adenoma from the bladder neck back to the verumontanum was resected beginning at the six o'clock position and then extended to include the right and left lobes of the prostate and anterior prostate. Care was taken not to resect distal to the verumontanum.  Hemostasis was then achieved with the cautery and the bladder was emptied and reinspected with no significant bleeding noted at the end of the procedure.    A 24 French 3 way catheter was then placed into the bladder and placed on continuous bladder irrigation with return of clear effluent on moderate flow.  The patient appeared to tolerate the procedure well and without complications.  After anesthetic reversal he was transported to the PACU in satisfactory condition.    Plan:  Continuous bladder irrigation overnight  If no significant hematuria discharge in a.m. with indwelling Foley  Follow-up office 11/23/2020 for catheter removal/voiding trial   [name], MD

## 2020-11-17 NOTE — Anesthesia Procedure Notes (Signed)
Procedure Name: LMA Insertion Date/Time: 11/17/2020 9:46 AM Performed by: Lily Peer, Lakai Moree, CRNA Pre-anesthesia Checklist: Patient identified, Emergency Drugs available, Suction available and Patient being monitored Patient Re-evaluated:Patient Re-evaluated prior to induction Oxygen Delivery Method: Circle system utilized Preoxygenation: Pre-oxygenation with 100% oxygen Induction Type: IV induction Ventilation: Mask ventilation without difficulty LMA: LMA inserted LMA Size: 4.0 Number of attempts: 1 Placement Confirmation: positive ETCO2 and breath sounds checked- equal and bilateral Tube secured with: Tape Dental Injury: Teeth and Oropharynx as per pre-operative assessment

## 2020-11-18 ENCOUNTER — Telehealth: Payer: Self-pay | Admitting: Physician Assistant

## 2020-11-18 ENCOUNTER — Encounter: Payer: Self-pay | Admitting: Urology

## 2020-11-18 DIAGNOSIS — N401 Enlarged prostate with lower urinary tract symptoms: Secondary | ICD-10-CM | POA: Diagnosis not present

## 2020-11-18 DIAGNOSIS — R3914 Feeling of incomplete bladder emptying: Secondary | ICD-10-CM

## 2020-11-18 LAB — HEMOGLOBIN AND HEMATOCRIT, BLOOD
HCT: 33.3 % — ABNORMAL LOW (ref 39.0–52.0)
Hemoglobin: 11.3 g/dL — ABNORMAL LOW (ref 13.0–17.0)

## 2020-11-18 LAB — SURGICAL PATHOLOGY

## 2020-11-18 MED ORDER — DOCUSATE SODIUM 100 MG PO CAPS: 100.0000 mg | ORAL_CAPSULE | Freq: Two times a day (BID) | ORAL | 0 refills | Status: DC

## 2020-11-18 MED ORDER — HYDROCODONE-ACETAMINOPHEN 5-325 MG PO TABS: 1.0000 | ORAL_TABLET | Freq: Four times a day (QID) | ORAL | 0 refills | Status: DC | PRN

## 2020-11-18 MED ORDER — CHLORHEXIDINE GLUCONATE CLOTH 2 % EX PADS
6.0000 | MEDICATED_PAD | Freq: Every day | CUTANEOUS | Status: DC
  Administered 2020-11-18: 6 via TOPICAL

## 2020-11-18 NOTE — Telephone Encounter (Signed)
please schedule him for a 2-part voiding trial with me on Monday the 25th per Sam.  App made   [employer]

## 2020-11-18 NOTE — Discharge Summary (Signed)
Date of admission: 11/17/2020  Date of discharge: 11/18/2020  Admission diagnosis: BPH with incomplete bladder emptying  Discharge diagnosis: Same as above  Secondary diagnoses:  Patient Active Problem List   Diagnosis Date Noted  . S/P TURP (status post transurethral resection of prostate) 11/17/2020  . Vascular dementia without behavioral disturbance ([place]) 04/30/2020  . Chronic bilateral low back pain with bilateral sciatica 03/05/2020  . Lumbar stenosis with neurogenic claudication 03/05/2020  . History of transient ischemic attack (TIA) 06/15/2018  . Numbness and tingling 06/09/2018  . TIA (transient ischemic attack) 06/09/2018  . Diabetes mellitus with peripheral angiopathy ([place]) 04/09/2018  . Hyperlipidemia, mixed 03/28/2017  . Low serum vitamin D 09/26/2016  . Medicare annual wellness visit, initial 09/26/2016  . 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  . B12 deficiency 03/23/2015  . Elevated PSA, less than 10 ng/ml 03/23/2015  . Degenerative lumbar disc 10/08/2014  . Nephrolithiasis 02/10/2014    History and Physical: For full details, please see admission history and physical. Briefly, David Myers is a 87 y.o. year old patient admitted on 11/17/2020 for scheduled TURP with Dr. Bernardo Heater for management of BPH with incomplete bladder emptying.   Hospital Course: Patient tolerated the procedure well.  He was then transferred to the floor after an uneventful PACU stay.  His hospital course was uncomplicated.  On POD#1 he had met discharge criteria: was eating a carb modified diet, was up and ambulating independently,  pain was well controlled, gross hematuria was minimal off CBI, catheter was draining well, and was ready for discharge.  Laboratory values:  Recent Labs    11/17/20 1753 11/18/20 0359  WBC 7.9  --   HGB 13.1 11.3*  HCT 39.1 33.3*   Recent Labs    11/17/20 1753  CREATININE 1.25*   Results for orders placed or performed during the hospital  encounter of 11/17/20  SARS Coronavirus 2 by RT PCR (hospital order, performed in [facility] hospital lab) Nasopharyngeal Nasopharyngeal Swab     Status: None   Collection Time: 11/17/20 11:46 AM   Specimen: Nasopharyngeal Swab  Result Value Ref Range Status   SARS Coronavirus 2 NEGATIVE NEGATIVE Final    Comment: (NOTE) SARS-CoV-2 target nucleic acids are NOT DETECTED.  The SARS-CoV-2 RNA is generally detectable in upper and lower respiratory specimens during the acute phase of infection. The lowest concentration of SARS-CoV-2 viral copies this assay can detect is 250 copies / mL. A negative result does not preclude SARS-CoV-2 infection and should not be used as the sole basis for treatment or other patient management decisions.  A negative result may occur with improper specimen collection / handling, submission of specimen other than nasopharyngeal swab, presence of viral mutation(s) within the areas targeted by this assay, and inadequate number of viral copies (<250 copies / mL). A negative result must be combined with clinical observations, patient history, and epidemiological information.  Fact Sheet for Patients:   StrictlyIdeas.no  Fact Sheet for Healthcare Providers: BankingDealers.co.za  This test is not yet approved or  cleared by the [place] FDA and has been authorized for detection and/or diagnosis of SARS-CoV-2 by FDA under an Emergency Use Authorization (EUA).  This EUA will remain in effect (meaning this test can be used) for the duration of the COVID-19 declaration under Section 564(b)(1) of the Act, 21 U.S.C. section 360bbb-3(b)(1), unless the authorization is terminated or revoked sooner.  Performed at [facility], [address], [place], [place] [address]     Disposition:  Home  Discharge instruction: The patient was instructed to be ambulatory but told to refrain from heavy lifting,  strenuous activity, or driving. He was provided catheter care instructions.  Discharge medications:  Allergies as of 11/18/2020      Reactions   Augmentin [amoxicillin-pot Clavulanate] Diarrhea   Led to C-diff       Medication List    TAKE these medications   aspirin EC 81 MG tablet Take 81 mg by mouth daily.   docusate sodium 100 MG capsule Commonly known as: Colace Take 1 capsule (100 mg total) by mouth 2 (two) times daily.   dorzolamide 2 % ophthalmic solution Commonly known as: TRUSOPT 1 drop 2 (two) times daily.   famotidine 40 MG tablet Commonly known as: PEPCID Take 40 mg by mouth at bedtime.   HYDROcodone-acetaminophen 5-325 MG tablet Commonly known as: NORCO/VICODIN Take 1-2 tablets by mouth every 6 (six) hours as needed for moderate pain.   latanoprost 0.005 % ophthalmic solution Commonly known as: XALATAN Place 1 drop into both eyes at bedtime.   losartan-hydrochlorothiazide 50-12.5 MG tablet Commonly known as: HYZAAR Take 0.5 tablets by mouth daily.   pantoprazole 40 MG tablet Commonly known as: PROTONIX Take 40 mg by mouth daily.   Rocklatan 0.02-0.005 % Soln Generic drug: Netarsudil-Latanoprost Apply 1 drop to eye daily.   simvastatin 20 MG tablet Commonly known as: ZOCOR Take 20 mg by mouth at bedtime.   Vitamin D3 25 MCG (1000 UT) Caps Take 1,000 Units by mouth daily.       Followup:   Follow-up Information    Debroah Loop, [place] On 11/23/2020.   Specialty: Urology Why: for postop voiding trial Contact information: [place] [address] [phone]

## 2020-11-18 NOTE — Care Management Obs Status (Signed)
[place] NOTIFICATION   Patient Details  Name: David Myers MRN: [id number] Date of Birth: Aug 02, 1931   Medicare Observation Status Notification Given:  Yes    Candie Chroman, LCSW 11/18/2020, 3:05 PM

## 2020-11-18 NOTE — Progress Notes (Signed)
Patient and wife given instructions to make follow up appointments, when to return for worsening symptoms, IV taken out without any bleeding, Foley care and CHG wipes education given, patient discharged with leg bag & supplies via wheelchair.

## 2020-11-18 NOTE — Progress Notes (Signed)
Discharged with foley

## 2020-11-18 NOTE — Progress Notes (Signed)
Urology Inpatient Progress Note  Subjective: No acute events overnight. Hemoglobin down today, 11.3. Foley catheter in place draining clear efflux with scant blood product sediment on slow drip CBI. Patient reports feeling well today and desires to go home.  Patient's wife requests both a night and leg bag for home Foley care.  Anti-infectives: Anti-infectives (From admission, onward)   Start     Dose/Rate Route Frequency Ordered Stop   11/17/20 1800  ceFAZolin (ANCEF) 1 g in sodium chloride 0.9 % 100 mL IVPB        1 g 200 mL/hr over 30 Minutes Intravenous Every 8 hours 11/17/20 1744 11/18/20 0220   11/17/20 0822  cefTRIAXone (ROCEPHIN) 1 g in sodium chloride 0.9 % 100 mL IVPB        1 g 200 mL/hr over 30 Minutes Intravenous 30 min pre-op 11/17/20 2671 11/17/20 1009      Current Facility-Administered Medications  Medication Dose Route Frequency Provider Last Rate Last Admin  . acetaminophen (TYLENOL) tablet 650 mg  650 mg Oral Q4H PRN Stoioff, Scott C, MD      . belladonna-opium (B&O) suppository 16.2-60mg   1 suppository Rectal Q6H PRN Stoioff, Scott C, MD      . enoxaparin (LOVENOX) injection 40 mg  40 mg Subcutaneous Q24H Stoioff, Scott C, MD      . famotidine (PEPCID) tablet 40 mg  40 mg Oral QHS Stoioff, Scott C, MD   40 mg at 11/17/20 2133  . hydrochlorothiazide (MICROZIDE) capsule 12.5 mg  12.5 mg Oral Daily Stoioff, Scott C, MD   12.5 mg at 11/17/20 2133   And  . losartan (COZAAR) tablet 25 mg  25 mg Oral Daily Stoioff, Scott C, MD   25 mg at 11/17/20 2133  . HYDROcodone-acetaminophen (NORCO/VICODIN) 5-325 MG per tablet 1 tablet  1 tablet Oral Q6H PRN Stoioff, Scott C, MD      . lactated ringers infusion   Intravenous Continuous Stoioff, Scott C, MD      . Netarsudil-Latanoprost 0.02-0.005 % SOLN 1 drop  1 drop Ophthalmic Daily Stoioff, Scott C, MD      . pantoprazole (PROTONIX) EC tablet 40 mg  40 mg Oral Daily Stoioff, Scott C, MD      . simvastatin (ZOCOR) tablet 20 mg  20  mg Oral QHS Stoioff, Scott C, MD   20 mg at 11/17/20 2134  . sodium chloride irrigation 0.9 % 3,000 mL  3,000 mL Irrigation Continuous Stoioff, Scott C, MD   3,000 mL at 11/18/20 0647   Objective: Vital signs in last 24 hours: Temp:  [96.8 F (36 C)-97.9 F (36.6 C)] 97.8 F (36.6 C) (04/20 0758) Pulse Rate:  [35-94] 55 (04/20 0758) Resp:  [9-20] 20 (04/20 0758) BP: (87-127)/(45-77) 98/57 (04/20 0758) SpO2:  [74 %-100 %] 97 % (04/20 0758)  Intake/Output from previous day: 04/19 0701 - 04/20 0700 In: [place] [P.O.:120; I.V.:600; IV Piggyback:100] Out: 27400 [Urine:27400] Intake/Output this shift: No intake/output data recorded.  Physical Exam Vitals and nursing note reviewed.  Constitutional:      General: He is not in acute distress.    Appearance: He is not ill-appearing, toxic-appearing or diaphoretic.  HENT:     Head: Normocephalic and atraumatic.  Pulmonary:     Effort: Pulmonary effort is normal. No respiratory distress.  Skin:    General: Skin is warm and dry.  Neurological:     Mental Status: He is alert and oriented to person, place, and time.  Psychiatric:  Mood and Affect: Mood normal.        Behavior: Behavior normal.    Lab Results:  Recent Labs    11/17/20 1753 11/18/20 0359  WBC 7.9  --   HGB 13.1 11.3*  HCT 39.1 33.3*  PLT 199  --    BMET Recent Labs    11/17/20 1753  CREATININE 1.25*   Assessment & Plan: 87 year old male POD 1 from TURP with Dr. Bernardo Heater for management of BPH with incomplete bladder emptying.  Patient is recovering well from surgery with no acute concerns today.  Urine is clear on slow drip CBI.  I turned CBI off at the bedside today and explained that unless his urine returns to cherry red or darker, we will plan for discharge later today.  We will arrange to provide him both leg and night bags upon discharge.  I advanced his diet orders as tolerated.  Will plan to discharge with Foley catheter in place and plans for  catheter removal/voiding trial in clinic in 5 days.  Debroah Loop, PA-C 11/18/2020

## 2020-11-18 NOTE — Anesthesia Postprocedure Evaluation (Signed)
Anesthesia Post Note  Patient: David Myers  Procedure(s) Performed: TRANSURETHRAL RESECTION OF THE PROSTATE (TURP) (N/A )  Patient location during evaluation: PACU Anesthesia Type: General Level of consciousness: awake and alert Pain management: pain level controlled Vital Signs Assessment: post-procedure vital signs reviewed and stable Respiratory status: spontaneous breathing, nonlabored ventilation, respiratory function stable and patient connected to nasal cannula oxygen Cardiovascular status: blood pressure returned to baseline and stable Postop Assessment: no apparent nausea or vomiting Anesthetic complications: no   No complications documented.   Last Vitals:  Vitals:   11/18/20 1105 11/18/20 1436  BP: (!) 97/59 98/61  Pulse: (!) 54 66  Resp: 14 18  Temp: (!) 36.4 C (!) 36.3 C  SpO2: 99% 100%    Last Pain:  Vitals:   11/18/20 1436  TempSrc: Oral  PainSc:                  Martha Clan

## 2020-11-23 ENCOUNTER — Encounter: Payer: Self-pay | Admitting: Physician Assistant

## 2020-11-23 ENCOUNTER — Ambulatory Visit: Payer: PPO | Admitting: Physician Assistant

## 2020-11-23 ENCOUNTER — Ambulatory Visit (INDEPENDENT_AMBULATORY_CARE_PROVIDER_SITE_OTHER): Payer: PPO | Admitting: Physician Assistant

## 2020-11-23 ENCOUNTER — Other Ambulatory Visit: Payer: Self-pay

## 2020-11-23 VITALS — BP 143/76 | HR 66 | Ht 68.0 in | Wt 167.0 lb

## 2020-11-23 DIAGNOSIS — R3914 Feeling of incomplete bladder emptying: Secondary | ICD-10-CM | POA: Diagnosis not present

## 2020-11-23 DIAGNOSIS — N401 Enlarged prostate with lower urinary tract symptoms: Secondary | ICD-10-CM | POA: Diagnosis not present

## 2020-11-23 LAB — BLADDER SCAN AMB NON-IMAGING: Scan Result: >247 mL

## 2020-11-23 NOTE — Patient Instructions (Signed)
Congratulations on your recent TURP procedure! As discussed in clinic today, there are three main side effects that commonly occur after surgery: 1. Burning or pain with urination: This typically resolves within 1 week of surgery. If you are still having significant pain with urination 10 days after surgery, please call our clinic. We may need to check you for a urinary tract infection at that point, though this is rare. 2. Blood in the urine: This may come and go, but typically resolves completely within 3 weeks of surgery. If you are on blood thinners, it may take longer for the bleeding to resolve. As long as your urine remains thin and runny and you are not passing large clots (around the size of your palm), this is a normal postoperative finding. If you start to pass dark red urine or thick, ketchup-like urine, please call our office immediately. 3. Urinary leakage or urgency: This tends to improve with time, with most patients becoming dry within around 3 months of surgery. You may wear absorbant underwear or liners for security during this time.   Additionally, to keep your bladder volume low, please start "timed voiding": During the daytime, I'd like you to urinate every 2 hours even if you do not feel the urge to do so. If you are going more often than this, that is fine, but do not go any longer than 2 hours.

## 2020-11-23 NOTE — Progress Notes (Signed)
Afternoon follow-up  Patient returned to clinic this afternoon for repeat PVR. He reports drinking approximately 48oz of fluid. He has been able to urinate. He has had bothersome urgency and urinary leakage. PVR >254mL.  Results for orders placed or performed in visit on 11/23/20  BLADDER SCAN AMB NON-IMAGING  Result Value Ref Range   Scan Result >247 mL    Voiding trial passed. Counseled patient on normal postoperative findings including dysuria, gross hematuria, and urinary urgency/incontinence. Shared negative pathology results. Counseled him to start timed voiding every 2 hours during the day. He expressed understanding.  Follow up: Return in about 6 weeks (around 01/04/2021) for Postop follow up with Dr. Bernardo Heater with PVR/IPSS.

## 2020-11-23 NOTE — Progress Notes (Signed)
Catheter Removal  Patient is present today for a catheter removal.  66ml of water was drained from the balloon. A 24FR three-way foley cath was removed from the bladder no complications were noted . Patient tolerated well.  Performed by: Debroah Loop, PA-C   Follow up/ Additional notes: Push fluids and RTC this afternoon for PVR.

## 2020-11-26 ENCOUNTER — Encounter: Payer: Self-pay | Admitting: Physician Assistant

## 2020-11-26 ENCOUNTER — Ambulatory Visit (INDEPENDENT_AMBULATORY_CARE_PROVIDER_SITE_OTHER): Payer: PPO | Admitting: Physician Assistant

## 2020-11-26 ENCOUNTER — Other Ambulatory Visit: Payer: Self-pay

## 2020-11-26 VITALS — BP 136/71 | HR 64 | Ht 70.0 in | Wt 167.0 lb

## 2020-11-26 DIAGNOSIS — R300 Dysuria: Secondary | ICD-10-CM | POA: Diagnosis not present

## 2020-11-26 LAB — BLADDER SCAN AMB NON-IMAGING: Scan Result: 206

## 2020-11-26 MED ORDER — CEFDINIR 300 MG PO CAPS: 300.0000 mg | ORAL_CAPSULE | Freq: Two times a day (BID) | ORAL | 0 refills | Status: AC

## 2020-11-26 NOTE — Progress Notes (Signed)
11/26/2020 11:36 AM   David Myers November 17, 1931 [id number]  CC: Chief Complaint  Patient presents with  . Dysuria   HPI: David Myers is a 87 y.o. male with PMH incomplete bladder emptying and recent acute prostatitis s/p TURP with Dr. Bernardo Heater 9 days ago who presents today for evaluation of possible UTI.  I saw him in clinic most recently 3 days ago for postop voiding trial, which he passed.  Today he reports a 1-day history of dysuria, bilateral testicular pain, and decreased urinary flow.  He took Tylenol at home, which alleviated his symptoms.  He denies fever, chills, nausea, or vomiting.  In-office UA today positive for trace glucose, 3+ blood, 1+ protein, and 2+ leukocyte esterase; urine microscopy with >30 WBCs/HPF, >30 RBCs/HPF, and few+ bacteria. PVR 22mL.  PMH: Past Medical History:  Diagnosis Date  . Arthritis   . Diabetes mellitus without complication (HCC)    diet controlled  . GERD (gastroesophageal reflux disease)   . History of hiatal hernia   . History of kidney stones   . Hypertension   . Sleep apnea    does not uses CPAP machine for many years   . Stroke [facility])    2018    Surgical History: Past Surgical History:  Procedure Laterality Date  . CATARACT EXTRACTION W/PHACO Left 08/12/2015   Procedure: CATARACT EXTRACTION PHACO AND INTRAOCULAR LENS PLACEMENT (IOC);  Surgeon: Leandrew Koyanagi, MD;  Location: [place];  Service: Ophthalmology;  Laterality: Left;  DIABETIC - diet controlled TORIC  . CATARACT EXTRACTION W/PHACO Right 09/09/2015   Procedure: CATARACT EXTRACTION PHACO AND INTRAOCULAR LENS PLACEMENT (IOC);  Surgeon: Leandrew Koyanagi, MD;  Location: [place];  Service: Ophthalmology;  Laterality: Right;  DIABETIC - diet controlled  . COLONOSCOPY    . EYE SURGERY    . TRANSURETHRAL RESECTION OF PROSTATE N/A 11/17/2020   Procedure: TRANSURETHRAL RESECTION OF THE PROSTATE (TURP);  Surgeon: Abbie Sons, MD;   Location: [facility];  Service: Urology;  Laterality: N/A;  . WRIST SURGERY      Home Medications:  Allergies as of 11/26/2020      Reactions   Augmentin [amoxicillin-pot Clavulanate] Diarrhea   Led to C-diff       Medication List       Accurate as of November 26, 2020 11:36 AM. If you have any questions, ask your nurse or doctor.        aspirin EC 81 MG tablet Take 81 mg by mouth daily.   docusate sodium 100 MG capsule Commonly known as: Colace Take 1 capsule (100 mg total) by mouth 2 (two) times daily.   famotidine 40 MG tablet Commonly known as: PEPCID Take 40 mg by mouth at bedtime.   HYDROcodone-acetaminophen 5-325 MG tablet Commonly known as: NORCO/VICODIN Take 1-2 tablets by mouth every 6 (six) hours as needed for moderate pain.   losartan-hydrochlorothiazide 50-12.5 MG tablet Commonly known as: HYZAAR Take 0.5 tablets by mouth daily.   montelukast 5 MG chewable tablet Commonly known as: SINGULAIR Chew by mouth.   pantoprazole 40 MG tablet Commonly known as: PROTONIX Take 40 mg by mouth daily.   Rocklatan 0.02-0.005 % Soln Generic drug: Netarsudil-Latanoprost Apply 1 drop to eye daily.   simvastatin 20 MG tablet Commonly known as: ZOCOR Take 20 mg by mouth at bedtime.   Vitamin D3 25 MCG (1000 UT) Caps Take 1,000 Units by mouth daily.       Allergies:  Allergies  Allergen Reactions  .  Augmentin [Amoxicillin-Pot Clavulanate] Diarrhea    Led to C-diff     Family History: Family History  Problem Relation Age of Onset  . Hypertension Mother     Social History:   reports that he has never smoked. He has never used smokeless tobacco. He reports that he does not drink alcohol and does not use drugs.  Physical Exam: BP 136/71   Pulse 64   Ht 5\' 10"  (1.778 m)   Wt 167 lb (75.8 kg)   BMI 23.96 kg/m   Constitutional:  Alert and oriented, no acute distress, nontoxic appearing HEENT: [place], AT Cardiovascular: No clubbing, cyanosis, or  edema Respiratory: Normal respiratory effort, no increased work of breathing GU: Palpable, mildly tender bilateral epididymides Skin: No rashes, bruises or suspicious lesions Neurologic: Grossly intact, no focal deficits, moving all 4 extremities Psychiatric: Normal mood and affect  Laboratory Data: Results for orders placed or performed in visit on 11/26/20  CULTURE, URINE COMPREHENSIVE   Specimen: Urine   UR  Result Value Ref Range   Urine Culture, Comprehensive Preliminary report (A)    Organism ID, Bacteria Gram negative rods (A)   Microscopic Examination   Urine  Result Value Ref Range   WBC, UA >30 (A) 0 - 5 /hpf   RBC >30 (A) 0 - 2 /hpf   Epithelial Cells (non renal) 0-10 0 - 10 /hpf   Bacteria, UA Few None seen/Few  Urinalysis, Complete  Result Value Ref Range   Specific Gravity, UA 1.010 1.005 - 1.030   pH, UA 6.5 5.0 - 7.5   Color, UA Orange Yellow   Appearance Ur Cloudy (A) Clear   Leukocytes,UA 2+ (A) Negative   Protein,UA 1+ (A) Negative/Trace   Glucose, UA Trace (A) Negative   Ketones, UA Negative Negative   RBC, UA 3+ (A) Negative   Bilirubin, UA Negative Negative   Urobilinogen, Ur 0.2 0.2 - 1.0 mg/dL   Nitrite, UA Negative Negative   Microscopic Examination See below:   BLADDER SCAN AMB NON-IMAGING  Result Value Ref Range   Scan Result 206    Assessment & Plan:   1. Dysuria Consistent with infection versus postop finding.  With mildly tender epididymides on physical exam today and a history of recent prostatitis, I recommended antibiotic therapy and patient agreed.  PVR at baseline today.  I am deferring fluoroquinolones for possible early epididymitis given fluoroquinolone resistance on his most recent urine culture.  We will treat with empiric Omnicef and send for culture for further evaluation.  Counseled him to restart Flomax and we reviewed return precautions today including fever, chills, nausea, vomiting.  He expressed understanding -  Urinalysis, Complete - BLADDER SCAN AMB NON-IMAGING - CULTURE, URINE COMPREHENSIVE - cefdinir (OMNICEF) 300 MG capsule; Take 1 capsule (300 mg total) by mouth 2 (two) times daily for 10 days.  Dispense: 20 capsule; Refill: 0   Return if symptoms worsen or fail to improve.  Debroah Loop, PA-C  [facility] [address] [place], [place] [address] [phone]

## 2020-11-26 NOTE — Patient Instructions (Signed)
Restart Flomax and start antibiotics.

## 2020-11-27 LAB — MICROSCOPIC EXAMINATION
RBC, Urine: >30 /hpf — AB (ref 0–2)
WBC, UA: >30 /hpf — AB (ref 0–5)

## 2020-11-27 LAB — URINALYSIS, COMPLETE
Bilirubin, UA: NEGATIVE
Ketones, UA: NEGATIVE
Nitrite, UA: NEGATIVE
Specific Gravity, UA: 1.01 (ref 1.005–1.030)
Urobilinogen, Ur: 0.2 mg/dL (ref 0.2–1.0)
pH, UA: 6.5 (ref 5.0–7.5)

## 2020-12-02 ENCOUNTER — Telehealth: Payer: Self-pay | Admitting: Physician Assistant

## 2020-12-02 DIAGNOSIS — R300 Dysuria: Secondary | ICD-10-CM

## 2020-12-02 NOTE — Telephone Encounter (Signed)
Patient's wife called asking about his UA results from last week. She said she knows you gave him an ABX and said if he was on the correct one you would not call but she wants to know what they results are either way.  I called her back to let her know that the results are not back yet per Sam.  She asked if we could call either way.   Sharyn Lull

## 2020-12-03 DIAGNOSIS — H401212 Low-tension glaucoma, right eye, moderate stage: Secondary | ICD-10-CM | POA: Diagnosis not present

## 2020-12-03 DIAGNOSIS — H02135 Senile ectropion of left lower eyelid: Secondary | ICD-10-CM | POA: Diagnosis not present

## 2020-12-03 DIAGNOSIS — H401221 Low-tension glaucoma, left eye, mild stage: Secondary | ICD-10-CM | POA: Diagnosis not present

## 2020-12-03 DIAGNOSIS — Z961 Presence of intraocular lens: Secondary | ICD-10-CM | POA: Diagnosis not present

## 2020-12-03 DIAGNOSIS — H02132 Senile ectropion of right lower eyelid: Secondary | ICD-10-CM | POA: Diagnosis not present

## 2020-12-03 DIAGNOSIS — R7309 Other abnormal glucose: Secondary | ICD-10-CM | POA: Diagnosis not present

## 2020-12-03 LAB — CULTURE, URINE COMPREHENSIVE

## 2020-12-03 MED ORDER — LEVOFLOXACIN 500 MG PO TABS
500.0000 mg | ORAL_TABLET | Freq: Every day | ORAL | 0 refills | Status: AC
Start: 2020-12-03 — End: 2020-12-13

## 2020-12-03 NOTE — Telephone Encounter (Signed)
Notified patient as advised, ABX sent. Patient verbalized understanding.

## 2020-12-03 NOTE — Telephone Encounter (Signed)
-----   Message from Debroah Loop, [place] sent at 12/03/2020 11:56 AM EDT ----- Please switch him to levofloxacin 500mg  daily x10 days. ----- Message ----- From: Lavone Neri Lab Results In Sent: 11/27/2020   4:38 PM EDT To: Debroah Loop, PA-C

## 2020-12-07 NOTE — Progress Notes (Signed)
12/08/2020 2:36 PM   David Myers 09/07/1931 [id number]  Referring provider: Rusty Aus, MD [facility] [place],  [place] [address]  Chief Complaint  Patient presents with  . Dysuria   Urological history: 1. BPH with LU TS -PSA 6.00 in 10/2020 -PVR 157 mL -s/p TURP-10/2020  2. Right scrotal cellulitis -06/2020 -resolved with antibiotics  3. 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 Nephrolithiasis -RUS 09/2020 - 1.9 cm left lower pole stone  HPI: David Myers is a 87 y.o. male who presents today stating he is still symptomatic after antibiotics with his wife, David Myers.    He grew out Stenotrophomonas maltophilia on 11/26/2020 and has been on Levaquin 500 mg QD for 5 days.  He has 5 more tablets.  He states he has felt miserable for the last two weeks.  He was running a low grade over the last two weeks and it peaked yesterday to 66 F with hard chills.  He states his fever broke last night and he feels much better this morning.    His UA > 30 WBC's, 11-30 RBC's and few bacteria.  His PVR 157 mL.    PMH: Past Medical History:  Diagnosis Date  . Arthritis   . Diabetes mellitus without complication (HCC)    diet controlled  . GERD (gastroesophageal reflux disease)   . History of hiatal hernia   . History of kidney stones   . Hypertension   . Sleep apnea    does not uses CPAP machine for many years   . Stroke [facility])    2018    Surgical History: Past Surgical History:  Procedure Laterality Date  . CATARACT EXTRACTION W/PHACO Left 08/12/2015   Procedure: CATARACT EXTRACTION PHACO AND INTRAOCULAR LENS PLACEMENT (IOC);  Surgeon: Leandrew Koyanagi, MD;  Location: [place];  Service: Ophthalmology;  Laterality: Left;  DIABETIC - diet controlled TORIC  . CATARACT EXTRACTION W/PHACO Right 09/09/2015   Procedure: CATARACT EXTRACTION PHACO AND INTRAOCULAR LENS PLACEMENT (IOC);  Surgeon: Leandrew Koyanagi, MD;  Location: [place];  Service: Ophthalmology;  Laterality: Right;  DIABETIC - diet controlled  . COLONOSCOPY    . EYE SURGERY    . TRANSURETHRAL RESECTION OF PROSTATE N/A 11/17/2020   Procedure: TRANSURETHRAL RESECTION OF THE PROSTATE (TURP);  Surgeon: Abbie Sons, MD;  Location: [facility];  Service: Urology;  Laterality: N/A;  . WRIST SURGERY      Home Medications:  Allergies as of 12/08/2020      Reactions   Augmentin [amoxicillin-pot Clavulanate] Diarrhea   Led to C-diff       Medication List       Accurate as of Dec 08, 2020 11:59 PM. If you have any questions, ask your nurse or doctor.        aspirin EC 81 MG tablet Take 81 mg by mouth daily.   docusate sodium 100 MG capsule Commonly known as: Colace Take 1 capsule (100 mg total) by mouth 2 (two) times daily.   famotidine 40 MG tablet Commonly known as: PEPCID Take 40 mg by mouth at bedtime.   HYDROcodone-acetaminophen 5-325 MG tablet Commonly known as: NORCO/VICODIN Take 1-2 tablets by mouth every 6 (six) hours as needed for moderate pain.   levofloxacin 500 MG tablet Commonly known as: LEVAQUIN Take 1 tablet (500 mg total) by mouth daily for 10 days.   losartan-hydrochlorothiazide 50-12.5 MG tablet Commonly known as: HYZAAR Take 0.5 tablets by mouth  daily.   montelukast 5 MG chewable tablet Commonly known as: SINGULAIR Chew by mouth.   pantoprazole 40 MG tablet Commonly known as: PROTONIX Take 40 mg by mouth daily.   Rocklatan 0.02-0.005 % Soln Generic drug: Netarsudil-Latanoprost Apply 1 drop to eye daily.   simvastatin 20 MG tablet Commonly known as: ZOCOR Take 20 mg by mouth at bedtime.   Vitamin D3 25 MCG (1000 UT) Caps Take 1,000 Units by mouth daily.       Allergies:  Allergies  Allergen Reactions  . Augmentin [Amoxicillin-Pot Clavulanate] Diarrhea    Led to C-diff     Family History: Family History  Problem Relation Age of Onset  . Hypertension  Mother     Social History:  reports that he has never smoked. He has never used smokeless tobacco. He reports that he does not drink alcohol and does not use drugs.  ROS: Pertinent ROS in HPI  Physical Exam: BP (!) 135/99   Pulse 88   Temp (!) 97.5 F (36.4 C) (Oral)   Ht 5\' 7"  (1.702 m)   Wt 159 lb (72.1 kg)   BMI 24.90 kg/m  Constitutional:  Well nourished. Alert and oriented, No acute distress. HEENT: [place] AT, mask in place  Trachea midline, no masses. Cardiovascular: No clubbing, cyanosis, or edema. Respiratory: Normal respiratory effort, no increased work of breathing. Neurologic: Grossly intact, no focal deficits, moving all 4 extremities. Psychiatric: Normal mood and affect.  Laboratory Data: Lab Results  Component Value Date   WBC 7.9 11/17/2020   HGB 11.3 (L) 11/18/2020   HCT 33.3 (L) 11/18/2020   MCV 95.6 11/17/2020   PLT 199 11/17/2020    Lab Results  Component Value Date   CREATININE 1.25 (H) 11/17/2020    Hemoglobin A1C 4.2 - 5.6 % 7.7High   Average Blood Glucose (Calc) mg/dL 174   Resulting [place] - LAB   Narrative Performed by [facility] - LAB Normal Range:  4.2 - 5.6%  Increased Risk: 5.7 - 6.4%  Diabetes:    >= 6.5%  Glycemic Control for adults with diabetes: <7%  Specimen Collected: 10/30/20 08:05 Last Resulted: 10/30/20 15:18  Received From: [place]  Result Received: 11/09/20 09:52    Urinalysis Component     Latest Ref Rng & Units 12/08/2020  Specific Gravity, UA     1.005 - 1.030 1.015  pH, UA     5.0 - 7.5 6.0  Color, UA     Yellow Yellow  Appearance Ur     Clear Cloudy (A)  Leukocytes,UA     Negative 1+ (A)  Protein,UA     Negative/Trace 1+ (A)  Glucose, UA     Negative Negative  Ketones, UA     Negative Negative  RBC, UA     Negative 2+ (A)  Bilirubin, UA     Negative Negative  Urobilinogen, Ur     0.2 - 1.0 mg/dL 0.2  Nitrite, UA     Negative Negative   Microscopic Examination      See below:   Component     Latest Ref Rng & Units 12/08/2020  WBC, UA     0 - 5 /hpf >30 (A)  RBC     0 - 2 /hpf 11-30 (A)  Epithelial Cells (non renal)     0 - 10 /hpf 0-10  Bacteria, UA     None seen/Few Few  I have reviewed the labs.  Pertinent Imaging: Results for David, Myers (MRN [id number]) as of 12/08/2020 10:17  Ref. Range 12/08/2020 08:59  Scan Result Unknown 157    Assessment & Plan:    1. rUTI -patient with positive urine culture that was started on an antibiotic that needed to be changed to a culture appropriate antibiotic 5 days ago -continue Levaquin 500 mg -UA still appears grossly infected -urine sent again for culture  -reviewed red flag signs and advised to seek treatment in the ED   2. BPH with LU TS -s/p TURP  Return for keep follow up wtih Dr. Bernardo Heater in June .  These notes generated with voice recognition software. I apologize for typographical errors.  Zara Council, PA-C  [facility] [address]  [place], [place] [address] [phone]

## 2020-12-08 ENCOUNTER — Ambulatory Visit: Payer: PPO | Admitting: Urology

## 2020-12-08 ENCOUNTER — Other Ambulatory Visit: Payer: Self-pay

## 2020-12-08 ENCOUNTER — Encounter: Payer: Self-pay | Admitting: Urology

## 2020-12-08 VITALS — BP 135/99 | HR 88 | Temp 97.5°F | Ht 67.0 in | Wt 159.0 lb

## 2020-12-08 DIAGNOSIS — N138 Other obstructive and reflux uropathy: Secondary | ICD-10-CM

## 2020-12-08 DIAGNOSIS — N390 Urinary tract infection, site not specified: Secondary | ICD-10-CM | POA: Diagnosis not present

## 2020-12-08 DIAGNOSIS — N401 Enlarged prostate with lower urinary tract symptoms: Secondary | ICD-10-CM

## 2020-12-08 LAB — BLADDER SCAN AMB NON-IMAGING: Scan Result: 157

## 2020-12-09 LAB — MICROSCOPIC EXAMINATION: WBC, UA: >30 /hpf — AB (ref 0–5)

## 2020-12-09 LAB — URINALYSIS, COMPLETE
Bilirubin, UA: NEGATIVE
Glucose, UA: NEGATIVE
Ketones, UA: NEGATIVE
Nitrite, UA: NEGATIVE
Specific Gravity, UA: 1.015 (ref 1.005–1.030)
Urobilinogen, Ur: 0.2 mg/dL (ref 0.2–1.0)
pH, UA: 6 (ref 5.0–7.5)

## 2020-12-14 ENCOUNTER — Telehealth: Payer: Self-pay

## 2020-12-14 NOTE — Telephone Encounter (Signed)
Incoming call from pt and his wife they are calling to inquire about the results of the patient's urine culture. Patient is concerned that his urine needs to be checked to ensure his previous infection has resolved. He denies dysuria, n/v, chills, or fever. He states he took his last dose of antibiotics on Saturday. Please advise.   Secondly patient states he had a fall approximately a week ago, he states he fell against the door hurting his back. Pain has remained constant. He is taking several Tylenol to get pain relief especially at night prior to bed. Patient questions if we could order x-ray. Advised pt to seek care with PCP or urgent care as soon as possible. Wife and patient voice understanding.

## 2020-12-15 DIAGNOSIS — F0150 Vascular dementia without behavioral disturbance: Secondary | ICD-10-CM | POA: Diagnosis not present

## 2020-12-15 DIAGNOSIS — M545 Low back pain, unspecified: Secondary | ICD-10-CM | POA: Diagnosis not present

## 2020-12-15 DIAGNOSIS — K5900 Constipation, unspecified: Secondary | ICD-10-CM | POA: Diagnosis not present

## 2020-12-15 DIAGNOSIS — R109 Unspecified abdominal pain: Secondary | ICD-10-CM | POA: Diagnosis not present

## 2020-12-16 ENCOUNTER — Emergency Department: Payer: PPO | Admitting: Anesthesiology

## 2020-12-16 ENCOUNTER — Encounter: Payer: Self-pay | Admitting: Urology

## 2020-12-16 ENCOUNTER — Encounter
Admission: EM | Disposition: A | Discharge status: Home / Self Care | Payer: Self-pay | Source: Home / Self Care | Attending: Internal Medicine

## 2020-12-16 ENCOUNTER — Ambulatory Visit (INDEPENDENT_AMBULATORY_CARE_PROVIDER_SITE_OTHER): Payer: PPO | Admitting: Urology

## 2020-12-16 ENCOUNTER — Other Ambulatory Visit
Admission: RE | Admit: 2020-12-16 | Discharge: 2020-12-16 | Disposition: A | Discharge status: Home / Self Care | Payer: PPO | Source: Home / Self Care | Attending: Urology | Admitting: Urology

## 2020-12-16 ENCOUNTER — Other Ambulatory Visit: Payer: Self-pay

## 2020-12-16 ENCOUNTER — Emergency Department: Payer: PPO

## 2020-12-16 ENCOUNTER — Inpatient Hospital Stay
Admission: EM | Admit: 2020-12-16 | Discharge: 2020-12-30 | DRG: 660 | Disposition: A | Discharge status: Home / Self Care | Payer: PPO | Source: Ambulatory Visit | Attending: Internal Medicine | Admitting: Internal Medicine

## 2020-12-16 ENCOUNTER — Ambulatory Visit
Admission: RE | Admit: 2020-12-16 | Discharge: 2020-12-16 | Disposition: A | Discharge status: Home / Self Care | Payer: PPO | Source: Ambulatory Visit | Attending: Urology | Admitting: Urology

## 2020-12-16 VITALS — BP 107/70 | HR 80 | Temp 97.8°F | Ht 63.0 in | Wt 155.0 lb

## 2020-12-16 DIAGNOSIS — Z881 Allergy status to other antibiotic agents status: Secondary | ICD-10-CM

## 2020-12-16 DIAGNOSIS — E1122 Type 2 diabetes mellitus with diabetic chronic kidney disease: Secondary | ICD-10-CM | POA: Diagnosis not present

## 2020-12-16 DIAGNOSIS — Z8673 Personal history of transient ischemic attack (TIA), and cerebral infarction without residual deficits: Secondary | ICD-10-CM | POA: Diagnosis not present

## 2020-12-16 DIAGNOSIS — N12 Tubulo-interstitial nephritis, not specified as acute or chronic: Secondary | ICD-10-CM | POA: Diagnosis not present

## 2020-12-16 DIAGNOSIS — E1151 Type 2 diabetes mellitus with diabetic peripheral angiopathy without gangrene: Secondary | ICD-10-CM | POA: Diagnosis not present

## 2020-12-16 DIAGNOSIS — I700 Atherosclerosis of aorta: Secondary | ICD-10-CM | POA: Diagnosis not present

## 2020-12-16 DIAGNOSIS — Z87438 Personal history of other diseases of male genital organs: Secondary | ICD-10-CM

## 2020-12-16 DIAGNOSIS — G4730 Sleep apnea, unspecified: Secondary | ICD-10-CM | POA: Diagnosis not present

## 2020-12-16 DIAGNOSIS — D72829 Elevated white blood cell count, unspecified: Secondary | ICD-10-CM | POA: Diagnosis present

## 2020-12-16 DIAGNOSIS — K449 Diaphragmatic hernia without obstruction or gangrene: Secondary | ICD-10-CM | POA: Diagnosis not present

## 2020-12-16 DIAGNOSIS — K219 Gastro-esophageal reflux disease without esophagitis: Secondary | ICD-10-CM | POA: Diagnosis not present

## 2020-12-16 DIAGNOSIS — N136 Pyonephrosis: Secondary | ICD-10-CM | POA: Diagnosis not present

## 2020-12-16 DIAGNOSIS — Z9842 Cataract extraction status, left eye: Secondary | ICD-10-CM

## 2020-12-16 DIAGNOSIS — Z20822 Contact with and (suspected) exposure to covid-19: Secondary | ICD-10-CM | POA: Diagnosis present

## 2020-12-16 DIAGNOSIS — Z7982 Long term (current) use of aspirin: Secondary | ICD-10-CM

## 2020-12-16 DIAGNOSIS — R3129 Other microscopic hematuria: Secondary | ICD-10-CM | POA: Diagnosis not present

## 2020-12-16 DIAGNOSIS — Z1623 Resistance to quinolones and fluoroquinolones: Secondary | ICD-10-CM | POA: Diagnosis present

## 2020-12-16 DIAGNOSIS — B9689 Other specified bacterial agents as the cause of diseases classified elsewhere: Secondary | ICD-10-CM | POA: Diagnosis present

## 2020-12-16 DIAGNOSIS — I129 Hypertensive chronic kidney disease with stage 1 through stage 4 chronic kidney disease, or unspecified chronic kidney disease: Secondary | ICD-10-CM | POA: Diagnosis not present

## 2020-12-16 DIAGNOSIS — Z87442 Personal history of urinary calculi: Secondary | ICD-10-CM | POA: Diagnosis not present

## 2020-12-16 DIAGNOSIS — Z961 Presence of intraocular lens: Secondary | ICD-10-CM | POA: Diagnosis present

## 2020-12-16 DIAGNOSIS — Z9841 Cataract extraction status, right eye: Secondary | ICD-10-CM

## 2020-12-16 DIAGNOSIS — N3001 Acute cystitis with hematuria: Secondary | ICD-10-CM | POA: Diagnosis not present

## 2020-12-16 DIAGNOSIS — Z9079 Acquired absence of other genital organ(s): Secondary | ICD-10-CM | POA: Diagnosis not present

## 2020-12-16 DIAGNOSIS — K5730 Diverticulosis of large intestine without perforation or abscess without bleeding: Secondary | ICD-10-CM | POA: Diagnosis present

## 2020-12-16 DIAGNOSIS — I10 Essential (primary) hypertension: Secondary | ICD-10-CM | POA: Diagnosis not present

## 2020-12-16 DIAGNOSIS — N390 Urinary tract infection, site not specified: Secondary | ICD-10-CM

## 2020-12-16 DIAGNOSIS — N201 Calculus of ureter: Secondary | ICD-10-CM | POA: Diagnosis not present

## 2020-12-16 DIAGNOSIS — N179 Acute kidney failure, unspecified: Secondary | ICD-10-CM | POA: Diagnosis not present

## 2020-12-16 DIAGNOSIS — N184 Chronic kidney disease, stage 4 (severe): Secondary | ICD-10-CM | POA: Diagnosis present

## 2020-12-16 DIAGNOSIS — N134 Hydroureter: Secondary | ICD-10-CM | POA: Diagnosis not present

## 2020-12-16 DIAGNOSIS — G4733 Obstructive sleep apnea (adult) (pediatric): Secondary | ICD-10-CM | POA: Diagnosis not present

## 2020-12-16 DIAGNOSIS — Z79899 Other long term (current) drug therapy: Secondary | ICD-10-CM

## 2020-12-16 DIAGNOSIS — Z8249 Family history of ischemic heart disease and other diseases of the circulatory system: Secondary | ICD-10-CM

## 2020-12-16 DIAGNOSIS — Z9119 Patient's noncompliance with other medical treatment and regimen: Secondary | ICD-10-CM | POA: Diagnosis not present

## 2020-12-16 DIAGNOSIS — D631 Anemia in chronic kidney disease: Secondary | ICD-10-CM | POA: Diagnosis present

## 2020-12-16 DIAGNOSIS — N401 Enlarged prostate with lower urinary tract symptoms: Secondary | ICD-10-CM | POA: Diagnosis present

## 2020-12-16 DIAGNOSIS — N189 Chronic kidney disease, unspecified: Secondary | ICD-10-CM

## 2020-12-16 DIAGNOSIS — Z87440 Personal history of urinary (tract) infections: Secondary | ICD-10-CM

## 2020-12-16 DIAGNOSIS — N1330 Unspecified hydronephrosis: Secondary | ICD-10-CM | POA: Diagnosis not present

## 2020-12-16 DIAGNOSIS — N320 Bladder-neck obstruction: Secondary | ICD-10-CM | POA: Diagnosis present

## 2020-12-16 DIAGNOSIS — Z1624 Resistance to multiple antibiotics: Secondary | ICD-10-CM | POA: Diagnosis not present

## 2020-12-16 DIAGNOSIS — N200 Calculus of kidney: Secondary | ICD-10-CM

## 2020-12-16 DIAGNOSIS — N202 Calculus of kidney with calculus of ureter: Secondary | ICD-10-CM | POA: Diagnosis present

## 2020-12-16 DIAGNOSIS — I959 Hypotension, unspecified: Secondary | ICD-10-CM | POA: Diagnosis not present

## 2020-12-16 HISTORY — PX: CYSTOSCOPY WITH STENT PLACEMENT: SHX5790

## 2020-12-16 HISTORY — PX: URETEROSCOPY: SHX842

## 2020-12-16 LAB — URINALYSIS, COMPLETE (UACMP) WITH MICROSCOPIC
Bacteria, UA: NONE SEEN
Bilirubin Urine: NEGATIVE
Glucose, UA: NEGATIVE mg/dL
Ketones, ur: NEGATIVE mg/dL
Nitrite: NEGATIVE
Protein, ur: NEGATIVE mg/dL
Specific Gravity, Urine: 1.01 (ref 1.005–1.030)
Squamous Epithelial / HPF: NONE SEEN (ref 0–5)
pH: 6 (ref 5.0–8.0)

## 2020-12-16 LAB — BLADDER SCAN AMB NON-IMAGING: Scan Result: 166

## 2020-12-16 LAB — BASIC METABOLIC PANEL
Anion gap: 11 (ref 5–15)
Anion gap: 13 (ref 5–15)
BUN: 33 mg/dL — ABNORMAL HIGH (ref 8–23)
BUN: 32 mg/dL — ABNORMAL HIGH (ref 8–23)
CO2: 25 mmol/L (ref 22–32)
CO2: 23 mmol/L (ref 22–32)
Calcium: 8.7 mg/dL — ABNORMAL LOW (ref 8.9–10.3)
Calcium: 9.3 mg/dL (ref 8.9–10.3)
Chloride: 94 mmol/L — ABNORMAL LOW (ref 98–111)
Chloride: 95 mmol/L — ABNORMAL LOW (ref 98–111)
Creatinine, Ser: 2.71 mg/dL — ABNORMAL HIGH (ref 0.61–1.24)
Creatinine, Ser: 2.58 mg/dL — ABNORMAL HIGH (ref 0.61–1.24)
GFR, Estimated: 22 mL/min — ABNORMAL LOW (ref >60)
GFR, Estimated: 23 mL/min — ABNORMAL LOW (ref >60)
Glucose, Bld: 153 mg/dL — ABNORMAL HIGH (ref 70–99)
Glucose, Bld: 143 mg/dL — ABNORMAL HIGH (ref 70–99)
Potassium: 4.6 mmol/L (ref 3.5–5.1)
Potassium: 4.6 mmol/L (ref 3.5–5.1)
Sodium: 130 mmol/L — ABNORMAL LOW (ref 135–145)
Sodium: 131 mmol/L — ABNORMAL LOW (ref 135–145)

## 2020-12-16 LAB — GLUCOSE, CAPILLARY: Glucose-Capillary: 158 mg/dL — ABNORMAL HIGH (ref 70–99)

## 2020-12-16 LAB — CBC
HCT: 36.1 % — ABNORMAL LOW (ref 39.0–52.0)
Hemoglobin: 12.6 g/dL — ABNORMAL LOW (ref 13.0–17.0)
MCH: 31.8 pg (ref 26.0–34.0)
MCHC: 34.9 g/dL (ref 30.0–36.0)
MCV: 91.2 fL (ref 80.0–100.0)
Platelets: 308 10*3/uL (ref 150–400)
RBC: 3.96 MIL/uL — ABNORMAL LOW (ref 4.22–5.81)
RDW: 12.7 % (ref 11.5–15.5)
WBC: 11.6 10*3/uL — ABNORMAL HIGH (ref 4.0–10.5)
nRBC: 0 % (ref 0.0–0.2)

## 2020-12-16 LAB — CBC WITH DIFFERENTIAL/PLATELET
Abs Immature Granulocytes: 0.04 10*3/uL (ref 0.00–0.07)
Basophils Absolute: 0 10*3/uL (ref 0.0–0.1)
Basophils Relative: 0 %
Eosinophils Absolute: 0.1 10*3/uL (ref 0.0–0.5)
Eosinophils Relative: 0 %
HCT: 33.6 % — ABNORMAL LOW (ref 39.0–52.0)
Hemoglobin: 11.5 g/dL — ABNORMAL LOW (ref 13.0–17.0)
Immature Granulocytes: 0 %
Lymphocytes Relative: 7 %
Lymphs Abs: 0.9 10*3/uL (ref 0.7–4.0)
MCH: 31.6 pg (ref 26.0–34.0)
MCHC: 34.2 g/dL (ref 30.0–36.0)
MCV: 92.3 fL (ref 80.0–100.0)
Monocytes Absolute: 0.8 10*3/uL (ref 0.1–1.0)
Monocytes Relative: 7 %
Neutro Abs: 10.1 10*3/uL — ABNORMAL HIGH (ref 1.7–7.7)
Neutrophils Relative %: 86 %
Platelets: 284 10*3/uL (ref 150–400)
RBC: 3.64 MIL/uL — ABNORMAL LOW (ref 4.22–5.81)
RDW: 12.7 % (ref 11.5–15.5)
WBC: 11.9 10*3/uL — ABNORMAL HIGH (ref 4.0–10.5)
nRBC: 0 % (ref 0.0–0.2)

## 2020-12-16 LAB — RESP PANEL BY RT-PCR (FLU A&B, COVID) ARPGX2
Influenza A by PCR: NEGATIVE
Influenza B by PCR: NEGATIVE
SARS Coronavirus 2 by RT PCR: NEGATIVE

## 2020-12-16 LAB — LACTIC ACID, PLASMA
Lactic Acid, Venous: 1.2 mmol/L (ref 0.5–1.9)
Lactic Acid, Venous: 1.5 mmol/L (ref 0.5–1.9)

## 2020-12-16 SURGERY — CYSTOSCOPY, WITH STENT INSERTION
Anesthesia: General | Laterality: Left

## 2020-12-16 MED ORDER — TAMSULOSIN HCL 0.4 MG PO CAPS
0.4000 mg | ORAL_CAPSULE | Freq: Every day | ORAL | Status: DC
  Administered 2020-12-16 – 2020-12-30 (×14): 0.4 mg via ORAL
  Filled 2020-12-16 (×14): qty 1

## 2020-12-16 MED ORDER — LEVOFLOXACIN IN D5W 750 MG/150ML IV SOLN
750.0000 mg | Freq: Once | INTRAVENOUS | Status: AC
  Administered 2020-12-16: 750 mg via INTRAVENOUS
  Filled 2020-12-16: qty 150

## 2020-12-16 MED ORDER — INSULIN ASPART 100 UNIT/ML IJ SOLN
0.0000 [IU] | Freq: Three times a day (TID) | INTRAMUSCULAR | Status: DC
  Administered 2020-12-17: 2 [IU] via SUBCUTANEOUS
  Administered 2020-12-17 – 2020-12-26 (×8): 1 [IU] via SUBCUTANEOUS
  Filled 2020-12-16 (×9): qty 1

## 2020-12-16 MED ORDER — DOCUSATE SODIUM 100 MG PO CAPS
100.0000 mg | ORAL_CAPSULE | Freq: Two times a day (BID) | ORAL | Status: DC
  Administered 2020-12-16 – 2020-12-30 (×27): 100 mg via ORAL
  Filled 2020-12-16 (×27): qty 1

## 2020-12-16 MED ORDER — BISACODYL 5 MG PO TBEC
5.0000 mg | DELAYED_RELEASE_TABLET | Freq: Every day | ORAL | Status: DC | PRN
  Administered 2020-12-18: 5 mg via ORAL
  Filled 2020-12-16: qty 1

## 2020-12-16 MED ORDER — ACETAMINOPHEN 650 MG RE SUPP: 650.0000 mg | Freq: Four times a day (QID) | RECTAL | Status: DC | PRN

## 2020-12-16 MED ORDER — SODIUM CHLORIDE 0.9 % IV SOLN
1.0000 g | Freq: Once | INTRAVENOUS | Status: AC
  Administered 2020-12-16: 1 g via INTRAVENOUS
  Filled 2020-12-16: qty 10

## 2020-12-16 MED ORDER — FENTANYL CITRATE (PF) 100 MCG/2ML IJ SOLN
INTRAMUSCULAR | Status: DC | PRN
  Administered 2020-12-16 (×2): 50 ug via INTRAVENOUS

## 2020-12-16 MED ORDER — VITAMIN D 25 MCG (1000 UNIT) PO TABS
1000.0000 [IU] | ORAL_TABLET | Freq: Every day | ORAL | Status: DC
  Administered 2020-12-17 – 2020-12-30 (×13): 1000 [IU] via ORAL
  Filled 2020-12-16 (×13): qty 1

## 2020-12-16 MED ORDER — LEVOFLOXACIN IN D5W 500 MG/100ML IV SOLN
500.0000 mg | INTRAVENOUS | Status: DC
  Administered 2020-12-18: 500 mg via INTRAVENOUS
  Filled 2020-12-16: qty 100

## 2020-12-16 MED ORDER — PANTOPRAZOLE SODIUM 40 MG PO TBEC
40.0000 mg | DELAYED_RELEASE_TABLET | Freq: Every day | ORAL | Status: DC
  Administered 2020-12-17 – 2020-12-30 (×14): 40 mg via ORAL
  Filled 2020-12-16 (×14): qty 1

## 2020-12-16 MED ORDER — ACETAMINOPHEN 325 MG PO TABS: 650.0000 mg | ORAL_TABLET | Freq: Four times a day (QID) | ORAL | Status: DC | PRN

## 2020-12-16 MED ORDER — NETARSUDIL-LATANOPROST 0.02-0.005 % OP SOLN
1.0000 [drp] | Freq: Every day | OPHTHALMIC | Status: DC
  Administered 2020-12-20 – 2020-12-29 (×10): 1 [drp] via OPHTHALMIC
  Filled 2020-12-16 (×11): qty 2.5

## 2020-12-16 MED ORDER — DEXAMETHASONE SODIUM PHOSPHATE 10 MG/ML IJ SOLN
INTRAMUSCULAR | Status: DC | PRN
  Administered 2020-12-16: 6 mg via INTRAVENOUS

## 2020-12-16 MED ORDER — HEPARIN SODIUM (PORCINE) 5000 UNIT/ML IJ SOLN
5000.0000 [IU] | Freq: Three times a day (TID) | INTRAMUSCULAR | Status: DC
  Administered 2020-12-17 – 2020-12-30 (×38): 5000 [IU] via SUBCUTANEOUS
  Filled 2020-12-16 (×38): qty 1

## 2020-12-16 MED ORDER — EPHEDRINE SULFATE 50 MG/ML IJ SOLN
INTRAMUSCULAR | Status: DC | PRN
  Administered 2020-12-16: 10 mg via INTRAVENOUS
  Administered 2020-12-16: 15 mg via INTRAVENOUS

## 2020-12-16 MED ORDER — ONDANSETRON HCL 4 MG PO TABS: 4.0000 mg | ORAL_TABLET | Freq: Four times a day (QID) | ORAL | Status: DC | PRN

## 2020-12-16 MED ORDER — IOPAMIDOL (ISOVUE-200) INJECTION 41%
INTRAVENOUS | Status: DC | PRN
  Administered 2020-12-16: 5 mL via INTRAVENOUS

## 2020-12-16 MED ORDER — ACETAMINOPHEN 10 MG/ML IV SOLN
INTRAVENOUS | Status: DC | PRN
  Administered 2020-12-16: 1000 mg via INTRAVENOUS

## 2020-12-16 MED ORDER — LOSARTAN POTASSIUM-HCTZ 50-12.5 MG PO TABS: 0.5000 | ORAL_TABLET | Freq: Every day | ORAL | Status: DC

## 2020-12-16 MED ORDER — LIDOCAINE HCL (CARDIAC) PF 100 MG/5ML IV SOSY
PREFILLED_SYRINGE | INTRAVENOUS | Status: DC | PRN
  Administered 2020-12-16: 50 mg via INTRAVENOUS

## 2020-12-16 MED ORDER — SODIUM CHLORIDE 0.9 % IV SOLN: 2.0000 g | INTRAVENOUS | Status: DC

## 2020-12-16 MED ORDER — FENTANYL CITRATE (PF) 100 MCG/2ML IJ SOLN
INTRAMUSCULAR | Status: AC
  Filled 2020-12-16: qty 2

## 2020-12-16 MED ORDER — LACTATED RINGERS IV BOLUS
1000.0000 mL | Freq: Once | INTRAVENOUS | Status: AC
  Administered 2020-12-16: 600 mL via INTRAVENOUS

## 2020-12-16 MED ORDER — SODIUM CHLORIDE 0.9 % IV SOLN: INTRAVENOUS | Status: DC

## 2020-12-16 MED ORDER — SIMVASTATIN 20 MG PO TABS
20.0000 mg | ORAL_TABLET | Freq: Every day | ORAL | Status: DC
  Administered 2020-12-16 – 2020-12-29 (×14): 20 mg via ORAL
  Filled 2020-12-16 (×14): qty 1

## 2020-12-16 MED ORDER — ACETAMINOPHEN 10 MG/ML IV SOLN
INTRAVENOUS | Status: AC
  Filled 2020-12-16: qty 100

## 2020-12-16 MED ORDER — ONDANSETRON HCL 4 MG/2ML IJ SOLN: 4.0000 mg | Freq: Four times a day (QID) | INTRAMUSCULAR | Status: DC | PRN

## 2020-12-16 MED ORDER — LIDOCAINE HCL (PF) 2 % IJ SOLN
INTRAMUSCULAR | Status: AC
  Filled 2020-12-16: qty 5

## 2020-12-16 MED ORDER — LEVOFLOXACIN IN D5W 500 MG/100ML IV SOLN: 500.0000 mg | INTRAVENOUS | Status: DC

## 2020-12-16 MED ORDER — LEVOFLOXACIN 500 MG PO TABS: 500.0000 mg | ORAL_TABLET | Freq: Every day | ORAL | 0 refills | Status: DC

## 2020-12-16 MED ORDER — MONTELUKAST SODIUM 4 MG PO CHEW: 4.0000 mg | CHEWABLE_TABLET | Freq: Every day | ORAL | Status: DC

## 2020-12-16 MED ORDER — SUCCINYLCHOLINE CHLORIDE 20 MG/ML IJ SOLN
INTRAMUSCULAR | Status: DC | PRN
  Administered 2020-12-16: 80 mg via INTRAVENOUS

## 2020-12-16 MED ORDER — DEXAMETHASONE SODIUM PHOSPHATE 10 MG/ML IJ SOLN
INTRAMUSCULAR | Status: AC
  Filled 2020-12-16: qty 1

## 2020-12-16 MED ORDER — ONDANSETRON HCL 4 MG/2ML IJ SOLN
INTRAMUSCULAR | Status: DC | PRN
  Administered 2020-12-16: 4 mg via INTRAVENOUS

## 2020-12-16 MED ORDER — ASPIRIN EC 81 MG PO TBEC
81.0000 mg | DELAYED_RELEASE_TABLET | Freq: Every day | ORAL | Status: DC
  Administered 2020-12-17 – 2020-12-30 (×13): 81 mg via ORAL
  Filled 2020-12-16 (×13): qty 1

## 2020-12-16 MED ORDER — FAMOTIDINE 20 MG PO TABS
40.0000 mg | ORAL_TABLET | Freq: Every day | ORAL | Status: DC
  Administered 2020-12-16: 40 mg via ORAL
  Filled 2020-12-16: qty 2

## 2020-12-16 MED ORDER — PROPOFOL 10 MG/ML IV BOLUS
INTRAVENOUS | Status: DC | PRN
  Administered 2020-12-16: 100 mg via INTRAVENOUS
  Administered 2020-12-16: 50 mg via INTRAVENOUS

## 2020-12-16 SURGICAL SUPPLY — 19 items
BAG DRAIN CYSTO-URO LG1000N (MISCELLANEOUS) ×2 IMPLANT
BAG DRN LRG CPC RND TRDRP CNTR (MISCELLANEOUS) ×1
BAG URO DRAIN 4000ML (MISCELLANEOUS) ×2 IMPLANT
BRUSH SCRUB EZ 1% IODOPHOR (MISCELLANEOUS) ×2 IMPLANT
CATH FOL 2WAY LX 16X30 (CATHETERS) ×2 IMPLANT
CATH URETL 5X70 OPEN END (CATHETERS) ×2 IMPLANT
GLIDEWIRE STR 0.035 150CM 3CM (WIRE) ×2 IMPLANT
GLOVE SURG UNDER POLY LF SZ7.5 (GLOVE) ×2 IMPLANT
GOWN STRL REUS W/ TWL XL LVL3 (GOWN DISPOSABLE) ×1 IMPLANT
GOWN STRL REUS W/TWL XL LVL3 (GOWN DISPOSABLE) ×2
GUIDEWIRE STR DUAL SENSOR (WIRE) ×2 IMPLANT
IV NS IRRIG 3000ML ARTHROMATIC (IV SOLUTION) ×2 IMPLANT
KIT TURNOVER CYSTO (KITS) ×2 IMPLANT
PACK CYSTO AR (MISCELLANEOUS) ×2 IMPLANT
SET CYSTO W/LG BORE CLAMP LF (SET/KITS/TRAYS/PACK) ×2 IMPLANT
STENT URET 6FRX24 CONTOUR (STENTS) IMPLANT
STENT URET 6FRX26 CONTOUR (STENTS) IMPLANT
SURGILUBE 2OZ TUBE FLIPTOP (MISCELLANEOUS) ×2 IMPLANT
WATER STERILE IRR 1000ML POUR (IV SOLUTION) ×2 IMPLANT

## 2020-12-16 NOTE — Progress Notes (Signed)
12/16/2020 3:08 PM   David Myers Dec 26, 1931 [id number]  Referring provider: Rusty Aus, MD [facility] [place],  [place] [address]  Chief Complaint  Patient presents with  . Other   Urological history: 1. BPH with LU TS -PSA 6.00 in 10/2020 -PVR 157 mL -s/p TURP-10/2020  2. Right scrotal cellulitis -06/2020 -resolved with antibiotics  3. 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 Nephrolithiasis -RUS 09/2020 - 1.9 cm left lower pole stone  HPI: David Myers is a 87 y.o. male who presents today stating he is still symptomatic after antibiotics with his wife, [name].    His wife states he finished the Levaquin on Saturday.  He started to spike low grade fevers and having chills.  Temperature has gotten to 100.7.  She has been controlling it with Tylenol.  He is also experiencing urgency, frequency and lower abdominal pain.    He has had no appetite.   His UA today is positive for 11-30 WBC's and > 30 RBC's.    His PVR is 166 mL.     PMH: Past Medical History:  Diagnosis Date  . Arthritis   . Diabetes mellitus without complication (HCC)    diet controlled  . GERD (gastroesophageal reflux disease)   . History of hiatal hernia   . History of kidney stones   . Hypertension   . Sleep apnea    does not uses CPAP machine for many years   . Stroke [facility])    2018    Surgical History: Past Surgical History:  Procedure Laterality Date  . CATARACT EXTRACTION W/PHACO Left 08/12/2015   Procedure: CATARACT EXTRACTION PHACO AND INTRAOCULAR LENS PLACEMENT (IOC);  Surgeon: Leandrew Koyanagi, MD;  Location: [place];  Service: Ophthalmology;  Laterality: Left;  DIABETIC - diet controlled TORIC  . CATARACT EXTRACTION W/PHACO Right 09/09/2015   Procedure: CATARACT EXTRACTION PHACO AND INTRAOCULAR LENS PLACEMENT (IOC);  Surgeon: Leandrew Koyanagi, MD;  Location: [place];  Service:  Ophthalmology;  Laterality: Right;  DIABETIC - diet controlled  . COLONOSCOPY    . EYE SURGERY    . TRANSURETHRAL RESECTION OF PROSTATE N/A 11/17/2020   Procedure: TRANSURETHRAL RESECTION OF THE PROSTATE (TURP);  Surgeon: Abbie Sons, MD;  Location: [facility];  Service: Urology;  Laterality: N/A;  . WRIST SURGERY      Home Medications:  Allergies as of 12/16/2020      Reactions   Augmentin [amoxicillin-pot Clavulanate] Diarrhea   Led to C-diff       Medication List       Accurate as of Dec 16, 2020  3:08 PM. If you have any questions, ask your nurse or doctor.        aspirin EC 81 MG tablet Take 81 mg by mouth daily.   docusate sodium 100 MG capsule Commonly known as: Colace Take 1 capsule (100 mg total) by mouth 2 (two) times daily.   famotidine 40 MG tablet Commonly known as: PEPCID Take 40 mg by mouth at bedtime.   HYDROcodone-acetaminophen 5-325 MG tablet Commonly known as: NORCO/VICODIN Take 1-2 tablets by mouth every 6 (six) hours as needed for moderate pain.   losartan-hydrochlorothiazide 50-12.5 MG tablet Commonly known as: HYZAAR Take 0.5 tablets by mouth daily.   montelukast 5 MG chewable tablet Commonly known as: SINGULAIR Chew by mouth.   pantoprazole 40 MG tablet Commonly known as: PROTONIX Take 40 mg by mouth daily.   Rocklatan 0.02-0.005 %  [place] Generic drug: Netarsudil-Latanoprost Apply 1 drop to eye daily.   simvastatin 20 MG tablet Commonly known as: ZOCOR Take 20 mg by mouth at bedtime.   Vitamin D3 25 MCG (1000 UT) Caps Take 1,000 Units by mouth daily.       Allergies:  Allergies  Allergen Reactions  . Augmentin [Amoxicillin-Pot Clavulanate] Diarrhea    Led to C-diff     Family History: Family History  Problem Relation Age of Onset  . Hypertension Mother     Social History:  reports that he has never smoked. He has never used smokeless tobacco. He reports that he does not drink alcohol and does not use  drugs.  ROS: Pertinent ROS in HPI  Physical Exam: BP 107/70   Pulse 80   Temp 97.8 F (36.6 C) (Oral)   Ht 5\' 3"  (1.6 m)   Wt 155 lb (70.3 kg)   BMI 27.46 kg/m  Constitutional:  Well nourished. Alert and oriented, No acute distress. HEENT: [place] AT, mask in place.  Trachea midline Cardiovascular: No clubbing, cyanosis, or edema. Respiratory: Normal respiratory effort, no increased work of breathing. GU: No CVA tenderness.  No bladder fullness or masses.  Patient with normal phallus.  Urethral meatus is patent.  No penile discharge. No penile lesions or rashes. Scrotum without lesions, cysts, rashes and/or edema.  Testicles are located scrotally bilaterally. No masses are appreciated in the testicles. Left and right epididymis are normal. Rectal: Patient with  normal sphincter tone. Anus and perineum without scarring or rashes. No rectal masses are appreciated. Prostate is approximately 45 grams, TURP defects, no nodules are appreciated. Seminal vesicles could not be palpated Lymph: No inguinal adenopathy. Neurologic: Grossly intact, no focal deficits, moving all 4 extremities. Psychiatric: Normal mood and affect.   Laboratory Data: Lab Results  Component Value Date   WBC 7.9 11/17/2020   HGB 11.3 (L) 11/18/2020   HCT 33.3 (L) 11/18/2020   MCV 95.6 11/17/2020   PLT 199 11/17/2020    Lab Results  Component Value Date   CREATININE 1.25 (H) 11/17/2020    Hemoglobin A1C 4.2 - 5.6 % 7.7High   Average Blood Glucose (Calc) mg/dL 174   Resulting [place] - LAB   Narrative Performed by [facility] - LAB Normal Range:  4.2 - 5.6%  Increased Risk: 5.7 - 6.4%  Diabetes:    >= 6.5%  Glycemic Control for adults with diabetes: <7%  Specimen Collected: 10/30/20 08:05 Last Resulted: 10/30/20 15:18  Received From: [place]  Result Received: 11/09/20 09:52    Urinalysis UA 11-30 WBC's and > 30 RBC's I have reviewed the  labs.   Pertinent Imaging: Results for LIEV, BROCKBANK (MRN [id number]) as of 12/16/2020 15:02  Ref. Range 12/16/2020 14:35  Scan Result Unknown 166 ml     Assessment & Plan:    1. rUTI's -UA is still grossly infected -urine culture still pending at the time of this visit -STAT CT identified left hydroureteronephrosis due to 2 adjacent calculi in the distal left ureter measuring 7 mm and 4 mm respectively.  He also has a large lower pole stone on the left. -STAT BMP demonstrates an AKI with creatinine up to 2.71 from 4 weeks ago -patient is brought to the ED for admission, septic work up and preparation for ureteral stent placement -I explained to the patient, his wife and his daughter, Lelon Frohlich, the findings on the CT scan and that he will need an  urgent stent placement this evening.  I advised them that only the stent will be placed during this admission as he has infected urine behind the stones that needs to be drained and that he will need to be on 2 weeks of antibiotics prior to addressing the actual stones.  I explained that the stent will be in place for the 2 weeks and most individuals will experience stent discomfort and I described the symptoms of urgency, dysuria, blood in the urine and flank pain that can be associated with the stent.  I also explained that he will likely have a Foley catheter placed overnight for maximum drainage of the infected urine.  He and his family are in agreement to the admission and are wanting to proceed.  He states the last time he had any solid food was at 10 this morning and it was some Lance crackers.  He drank half a bottle of water on his way to his appointment today at 2 PM.   2. BPH with LU TS -s/p TURP  Return for pending CT scan results .  These notes generated with voice recognition software. I apologize for typographical errors.  Zara Council, PA-C  [facility] [address]  [place], [place]  [address] [phone]

## 2020-12-16 NOTE — Transfer of Care (Signed)
Immediate Anesthesia Transfer of Care Note  Patient: David Myers  Procedure(s) Performed: CYSTOSCOPY WITH STENT PLACEMENT (Left ) URETEROSCOPY (Left )  Patient Location: PACU  Anesthesia Type:General  Level of Consciousness: drowsy and patient cooperative  Airway & Oxygen Therapy: Patient Spontanous Breathing and Patient connected to nasal cannula oxygen  Post-op Assessment: Report given to RN and Post -op Vital signs reviewed and stable  Post vital signs: Reviewed and stable  Last Vitals:  Vitals Value Taken Time  BP 133/63 12/16/20 2022  Temp    Pulse 92 12/16/20 2022  Resp 21 12/16/20 2022  SpO2 97 % 12/16/20 2022  Vitals shown include unvalidated device data.  Last Pain:  Vitals:   12/16/20 1637  PainSc: 4          Complications: No complications documented.

## 2020-12-16 NOTE — Telephone Encounter (Signed)
The preliminary urine culture results are not very impressive.  We should get him in and get a STAT CT on him.

## 2020-12-16 NOTE — ED Notes (Signed)
Paged MD regarding consent. This RN awaiting further action.

## 2020-12-16 NOTE — Op Note (Signed)
Preoperative diagnosis:  1. Left distal ureteral calculi with obstruction  Postoperative diagnosis:  1. Same  Procedure:  1. Cystoscopy 2. Left ureteroscopy 3. Left ureteral stent placement (6FR/24 cm) 4. Left retrograde pyelography with interpretation   Surgeon: Nicki Reaper C. Cailen Mihalik, M.D.  Anesthesia: General  Complications: None  Intraoperative findings:  1. Cystoscopy-urethra normal in caliber without strictures; healing, resected prostate with open fossa.  Fibrinous exudate noted.  With scope position and verumontanum view was open to the bladder; heavily trabeculated bladder with cellules, diverticula.  Clear efflux right UO.  No efflux seen from left UO 2. Left retrograde pyelogram-mild left hydronephrosis and mild hydroureter to a distal ureteral calculus  EBL: Minimal  Specimens: Urine left renal pelvis for culture  Indication: David Myers is an 87 y.o. male with recent TURP April 2022.  For the past 3 weeks he has complained of malaise, decreased appetite and fever to 101 degrees.  He had a positive urine culture and symptoms improved on Levaquin however once completing the antibiotic course he began to have worsening malaise and fever to 100.7.  He was seen today and a CT was obtained which showed no evidence of prostatic abscess however there was new moderate left hydronephrosis with hydroureter to calculi in the distal ureter measuring 4 and 7 mm.  He has been admitted to the hospital service and stent placement this evening was recommended.  After reviewing the management options for treatment, he elected to proceed with the above surgical procedure(s). We have discussed the potential benefits and risks of the procedure, side effects of the proposed treatment, the likelihood of the patient achieving the goals of the procedure, and any potential problems that might occur during the procedure or recuperation. Informed consent has been obtained.  Description of  procedure:  The patient was taken to the operating room and general anesthesia was induced.  The patient was placed in the dorsal lithotomy position, prepped and draped in the usual sterile fashion.  He received antibiotics in the ED. A preoperative time-out was performed.   A 21 French cystoscope was lubricated, passed under direct vision and advanced proximally into the bladder with findings as described above.  The left ureteral orifice was identified However a 0.038 Sensor wire would not advance beyond 1 cm in the distal ureter with a calculus noted superior and lateral to this point.  A 0.038 Glidewire was then placed through a 5 [place] open-ended ureteral catheter positioned at the ureteral orifice and was able to be advanced to the calculus under fluoroscopy however could not be advanced beyond the stone.  The cystoscope was removed and a 4.5 French semirigid ureteroscope was passed under direct vision.  The ureteroscope was gently placed into the left ureteral orifice and advanced proximally and the stone was identified in the distal ureter.  The Sensor wire was then passed under direct vision beyond the stone with brisk efflux of cloudy urine once this was performed.  The ureteroscope was removed and a 5 [place] open-ended ureteral catheter was advanced over the guidewire to the level of the renal pelvis.  The guidewire was removed and 10 cc of cloudy urine was aspirated and sent for culture.  Retrograde pyelogram was then performed through the ureteral catheter with findings as described above.  The guidewire was replaced and the ureteral catheter was removed.  A 6FR/24 cm Contour ureteral stent was then advanced over the wire under fluoroscopic guidance.  The proximal curl was within an upper pole calyx and  the distal curl was well positioned in the bladder.  A 16 French Foley catheter was placed to maximize drainage.  After anesthetic reversal the patient was transported to the PACU in  stable condition.  Plan:  Foley catheter drainage for 24-48 hours  Will schedule definitive stone treatment in 2-3 weeks   [name], MD

## 2020-12-16 NOTE — H&P (Signed)
.   Chief Complaint: Patient was referred from urology clinic for cystoscopy and urgent stent placement due to new left-sided hydronephrosis and UTI.  HPI:  Patient is a poor historian at this time due to his condition.  He is still under effects of anesthesia hence could not give me any significant history.  Most of the history was obtained from wife.  David Myers is an 87 y.o. male who is status post TURP in 10/2020 by Dr Bernardo Heater.  Other comorbidities include diabetes mellitus, kidney stones, history of stroke,obstructive sleep apnea not compliant with CPAP.  As per wife, post TURP, patient has been having recurrent UTI/prostatitis and has been on and off antibiotics for several weeks.  He was last on levofloxacin for treatment of recurrent UTI but continued to have persistent fever with associated lower abdominal discomfort.  He went back to see urology today, urinalysis was obtained in the office and was suggestive of UTI.  Stat CT scan was requested by urology and result came back suggesting new left hydronephrosis with obstructive nephrolithiasis.  Urology recommended patient to be admitted for urgent cystoscopy and stent placement.  Procedure was done this evening.  No perioperative or postop complications reported.  Patient was referred to hospitalist service for admission and further management.  Review of records, patient gram-negative rod UTI on 04/2 8.  Cultures isolated Stenotrophomonas maltophilia which was multidrug-resistant with sensitive to only to levofloxacin.  Repeat cultures on 05/10 shows persistent gram-negative UTI.   Past Medical History:  Diagnosis Date  . Arthritis   . Diabetes mellitus without complication (HCC)    diet controlled  . GERD (gastroesophageal reflux disease)   . History of hiatal hernia   . History of kidney stones   . Hypertension   . Sleep apnea    does not uses CPAP machine for many years   . Stroke [facility])    2018    Past Surgical  History:  Procedure Laterality Date  . CATARACT EXTRACTION W/PHACO Left 08/12/2015   Procedure: CATARACT EXTRACTION PHACO AND INTRAOCULAR LENS PLACEMENT (IOC);  Surgeon: Leandrew Koyanagi, MD;  Location: [facility];  Service: Ophthalmology;  Laterality: Left;  DIABETIC - diet controlled TORIC  . CATARACT EXTRACTION W/PHACO Right 09/09/2015   Procedure: CATARACT EXTRACTION PHACO AND INTRAOCULAR LENS PLACEMENT (IOC);  Surgeon: Leandrew Koyanagi, MD;  Location: [facility];  Service: Ophthalmology;  Laterality: Right;  DIABETIC - diet controlled  . COLONOSCOPY    . EYE SURGERY    . TRANSURETHRAL RESECTION OF PROSTATE N/A 11/17/2020   Procedure: TRANSURETHRAL RESECTION OF THE PROSTATE (TURP);  Surgeon: Abbie Sons, MD;  Location: [facility];  Service: Urology;  Laterality: N/A;  . WRIST SURGERY      Family History  Problem Relation Age of Onset  . Hypertension Mother    Social History:  reports that he has never smoked. He has never used smokeless tobacco. He reports that he does not drink alcohol and does not use drugs.  Allergies:  Allergies  Allergen Reactions  . Augmentin [Amoxicillin-Pot Clavulanate] Diarrhea    Led to C-diff     Medications Prior to Admission  Medication Sig Dispense Refill  . aspirin EC 81 MG tablet Take 81 mg by mouth daily.    . Cholecalciferol (VITAMIN D3) 25 MCG (1000 UT) CAPS Take 1,000 Units by mouth daily.    Marland Kitchen docusate sodium (COLACE) 100 MG capsule Take 1 capsule (100 mg total) by mouth 2 (two) times daily. 10 capsule  0  . famotidine (PEPCID) 40 MG tablet Take 40 mg by mouth at bedtime.    Marland Kitchen losartan-hydrochlorothiazide (HYZAAR) 50-12.5 MG tablet Take 0.5 tablets by mouth daily.    . Netarsudil-Latanoprost (ROCKLATAN) 0.02-0.005 % SOLN Apply 1 drop to eye daily.    . pantoprazole (PROTONIX) 40 MG tablet Take 40 mg by mouth daily.    . simvastatin (ZOCOR) 20 MG tablet Take 20 mg by mouth at bedtime.    Marland Kitchen HYDROcodone-acetaminophen  (NORCO/VICODIN) 5-325 MG tablet Take 1-2 tablets by mouth every 6 (six) hours as needed for moderate pain. (Patient not taking: No sig reported) 12 tablet 0  . levofloxacin (LEVAQUIN) 500 MG tablet Take 1 tablet (500 mg total) by mouth daily. 7 tablet 0  . montelukast (SINGULAIR) 5 MG chewable tablet Chew by mouth. (Patient not taking: No sig reported)      Results for orders placed or performed during the hospital encounter of 12/16/20 (from the past 48 hour(s))  CBC     Status: Abnormal   Collection Time: 12/16/20  4:39 PM  Result Value Ref Range   WBC 11.6 (H) 4.0 - 10.5 K/uL   RBC 3.96 (L) 4.22 - 5.81 MIL/uL   Hemoglobin 12.6 (L) 13.0 - 17.0 g/dL   HCT 36.1 (L) 39.0 - 52.0 %   MCV 91.2 80.0 - 100.0 fL   MCH 31.8 26.0 - 34.0 pg   MCHC 34.9 30.0 - 36.0 g/dL   RDW 12.7 11.5 - 15.5 %   Platelets 308 150 - 400 K/uL   nRBC 0.0 0.0 - 0.2 %    Comment: Performed at [facility], [address] [place], [place] [address]  Basic metabolic panel     Status: Abnormal   Collection Time: 12/16/20  4:39 PM  Result Value Ref Range   Sodium 131 (L) 135 - 145 mmol/L   Potassium 4.6 3.5 - 5.1 mmol/L   Chloride 95 (L) 98 - 111 mmol/L   CO2 23 22 - 32 mmol/L   Glucose, Bld 143 (H) 70 - 99 mg/dL    Comment: Glucose reference range applies only to samples taken after fasting for at least 8 hours.   BUN 32 (H) 8 - 23 mg/dL   Creatinine, Ser 2.58 (H) 0.61 - 1.24 mg/dL   Calcium 9.3 8.9 - 10.3 mg/dL   GFR, Estimated 23 (L) >60 mL/min    Comment: (NOTE) Calculated using the CKD-EPI Creatinine Equation (2021)    Anion gap 13 5 - 15    Comment: Performed at [facility], [address] [place], [place] [address]  Lactic acid, plasma     Status: None   Collection Time: 12/16/20  4:39 PM  Result Value Ref Range   Lactic Acid, Venous 1.5 0.5 - 1.9 mmol/L    Comment: Performed at [facility], [address] [place], [place] [address]  Resp Panel by RT-PCR (Flu A&B,  Covid) Nasopharyngeal Swab     Status: None   Collection Time: 12/16/20  6:05 PM   Specimen: Nasopharyngeal Swab; Nasopharyngeal(NP) swabs in vial transport medium  Result Value Ref Range   SARS Coronavirus 2 by RT PCR NEGATIVE NEGATIVE    Comment: (NOTE) SARS-CoV-2 target nucleic acids are NOT DETECTED.  The SARS-CoV-2 RNA is generally detectable in upper respiratory specimens during the acute phase of infection. The lowest concentration of SARS-CoV-2 viral copies this assay can detect is 138 copies/mL. A negative result does not preclude SARS-Cov-2 infection and should not  be used as the sole basis for treatment or other patient management decisions. A negative result may occur with  improper specimen collection/handling, submission of specimen other than nasopharyngeal swab, presence of viral mutation(s) within the areas targeted by this assay, and inadequate number of viral copies(<138 copies/mL). A negative result must be combined with clinical observations, patient history, and epidemiological information. The expected result is Negative.  Fact Sheet for Patients:  EntrepreneurPulse.com.au  Fact Sheet for Healthcare Providers:  IncredibleEmployment.be  This test is no t yet approved or cleared by the [place] FDA and  has been authorized for detection and/or diagnosis of SARS-CoV-2 by FDA under an Emergency Use Authorization (EUA). This EUA will remain  in effect (meaning this test can be used) for the duration of the COVID-19 declaration under Section 564(b)(1) of the Act, 21 U.S.C.section 360bbb-3(b)(1), unless the authorization is terminated  or revoked sooner.       Influenza A by PCR NEGATIVE NEGATIVE   Influenza B by PCR NEGATIVE NEGATIVE    Comment: (NOTE) The Xpert Xpress SARS-CoV-2/FLU/RSV plus assay is intended as an aid in the diagnosis of influenza from Nasopharyngeal swab specimens and should not be used as a sole  basis for treatment. Nasal washings and aspirates are unacceptable for Xpert Xpress SARS-CoV-2/FLU/RSV testing.  Fact Sheet for Patients: EntrepreneurPulse.com.au  Fact Sheet for Healthcare Providers: IncredibleEmployment.be  This test is not yet approved or cleared by the [place] FDA and has been authorized for detection and/or diagnosis of SARS-CoV-2 by FDA under an Emergency Use Authorization (EUA). This EUA will remain in effect (meaning this test can be used) for the duration of the COVID-19 declaration under Section 564(b)(1) of the Act, 21 U.S.C. section 360bbb-3(b)(1), unless the authorization is terminated or revoked.  Performed at [facility], [address] [place], [place] [address]   Urinalysis, Complete w Microscopic Urine, Clean Catch     Status: Abnormal   Collection Time: 12/16/20  6:05 PM  Result Value Ref Range   Color, Urine YELLOW (A) YELLOW   APPearance CLEAR (A) CLEAR   Specific Gravity, Urine 1.010 1.005 - 1.030   pH 6.0 5.0 - 8.0   Glucose, UA NEGATIVE NEGATIVE mg/dL   Hgb urine dipstick MODERATE (A) NEGATIVE   Bilirubin Urine NEGATIVE NEGATIVE   Ketones, ur NEGATIVE NEGATIVE mg/dL   Protein, ur NEGATIVE NEGATIVE mg/dL   Nitrite NEGATIVE NEGATIVE   Leukocytes,Ua MODERATE (A) NEGATIVE   RBC / HPF 21-50 0 - 5 RBC/hpf   WBC, UA 21-50 0 - 5 WBC/hpf   Bacteria, UA NONE SEEN NONE SEEN   Squamous Epithelial / LPF NONE SEEN 0 - 5    Comment: Performed at [facility], [address] [place], [place] [address]   DG OR UROLOGY CYSTO IMAGE [facility] ONLY)  Result Date: 12/16/2020 There is no interpretation for this exam.  This order is for images obtained during a surgical procedure.  Please See "Surgeries" Tab for more information regarding the procedure.   CT RENAL STONE STUDY  Result Date: 12/16/2020 CLINICAL DATA:  Left flank pain. Hematuria. Fever and dysuria. Recent TURP procedure. EXAM: CT  ABDOMEN AND PELVIS WITHOUT CONTRAST TECHNIQUE: Multidetector CT imaging of the abdomen and pelvis was performed following the standard protocol without IV contrast. COMPARISON:  02/12/2014 FINDINGS: Lower chest: No acute findings. Chronic interstitial lung disease noted in both lung bases. Hepatobiliary: No mass visualized on this unenhanced exam. Gallbladder is unremarkable. No evidence of biliary ductal dilatation. Pancreas:  No mass or inflammatory process visualized on this unenhanced exam. Spleen:  Within normal limits in size. Adrenals/Urinary tract: A few renal calculi are seen bilaterally, largest in lower pole of left kidney measuring 10 mm. New moderate left hydroureteronephrosis is seen, due to 2 adjacent calculi in the distal left ureter measuring 7 mm and 4 mm. Diffuse bladder wall thickening and trabeculation again seen, consistent with chronic bladder outlet obstruction. Stomach/Bowel: Increased size of moderate hiatal hernia noted. No evidence of obstruction, inflammatory process, or abnormal fluid collections. Normal appendix visualized. Diverticulosis is seen mainly involving the sigmoid colon, however there is no evidence of diverticulitis. Vascular/Lymphatic: No pathologically enlarged lymph nodes identified. No evidence of abdominal aortic aneurysm. Aortic atherosclerotic calcification noted. Reproductive: Mildly enlarged prostate gland again noted TURP defect which is new since previous study. Other:  Stable small left inguinal hernia containing only fat. Musculoskeletal:  No suspicious bone lesions identified. IMPRESSION: New moderate left hydroureteronephrosis due to 2 adjacent calculi in distal left ureter measuring 7 mm and 4 mm. Bilateral nephrolithiasis. Mildly enlarged prostate gland and findings of chronic bladder outlet obstruction. New TURP defect noted. Increased size of moderate hiatal hernia. Colonic diverticulosis. No radiographic evidence of diverticulitis. Aortic Atherosclerosis  (ICD10-I70.0). Electronically Signed   By: Marlaine Hind M.D.   On: 12/16/2020 16:21    Review of Systems  Unable to perform ROS: Acuity of condition    Blood pressure 116/66, pulse 88, temperature 98.1 F (36.7 C), resp. rate (!) 22, SpO2 97 %. Physical Exam Vitals and nursing note reviewed. Exam conducted with a chaperone present.  Constitutional:      Appearance: He is normal weight. He is ill-appearing.     Comments: Patient is s/p Surgery and was groggy at time of evaluation. History was limited from patient  HENT:     Head: Normocephalic and atraumatic.  Eyes:     Extraocular Movements: Extraocular movements intact.  Cardiovascular:     Rate and Rhythm: Normal rate and regular rhythm.     Pulses: Normal pulses.     Heart sounds: Normal heart sounds.  Pulmonary:     Effort: Pulmonary effort is normal.     Breath sounds: Normal breath sounds.  Abdominal:     General: Abdomen is flat. Bowel sounds are normal.     Palpations: Abdomen is soft.  Musculoskeletal:     Comments: Difficult to assess at this time   Skin:    General: Skin is warm.  Neurological:     General: No focal deficit present.      Assessment/Plan  Acute left pyelonephritis with evidence of obstructive nephrolithiasis and new findings of hydronephrosis on CT scan.  Patient has not exhibited signs of sepsis at this time.  Microbiology records reviewed shows sternotrophomonas which is multidrug-resistant.  Patient will be resumed on IV levofloxacin.  Repeat urine and blood cultures pending.  Will tailor antibiotics accordingly.  ID consult and evaluation may be warranted.  Left hydronephrosis secondary to obstructive nephrolithiasis.  Patient is status post cystoscopy with stent placement.  Urine shows clear urine.  Chronic kidney disease stage IV: Renal function remained stable.  Hypertension: Blood pressure stable.   [place], MD 12/16/2020, 8:46 PM

## 2020-12-16 NOTE — Progress Notes (Signed)
PHARMACY NOTE:  ANTIMICROBIAL RENAL DOSAGE ADJUSTMENT  Current antimicrobial regimen includes a mismatch between antimicrobial dosage and estimated renal function.  As per policy approved by the Pharmacy & Therapeutics and Medical Executive Committees, the antimicrobial dosage will be adjusted accordingly.  Current antimicrobial dosage:  Levofloxacin 500 mg IV q24h  Indication: Pyelonephritis  Renal Function:  Estimated Creatinine Clearance: 17.8 mL/min (A) (by C-G formula based on SCr of 2.58 mg/dL (H)).    Antimicrobial dosage has been changed to:  Levofloxacin 750 mg IV x 1 followed by levofloxacin 500 mg IV q48h  Thank you for allowing pharmacy to be a part of this patient's care.  Benita Gutter, [facility] 12/16/2020 9:48 PM

## 2020-12-16 NOTE — Anesthesia Postprocedure Evaluation (Signed)
Anesthesia Post Note  Patient: David Myers  Procedure(s) Performed: CYSTOSCOPY WITH STENT PLACEMENT (Left ) URETEROSCOPY (Left )  Patient location during evaluation: PACU Anesthesia Type: General Level of consciousness: awake and alert Pain management: pain level controlled Vital Signs Assessment: post-procedure vital signs reviewed and stable Respiratory status: spontaneous breathing, nonlabored ventilation, respiratory function stable and patient connected to nasal cannula oxygen Cardiovascular status: blood pressure returned to baseline and stable Postop Assessment: no apparent nausea or vomiting Anesthetic complications: no   No complications documented.   Last Vitals:  Vitals:   12/16/20 2045 12/16/20 2100  BP: (!) 116/59 114/60  Pulse: 82 (!) 37  Resp: (!) 30 18  Temp:    SpO2: 95% 95%    Last Pain:  Vitals:   12/16/20 2045  PainSc: 0-No pain                 Precious Haws Laban Orourke

## 2020-12-16 NOTE — Telephone Encounter (Signed)
Patient's wife is calling this morning to check on the status of the urine culture.  It looks like the results are still preliminary.  Patient has completed the cycle of levaquin, he is still experiencing lower abdominal discomfort, increased urinary frequency (every hour or so), weakness, loss of appetite and has had a temperature of 100.7 last night and this morning.   She is looking for advice on how to proceed.

## 2020-12-16 NOTE — Consult Note (Signed)
Mr. Weigel is an 87 y.o. male who underwent TURP 11/17/2020.  For the last 3 weeks he has had malaise, low-grade fever with a max temp of 101 degrees associated with chills.  Urine culture did not grow stenotrophomonas maltophilia that was sensitive to Levaquin.  He had a follow-up on 5/10 and was feeling better after starting the Levaquin.  He finished his antibiotics 3-4 days ago and after completing began to have low-grade fever and chills with a max temp of 100.7 he complains of lower abdominal pain frequency and urgency.  PVR today was 166 mL.  A CT of the abdomen pelvis was performed which showed no evidence of prostatic abscess however he was incidentally found to have moderate left hydronephrosis and hydroureter to the distal ureter with 2 distal ureteral calculi measuring 4 and 7 mm.   He has been admitted to the hospitalist service for IV antibiotics and left ureteral stent placement has been recommended.  Exam: VSS, temp 98.5 General-alert in no acute distress Abdomen-soft nontender  Impression:  Moderate left hydronephrosis/hydroureter secondary to distal ureteral calculi.  This may be the source of his fever and malaise  Recommendation:  Cystoscopy with placement left ureteral stent  The procedure was discussed in detail and it was stressed no attempt will be made to remove the stone due to the possibility of bacteremia/sepsis.  Potential risk were discussed including bleeding, worsening sepsis and ureteral injury.  All questions were answered and he desires to proceed   [name], MD

## 2020-12-16 NOTE — Anesthesia Preprocedure Evaluation (Signed)
Anesthesia Evaluation  Patient identified by MRN, date of birth, ID band Patient awake    Reviewed: Allergy & Precautions, H&P , NPO status , Patient's Chart, lab work & pertinent test results  History of Anesthesia Complications Negative for: history of anesthetic complications  Airway Mallampati: III  TM Distance: >3 FB Neck ROM: full    Dental  (+) Chipped   Pulmonary neg shortness of breath, sleep apnea ,    Pulmonary exam normal        Cardiovascular Exercise Tolerance: Good hypertension, (-) angina+ Peripheral Vascular Disease  Normal cardiovascular exam     Neuro/Psych PSYCHIATRIC DISORDERS TIA Neuromuscular disease CVA    GI/Hepatic Neg liver ROS, hiatal hernia, GERD  Medicated and Controlled,  Endo/Other  diabetes, Type 2  Renal/GU Renal disease     Musculoskeletal   Abdominal   Peds  Hematology negative hematology ROS (+)   Anesthesia Other Findings Past Medical History: No date: Arthritis No date: Diabetes mellitus without complication (HCC)     Comment:  diet controlled No date: GERD (gastroesophageal reflux disease) No date: History of hiatal hernia No date: History of kidney stones No date: Hypertension No date: Sleep apnea     Comment:  does not uses CPAP machine for many years  No date: Stroke [facility])     Comment:  2018  Past Surgical History: 08/12/2015: CATARACT EXTRACTION W/PHACO; Left     Comment:  Procedure: CATARACT EXTRACTION PHACO AND INTRAOCULAR               LENS PLACEMENT (IOC);  Surgeon: Leandrew Koyanagi, MD;               Location: [place];  Service: Ophthalmology;                Laterality: Left;  DIABETIC - diet controlled TORIC 09/09/2015: CATARACT EXTRACTION W/PHACO; Right     Comment:  Procedure: CATARACT EXTRACTION PHACO AND INTRAOCULAR               LENS PLACEMENT (IOC);  Surgeon: Leandrew Koyanagi, MD;               Location: [place];   Service: Ophthalmology;                Laterality: Right;  DIABETIC - diet controlled No date: COLONOSCOPY No date: EYE SURGERY 11/17/2020: TRANSURETHRAL RESECTION OF PROSTATE; N/A     Comment:  Procedure: TRANSURETHRAL RESECTION OF THE PROSTATE               (TURP);  Surgeon: Abbie Sons, MD;  Location: [facility]               ORS;  Service: Urology;  Laterality: N/A; No date: WRIST SURGERY     Reproductive/Obstetrics negative OB ROS                             Anesthesia Physical Anesthesia Plan  ASA: IV and emergent  Anesthesia Plan: General ETT   Post-op Pain Management:    Induction: Intravenous  PONV Risk Score and Plan: Ondansetron, Dexamethasone, Midazolam and Treatment may vary due to age or medical condition  Airway Management Planned: Oral ETT  Additional Equipment:   Intra-op Plan:   Post-operative Plan: Extubation in OR  Informed Consent: I have reviewed the patients History and Physical, chart, labs and discussed the procedure including the risks, benefits and alternatives for the proposed anesthesia  with the patient or authorized representative who has indicated his/her understanding and acceptance.     Dental Advisory Given  Plan Discussed with: Anesthesiologist, CRNA and Surgeon  Anesthesia Plan Comments: (Patient consented for risks of anesthesia including but not limited to:  - adverse reactions to medications - damage to eyes, teeth, lips or other oral mucosa - nerve damage due to positioning  - sore throat or hoarseness - Damage to heart, brain, nerves, lungs, other parts of body or loss of life  Patient voiced understanding.)        Anesthesia Quick Evaluation

## 2020-12-16 NOTE — Anesthesia Procedure Notes (Signed)
Procedure Name: Intubation Date/Time: 12/16/2020 7:24 PM Performed by: Jonna Clark, CRNA Pre-anesthesia Checklist: Patient identified, Patient being monitored, Timeout performed, Emergency Drugs available and Suction available Patient Re-evaluated:Patient Re-evaluated prior to induction Oxygen Delivery Method: Circle system utilized Preoxygenation: Pre-oxygenation with 100% oxygen Induction Type: IV induction Ventilation: Mask ventilation without difficulty Laryngoscope Size: 3 and McGraph Grade View: Grade I Tube type: Oral Tube size: 7.5 mm Number of attempts: 1 Airway Equipment and Method: Stylet Placement Confirmation: ETT inserted through vocal cords under direct vision,  positive ETCO2 and breath sounds checked- equal and bilateral Secured at: 23 cm Tube secured with: Tape Dental Injury: Teeth and Oropharynx as per pre-operative assessment

## 2020-12-16 NOTE — Telephone Encounter (Signed)
Added patient to Shannon's schedule today.  Advised patient and his wife.

## 2020-12-16 NOTE — ED Provider Notes (Signed)
[facility] Emergency Department Provider Note  ____________________________________________   Event Date/Time   First MD Initiated Contact with Patient 12/16/20 1713     (approximate)  I have reviewed the triage vital signs and the nursing notes.   HISTORY  Chief Complaint Stones in ureter   HPI David Myers is a 87 y.o. male with a past medical history of arthritis, DM, GERD, OSA, CVA and BPH status post recent TURP on 4/19 and known kidney stones with recurrent urinary tract infections who presents to the ED from urology office with concerns for some persistent infection after recently completing a course of levofloxacin CT today concerning for left-sided ureteral stones with some hydronephrosis.  Patient states he has had on and off fevers the last couple weeks as well as some left lower quadrant abdominal pain and flank pain.  He denies any gross hematuria or burning with urination.  Endorses a little bit of diarrhea last couple days but no new headache, earache, sore throat, chest pain, cough, shortness of breath, rash right-sided abdominal pain or back pain.  No recent injuries or falls.  No clearly getting aggravating factors.  No other acute concerns at this time.         Past Medical History:  Diagnosis Date  . Arthritis   . Diabetes mellitus without complication (HCC)    diet controlled  . GERD (gastroesophageal reflux disease)   . History of hiatal hernia   . History of kidney stones   . Hypertension   . Sleep apnea    does not uses CPAP machine for many years   . Stroke [facility])    2018    Patient Active Problem List   Diagnosis Date Noted  . S/P TURP (status post transurethral resection of prostate) 11/17/2020  . Vascular dementia without behavioral disturbance ([place]) 04/30/2020  . Chronic bilateral low back pain with bilateral sciatica 03/05/2020  . Lumbar stenosis with neurogenic claudication 03/05/2020  . History of  transient ischemic attack (TIA) 06/15/2018  . Numbness and tingling 06/09/2018  . TIA (transient ischemic attack) 06/09/2018  . Diabetes mellitus with peripheral angiopathy ([place]) 04/09/2018  . Hyperlipidemia, mixed 03/28/2017  . Low serum vitamin D 09/26/2016  . Medicare annual wellness visit, initial 09/26/2016  . 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  . B12 deficiency 03/23/2015  . Elevated PSA, less than 10 ng/ml 03/23/2015  . Degenerative lumbar disc 10/08/2014  . Nephrolithiasis 02/10/2014    Past Surgical History:  Procedure Laterality Date  . CATARACT EXTRACTION W/PHACO Left 08/12/2015   Procedure: CATARACT EXTRACTION PHACO AND INTRAOCULAR LENS PLACEMENT (IOC);  Surgeon: Leandrew Koyanagi, MD;  Location: [place];  Service: Ophthalmology;  Laterality: Left;  DIABETIC - diet controlled TORIC  . CATARACT EXTRACTION W/PHACO Right 09/09/2015   Procedure: CATARACT EXTRACTION PHACO AND INTRAOCULAR LENS PLACEMENT (IOC);  Surgeon: Leandrew Koyanagi, MD;  Location: [place];  Service: Ophthalmology;  Laterality: Right;  DIABETIC - diet controlled  . COLONOSCOPY    . EYE SURGERY    . TRANSURETHRAL RESECTION OF PROSTATE N/A 11/17/2020   Procedure: TRANSURETHRAL RESECTION OF THE PROSTATE (TURP);  Surgeon: Abbie Sons, MD;  Location: [facility];  Service: Urology;  Laterality: N/A;  . WRIST SURGERY      Prior to Admission medications   Medication Sig Start Date End Date Taking? Authorizing Provider  aspirin EC 81 MG tablet Take 81 mg by mouth daily.    [provider]  Cholecalciferol (VITAMIN D3) 25  MCG (1000 UT) CAPS Take 1,000 Units by mouth daily.    [provider]  docusate sodium (COLACE) 100 MG capsule Take 1 capsule (100 mg total) by mouth 2 (two) times daily. 11/18/20   Vaillancourt, Aldona Bar, PA-C  famotidine (PEPCID) 40 MG tablet Take 40 mg by mouth at bedtime. 05/28/20   [provider]  HYDROcodone-acetaminophen  (NORCO/VICODIN) 5-325 MG tablet Take 1-2 tablets by mouth every 6 (six) hours as needed for moderate pain. 11/18/20   Vaillancourt, Aldona Bar, PA-C  levofloxacin (LEVAQUIN) 500 MG tablet Take 1 tablet (500 mg total) by mouth daily. 12/16/20   McGowan, Hunt Oris, PA-C  losartan-hydrochlorothiazide (HYZAAR) 50-12.5 MG tablet Take 0.5 tablets by mouth daily. 05/16/19 11/10/20  [provider]  montelukast (SINGULAIR) 5 MG chewable tablet Chew by mouth. 11/24/20 11/24/21  [provider]  Netarsudil-Latanoprost (ROCKLATAN) 0.02-0.005 % SOLN Apply 1 drop to eye daily.    [provider]  pantoprazole (PROTONIX) 40 MG tablet Take 40 mg by mouth daily. 09/17/19   [provider]  simvastatin (ZOCOR) 20 MG tablet Take 20 mg by mouth at bedtime. 06/03/19   [provider]    Allergies Augmentin [amoxicillin-pot clavulanate]  Family History  Problem Relation Age of Onset  . Hypertension Mother     Social History Social History   Tobacco Use  . Smoking status: Never Smoker  . Smokeless tobacco: Never Used  Vaping Use  . Vaping Use: Never used  Substance Use Topics  . Alcohol use: No  . Drug use: Never    Review of Systems  Review of Systems  Constitutional: Positive for chills, fever and malaise/fatigue.  HENT: Negative for sore throat.   Eyes: Negative for pain.  Respiratory: Negative for cough and stridor.   Cardiovascular: Negative for chest pain.  Gastrointestinal: Positive for abdominal pain. Negative for vomiting.  Genitourinary: Positive for flank pain.  Musculoskeletal: Negative for myalgias.  Skin: Negative for rash.  Neurological: Negative for seizures, loss of consciousness and headaches.  Psychiatric/Behavioral: Negative for suicidal ideas.  All other systems reviewed and are negative.     ____________________________________________   PHYSICAL EXAM:  VITAL SIGNS: ED Triage Vitals  Enc Vitals Group     BP 12/16/20 1638  123/73     Pulse Rate 12/16/20 1638 75     Resp 12/16/20 1638 19     Temp 12/16/20 1638 98.5 F (36.9 C)     Temp src --      SpO2 12/16/20 1638 94 %     Weight --      Height --      Head Circumference --      Peak Flow --      Pain Score 12/16/20 1637 4     Pain Loc --      Pain Edu? --      Excl. in [place]? --    Vitals:   12/16/20 1638  BP: 123/73  Pulse: 75  Resp: 19  Temp: 98.5 F (36.9 C)  SpO2: 94%   Physical Exam Vitals and nursing note reviewed.  Constitutional:      Appearance: He is well-developed.  HENT:     Head: Normocephalic and atraumatic.     Right Ear: External ear normal.     Left Ear: External ear normal.     Mouth/Throat:     Mouth: Mucous membranes are dry.  Eyes:     Conjunctiva/sclera: Conjunctivae normal.  Cardiovascular:     Rate and Rhythm:  Normal rate and regular rhythm.     Heart sounds: No murmur heard.   Pulmonary:     Effort: Pulmonary effort is normal. No respiratory distress.     Breath sounds: Normal breath sounds.  Abdominal:     Palpations: Abdomen is soft.     Tenderness: There is no abdominal tenderness. There is left CVA tenderness. There is no right CVA tenderness.  Musculoskeletal:     Cervical back: Neck supple.  Skin:    General: Skin is warm and dry.     Capillary Refill: Capillary refill takes 2 to 3 seconds.  Neurological:     Mental Status: He is alert and oriented to person, place, and time.  Psychiatric:        Mood and Affect: Mood normal.      ____________________________________________   LABS (all labs ordered are listed, but only abnormal results are displayed)  Labs Reviewed  CBC - Abnormal; Notable for the following components:      Result Value   WBC 11.6 (*)    RBC 3.96 (*)    Hemoglobin 12.6 (*)    HCT 36.1 (*)    All other components within normal limits  BASIC METABOLIC PANEL - Abnormal; Notable for the following components:   Sodium 131 (*)    Chloride 95 (*)    Glucose, Bld 143 (*)     BUN 32 (*)    Creatinine, Ser 2.58 (*)    GFR, Estimated 23 (*)    All other components within normal limits  CULTURE, BLOOD (ROUTINE X 2)  CULTURE, BLOOD (ROUTINE X 2)  RESP PANEL BY RT-PCR (FLU A&B, COVID) ARPGX2  URINE CULTURE  LACTIC ACID, PLASMA  LACTIC ACID, PLASMA  URINALYSIS, COMPLETE (UACMP) WITH MICROSCOPIC   ____________________________________________  EKG  ____________________________________________  RADIOLOGY  ED MD interpretation: Left-sided hydronephrosis secondary to 2 adjacent calculi in the left distal ureter at 7 mm and 4 mm.  There are bilateral kidney stones noted.  There is a mildly enlarged prostate on chronic bladder outlet obstruction.  Hiatal hernia and aortic atherosclerosis as well as evidence of colonic diverticulosis without evidence of diverticulitis.  Official radiology report(s): DG OR UROLOGY CYSTO IMAGE ([place])  Result Date: 12/16/2020 There is no interpretation for this exam.  This order is for images obtained during a surgical procedure.  Please See "Surgeries" Tab for more information regarding the procedure.   CT RENAL STONE STUDY  Result Date: 12/16/2020 CLINICAL DATA:  Left flank pain. Hematuria. Fever and dysuria. Recent TURP procedure. EXAM: CT ABDOMEN AND PELVIS WITHOUT CONTRAST TECHNIQUE: Multidetector CT imaging of the abdomen and pelvis was performed following the standard protocol without IV contrast. COMPARISON:  02/12/2014 FINDINGS: Lower chest: No acute findings. Chronic interstitial lung disease noted in both lung bases. Hepatobiliary: No mass visualized on this unenhanced exam. Gallbladder is unremarkable. No evidence of biliary ductal dilatation. Pancreas: No mass or inflammatory process visualized on this unenhanced exam. Spleen:  Within normal limits in size. Adrenals/Urinary tract: A few renal calculi are seen bilaterally, largest in lower pole of left kidney measuring 10 mm. New moderate left hydroureteronephrosis is  seen, due to 2 adjacent calculi in the distal left ureter measuring 7 mm and 4 mm. Diffuse bladder wall thickening and trabeculation again seen, consistent with chronic bladder outlet obstruction. Stomach/Bowel: Increased size of moderate hiatal hernia noted. No evidence of obstruction, inflammatory process, or abnormal fluid collections. Normal appendix visualized. Diverticulosis is seen mainly involving the sigmoid colon,  however there is no evidence of diverticulitis. Vascular/Lymphatic: No pathologically enlarged lymph nodes identified. No evidence of abdominal aortic aneurysm. Aortic atherosclerotic calcification noted. Reproductive: Mildly enlarged prostate gland again noted TURP defect which is new since previous study. Other:  Stable small left inguinal hernia containing only fat. Musculoskeletal:  No suspicious bone lesions identified. IMPRESSION: New moderate left hydroureteronephrosis due to 2 adjacent calculi in distal left ureter measuring 7 mm and 4 mm. Bilateral nephrolithiasis. Mildly enlarged prostate gland and findings of chronic bladder outlet obstruction. New TURP defect noted. Increased size of moderate hiatal hernia. Colonic diverticulosis. No radiographic evidence of diverticulitis. Aortic Atherosclerosis (ICD10-I70.0). Electronically Signed   By: Marlaine Hind M.D.   On: 12/16/2020 16:21    ____________________________________________   PROCEDURES  Procedure(s) performed (including Critical Care):  Procedures   ____________________________________________   INITIAL IMPRESSION / ASSESSMENT AND PLAN / ED COURSE      Patient presents with above-stated history and exam for several weeks of persistent left lower quadrant abdominal pain associate with some fevers.  He has been on antibiotics and has not been helping.  He had a CT today that showed left-sided stones with hydronephrosis.  He was told to come to the ED to be admitted for likely stent placement.  On arrival he is  afebrile hemodynamically stable.  He does have some mild tenderness in his left lower quadrant of his abdomen.  He also appears little bit dehydrated.  I was able to review his CT obtained earlier today and interpreted below.  CT remarkable for Left-sided hydronephrosis secondary to 2 adjacent calculi in the left distal ureter at 7 mm and 4 mm.  There are bilateral kidney stones noted.  There is a mildly enlarged prostate on chronic bladder outlet obstruction.  Hiatal hernia and aortic atherosclerosis as well as evidence of colonic diverticulosis without evidence of diverticulitis.  Suspect stones likely infected are likely the cause of patient's symptoms.  He has no fever in the emergency room hypotension or tachycardia although he does have a little bit of elevated white blood cell count with a WBC count of 11.6 and hemoglobin of 12.6.  BMP shows a creatinine of 2.58 compared to 2.71 earliest afternoon at 1.254 weeks ago.  This represents an AKI today.  No other significant electrolyte or metabolic derangements.  Lactic acid is 1.2.  Notably patient is septic at this time.  Discussed patient with on-call urologist Dr. Bernardo Heater recommended starting antibiotics and admitting to hospitalist service for plan for likely procedure later this evening.  While it seems UA was collected in clinic I cannot see results of this although per PA note since he had WBCs in RBCs.  We will reorder urine and urine culture as well as blood cultures while patient is in the ED.  I will admit to hospital service for further evaluation and management.       ____________________________________________   FINAL CLINICAL IMPRESSION(S) / ED DIAGNOSES  Final diagnoses:  Kidney stone  Acute cystitis with hematuria  AKI (acute kidney injury) ([place])    Medications  lactated ringers bolus 1,000 mL (has no administration in time range)  cefTRIAXone (ROCEPHIN) 1 g in sodium chloride 0.9 % 100 mL IVPB (1 g Intravenous New  Bag/Given 12/16/20 1806)     ED Discharge Orders    None       Note:  This document was prepared using Dragon voice recognition software and may include unintentional dictation errors.   Lucrezia Starch, MD 12/16/20  1817  

## 2020-12-16 NOTE — ED Notes (Signed)
MD states he will consent pt in OR suite.

## 2020-12-16 NOTE — ED Triage Notes (Addendum)
Pt comes with c/o needing to be admitted. Pt sent by doctor bc of stones in his ureter. Pt states pain and burning with urination.  Wife reports doc concerned pt could be septic. Wife states fever last night.  Scan completed today reveals:New moderate left hydroureteronephrosis due to 2 adjacent calculi in  distal left ureter measuring 7 mm and 4 mm.

## 2020-12-17 ENCOUNTER — Encounter: Payer: Self-pay | Admitting: Urology

## 2020-12-17 DIAGNOSIS — N3001 Acute cystitis with hematuria: Secondary | ICD-10-CM

## 2020-12-17 DIAGNOSIS — N179 Acute kidney failure, unspecified: Secondary | ICD-10-CM

## 2020-12-17 DIAGNOSIS — N189 Chronic kidney disease, unspecified: Secondary | ICD-10-CM

## 2020-12-17 DIAGNOSIS — N12 Tubulo-interstitial nephritis, not specified as acute or chronic: Secondary | ICD-10-CM | POA: Diagnosis not present

## 2020-12-17 DIAGNOSIS — N200 Calculus of kidney: Secondary | ICD-10-CM | POA: Diagnosis not present

## 2020-12-17 LAB — HEMOGLOBIN A1C
Hgb A1c MFr Bld: 7.8 % — ABNORMAL HIGH (ref 4.8–5.6)
Mean Plasma Glucose: 177.16 mg/dL

## 2020-12-17 LAB — CBC
HCT: 32.5 % — ABNORMAL LOW (ref 39.0–52.0)
Hemoglobin: 11.1 g/dL — ABNORMAL LOW (ref 13.0–17.0)
MCH: 31.4 pg (ref 26.0–34.0)
MCHC: 34.2 g/dL (ref 30.0–36.0)
MCV: 92.1 fL (ref 80.0–100.0)
Platelets: 262 10*3/uL (ref 150–400)
RBC: 3.53 MIL/uL — ABNORMAL LOW (ref 4.22–5.81)
RDW: 12.9 % (ref 11.5–15.5)
WBC: 8.2 10*3/uL (ref 4.0–10.5)
nRBC: 0 % (ref 0.0–0.2)

## 2020-12-17 LAB — BASIC METABOLIC PANEL
Anion gap: 10 (ref 5–15)
BUN: 31 mg/dL — ABNORMAL HIGH (ref 8–23)
CO2: 23 mmol/L (ref 22–32)
Calcium: 8.6 mg/dL — ABNORMAL LOW (ref 8.9–10.3)
Chloride: 101 mmol/L (ref 98–111)
Creatinine, Ser: 2.05 mg/dL — ABNORMAL HIGH (ref 0.61–1.24)
GFR, Estimated: 31 mL/min — ABNORMAL LOW (ref >60)
Glucose, Bld: 203 mg/dL — ABNORMAL HIGH (ref 70–99)
Potassium: 4.6 mmol/L (ref 3.5–5.1)
Sodium: 134 mmol/L — ABNORMAL LOW (ref 135–145)

## 2020-12-17 LAB — GLUCOSE, CAPILLARY
Glucose-Capillary: 174 mg/dL — ABNORMAL HIGH (ref 70–99)
Glucose-Capillary: 236 mg/dL — ABNORMAL HIGH (ref 70–99)

## 2020-12-17 MED ORDER — CHLORHEXIDINE GLUCONATE CLOTH 2 % EX PADS
6.0000 | MEDICATED_PAD | Freq: Every day | CUTANEOUS | Status: DC
  Administered 2020-12-17 – 2020-12-26 (×9): 6 via TOPICAL

## 2020-12-17 MED ORDER — FAMOTIDINE 20 MG PO TABS
20.0000 mg | ORAL_TABLET | Freq: Every day | ORAL | Status: DC
  Administered 2020-12-17 – 2020-12-29 (×13): 20 mg via ORAL
  Filled 2020-12-17 (×13): qty 1

## 2020-12-17 MED ORDER — HYDROCHLOROTHIAZIDE 10 MG/ML ORAL SUSPENSION
6.2500 mg | Freq: Every day | ORAL | Status: DC
  Administered 2020-12-18 – 2020-12-21 (×3): 6.25 mg via ORAL
  Filled 2020-12-17 (×7): qty 1.25

## 2020-12-17 MED ORDER — LOSARTAN POTASSIUM 25 MG PO TABS
25.0000 mg | ORAL_TABLET | Freq: Every day | ORAL | Status: DC
  Administered 2020-12-18 – 2020-12-21 (×4): 25 mg via ORAL
  Filled 2020-12-17 (×4): qty 1

## 2020-12-17 NOTE — Care Management Important Message (Signed)
Important Message  Patient Details  Name: David Myers MRN: [id number] Date of Birth: 11-30-1931   Medicare Important Message Given:  N/A - LOS <3 / Initial given by admissions     Dannette Barbara 12/17/2020, 2:57 PM

## 2020-12-17 NOTE — Progress Notes (Signed)
Urology Inpatient Progress Note  Subjective: No acute events overnight.  He is afebrile and borderline hypotensive this morning. WBC count down today, 8.2.  Creatinine down today, 2.05.  Urine culture pending, on antibiotics as below. Foley catheter in place draining clear, yellow urine. Today he reports feeling significantly better.  He denies abdominal or flank pain.  Anti-infectives: Anti-infectives (From admission, onward)   Start     Dose/Rate Route Frequency Ordered Stop   12/18/20 2245  levofloxacin (LEVAQUIN) IVPB 500 mg       "Followed by" Linked Group Details   500 mg 100 mL/hr over 60 Minutes Intravenous Every 48 hours 12/16/20 2147     12/17/20 0000  cefTRIAXone (ROCEPHIN) 2 g in sodium chloride 0.9 % 100 mL IVPB  Status:  Discontinued        2 g 200 mL/hr over 30 Minutes Intravenous Every 24 hours 12/16/20 2110 12/16/20 2112   12/16/20 2245  levofloxacin (LEVAQUIN) IVPB 750 mg       "Followed by" Linked Group Details   750 mg 100 mL/hr over 90 Minutes Intravenous  Once 12/16/20 2147 12/16/20 2359   12/16/20 2115  levofloxacin (LEVAQUIN) IVPB 500 mg  Status:  Discontinued        500 mg 100 mL/hr over 60 Minutes Intravenous Every 24 hours 12/16/20 2112 12/16/20 2147   12/16/20 1745  cefTRIAXone (ROCEPHIN) 1 g in sodium chloride 0.9 % 100 mL IVPB        1 g 200 mL/hr over 30 Minutes Intravenous  Once 12/16/20 1742 12/16/20 1836      Current Facility-Administered Medications  Medication Dose Route Frequency Provider Last Rate Last Admin  . 0.9 %  sodium chloride infusion   Intravenous Continuous Artist Beach, MD 75 mL/hr at 12/16/20 2228 New Bag at 12/16/20 2228  . acetaminophen (TYLENOL) tablet 650 mg  650 mg Oral Q6H PRN Acheampong, Warnell Bureau, MD       Or  . acetaminophen (TYLENOL) suppository 650 mg  650 mg Rectal Q6H PRN Acheampong, Warnell Bureau, MD      . aspirin EC tablet 81 mg  81 mg Oral Daily Acheampong, Warnell Bureau, MD      . bisacodyl (DULCOLAX) EC tablet 5 mg  5  mg Oral Daily PRN Acheampong, Warnell Bureau, MD      . Chlorhexidine Gluconate Cloth 2 % PADS 6 each  6 each Topical Daily Lorella Nimrod, MD      . cholecalciferol (VITAMIN D3) tablet 1,000 Units  1,000 Units Oral Daily Acheampong, Warnell Bureau, MD      . docusate sodium (COLACE) capsule 100 mg  100 mg Oral BID Artist Beach, MD   100 mg at 12/16/20 2230  . famotidine (PEPCID) tablet 40 mg  40 mg Oral QHS Artist Beach, MD   40 mg at 12/16/20 2230  . heparin injection 5,000 Units  5,000 Units Subcutaneous Q8H Artist Beach, MD   5,000 Units at 12/17/20 [phone]  . insulin aspart (novoLOG) injection 0-6 Units  0-6 Units Subcutaneous TID WC Acheampong, Warnell Bureau, MD      . Derrill Memo ON 12/18/2020] levofloxacin (LEVAQUIN) IVPB 500 mg  500 mg Intravenous Q48H Benita Gutter, RPH      . losartan-hydrochlorothiazide (HYZAAR) 50-12.5 MG per tablet 0.5 tablet  0.5 tablet Oral Daily Acheampong, Warnell Bureau, MD      . Netarsudil-Latanoprost 0.02-0.005 % SOLN 1 drop  1 drop Ophthalmic Daily Acheampong, Warnell Bureau, MD      .  ondansetron (ZOFRAN) tablet 4 mg  4 mg Oral Q6H PRN Acheampong, Warnell Bureau, MD       Or  . ondansetron (ZOFRAN) injection 4 mg  4 mg Intravenous Q6H PRN Acheampong, Warnell Bureau, MD      . pantoprazole (PROTONIX) EC tablet 40 mg  40 mg Oral QAC breakfast Acheampong, Warnell Bureau, MD   40 mg at 12/17/20 0905  . simvastatin (ZOCOR) tablet 20 mg  20 mg Oral QHS Acheampong, Warnell Bureau, MD   20 mg at 12/16/20 2229  . tamsulosin (FLOMAX) capsule 0.4 mg  0.4 mg Oral Daily Acheampong, Warnell Bureau, MD   0.4 mg at 12/16/20 2237   Objective: Vital signs in last 24 hours: Temp:  [97.5 F (36.4 C)-98.5 F (36.9 C)] 98.1 F (36.7 C) (05/19 0833) Pulse Rate:  [37-88] 59 (05/19 0833) Resp:  [16-30] 16 (05/19 0833) BP: (92-133)/(58-73) 102/62 (05/19 0833) SpO2:  [92 %-97 %] 94 % (05/19 0833) Weight:  [70.3 kg-72.4 kg] 72.4 kg (05/18 2200)  Intake/Output from previous day: 05/18 0701 - 05/19 0700 In: 450 [IV  Piggyback:450] Out: 450 [Urine:450] Intake/Output this shift: No intake/output data recorded.  Physical Exam Vitals and nursing note reviewed.  Constitutional:      General: He is not in acute distress.    Appearance: He is ill-appearing. He is not toxic-appearing or diaphoretic.  HENT:     Head: Normocephalic and atraumatic.  Pulmonary:     Effort: Pulmonary effort is normal. No respiratory distress.  Skin:    General: Skin is warm and dry.  Neurological:     Mental Status: He is alert and oriented to person, place, and time.  Psychiatric:        Mood and Affect: Mood normal.        Behavior: Behavior normal.    Lab Results:  Recent Labs    12/16/20 1639 12/17/20 0528  WBC 11.6* 8.2  HGB 12.6* 11.1*  HCT 36.1* 32.5*  PLT 308 262   BMET Recent Labs    12/16/20 1639 12/17/20 0528  NA 131* 134*  K 4.6 4.6  CL 95* 101  CO2 23 23  GLUCOSE 143* 203*  BUN 32* 31*  CREATININE 2.58* 2.05*  CALCIUM 9.3 8.6*   Assessment & Plan: 87 year old male POD 1 from left ureteral stent placement for management of obstructing distal left ureteral stones with febrile UTI.  Patient clinically improving on empiric antibiotics.  He is tolerating his stent well today.  Okay to discontinue Foley catheter since he has been fever free for 24 hours.  We discussed that he will require outpatient ureteroscopy with laser lithotripsy and stent exchange in 2 to 3 weeks.  He expressed understanding.  Recommendations: -Discontinue Foley catheter -Continue empiric antibiotics and follow urine culture.  He will require a total of 10 to 14 days of culture appropriate therapy -Outpatient ureteroscopy with laser lithotripsy and stent exchange in 2 to 3 weeks  Debroah Loop, PA-C 12/17/2020

## 2020-12-17 NOTE — Progress Notes (Signed)
PROGRESS NOTE    David Myers  KNL:[id number] DOB: 25-Sep-1931 DOA: 12/16/2020 PCP: Rusty Aus, MD   Brief Narrative: Taken from H&P. David Myers is an 87 y.o. male who is status post TURP in 10/2020 by Dr Bernardo Heater.  Other comorbidities include diabetes mellitus, kidney stones, history of stroke,obstructive sleep apnea not compliant with CPAP.  As per wife, post TURP, patient has been having recurrent UTI/prostatitis and has been on and off antibiotics for several weeks.  He was last on levofloxacin for treatment of recurrent UTI but continued to have persistent fever with associated lower abdominal discomfort.  He went back to see urology today, urinalysis was obtained in the office and was suggestive of UTI.  Stat CT scan was requested by urology and result came back suggesting new left hydronephrosis with obstructive nephrolithiasis s/p cystoscopy and stent placement. Repeat urine cultures pending.  Patient was started on levofloxacin based on prior urine cultures.  Subjective: Patient is feeling much improved after his procedure when seen today.  Pain has been improved and he was feeling close to his baseline.  Wife at bedside.  Assessment & Plan:   Active Problems:   Pyelonephritis  Acute left pyelonephritis.  CT abdomen with evidence of obstructive nephrolithiasis s/p cystoscopy and stent placement by urology.  Patient will need outpatient follow-up with urology for lithotripsy and further management.  History of multiple drug-resistant UTI only sensitive to levofloxacin on prior culture results.  Patient did received 1 week of levo but continued to experience fever.  Remained afebrile with improvement in leukocytosis since admission. Urine culture results pending. -Continue with levofloxacin per pharmacy-patient is on every 48 hourly dose according to her renal function. -Follow-up urine culture. -Remove Foley catheter as advised by urology.  AKI with CKD stage IV.   Most likely secondary to obstruction.  Creatinine started improving. -Monitor renal function -Avoid nephrotoxins  Hypertension.  Blood pressure within goal. -Continue home meds  Objective: Vitals:   12/17/20 0923 12/17/20 1020 12/17/20 1134 12/17/20 1238  BP:   94/61   Pulse: 93 90 71 88  Resp:   16   Temp:   98.2 F (36.8 C)   TempSrc:      SpO2: 97% 94% 97% 97%  Weight:      Height:        Intake/Output Summary (Last 24 hours) at 12/17/2020 1524 Last data filed at 12/17/2020 0433 Gross per 24 hour  Intake 450 ml  Output 450 ml  Net 0 ml   Filed Weights   12/16/20 2200  Weight: 72.4 kg    Examination:  General exam: Appears calm and comfortable  Respiratory system: Clear to auscultation. Respiratory effort normal. Cardiovascular system: S1 & S2 heard, RRR. No JVD, murmurs, rubs, gallops or clicks. Gastrointestinal system: Soft, nontender, nondistended, bowel sounds positive. Central nervous system: Alert and oriented. No focal neurological deficits. Extremities: No edema, no cyanosis, pulses intact and symmetrical. Psychiatry: Judgement and insight appear normal. Mood & affect appropriate.    DVT prophylaxis: Heparin Code Status: Full Family Communication: Wife was updated at bed site. Disposition Plan:  Status is: Inpatient  Remains inpatient appropriate because:Inpatient level of care appropriate due to severity of illness   Dispo: The patient is from: Home              Anticipated d/c is to: Home              Patient currently is not medically stable to d/c.  Difficult to place patient No             Level of care: Med-Surg  All the records are reviewed and case discussed with Care Management/Social Worker. Management plans discussed with the patient, nursing and they are in agreement.  Consultants:   Urology  Procedures:  Cystoscopy with urethral stent placement  Antimicrobials:  Levofloxacin  Data Reviewed: I have personally reviewed  following labs and imaging studies  CBC: Recent Labs  Lab 12/16/20 1533 12/16/20 1639 12/17/20 0528  WBC 11.9* 11.6* 8.2  NEUTROABS 10.1*  --   --   HGB 11.5* 12.6* 11.1*  HCT 33.6* 36.1* 32.5*  MCV 92.3 91.2 92.1  PLT 284 308 284   Basic Metabolic Panel: Recent Labs  Lab 12/16/20 1533 12/16/20 1639 12/17/20 0528  NA 130* 131* 134*  K 4.6 4.6 4.6  CL 94* 95* 101  CO2 25 23 23   GLUCOSE 153* 143* 203*  BUN 33* 32* 31*  CREATININE 2.71* 2.58* 2.05*  CALCIUM 8.7* 9.3 8.6*   GFR: Estimated Creatinine Clearance: 23.7 mL/min (A) (by C-G formula based on SCr of 2.05 mg/dL (H)). Liver Function Tests: No results for input(s): AST, ALT, ALKPHOS, BILITOT, PROT, ALBUMIN in the last 168 hours. No results for input(s): LIPASE, AMYLASE in the last 168 hours. No results for input(s): AMMONIA in the last 168 hours. Coagulation Profile: No results for input(s): INR, PROTIME in the last 168 hours. Cardiac Enzymes: No results for input(s): CKTOTAL, CKMB, CKMBINDEX, TROPONINI in the last 168 hours. BNP (last 3 results) No results for input(s): PROBNP in the last 8760 hours. HbA1C: Recent Labs    12/17/20 0528  HGBA1C 7.8*   CBG: Recent Labs  Lab 12/16/20 2213 12/17/20 0828 12/17/20 1136  GLUCAP 158* 174* 236*   Lipid Profile: No results for input(s): CHOL, HDL, LDLCALC, TRIG, CHOLHDL, LDLDIRECT in the last 72 hours. Thyroid Function Tests: No results for input(s): TSH, T4TOTAL, FREET4, T3FREE, THYROIDAB in the last 72 hours. Anemia Panel: No results for input(s): VITAMINB12, FOLATE, FERRITIN, TIBC, IRON, RETICCTPCT in the last 72 hours. Sepsis Labs: Recent Labs  Lab 12/16/20 1533 12/16/20 1639  LATICACIDVEN 1.2 1.5    Recent Results (from the past 240 hour(s))  CULTURE, URINE COMPREHENSIVE     Status: Abnormal (Preliminary result)   Collection Time: 12/08/20  8:49 AM   Specimen: Urine   UR  Result Value Ref Range Status   Urine Culture, Comprehensive Preliminary  report (A)  Preliminary   Organism ID, Bacteria Comment (A)  Preliminary    Comment: Stenotrophomonas maltophilia 1,000 Colonies/mL   Microscopic Examination     Status: Abnormal   Collection Time: 12/08/20  8:49 AM   Urine  Result Value Ref Range Status   WBC, UA >30 (A) 0 - 5 /hpf Final   RBC 11-30 (A) 0 - 2 /hpf Final   Epithelial Cells (non renal) 0-10 0 - 10 /hpf Final   Bacteria, UA Few None seen/Few Final  Blood culture (routine x 2)     Status: None (Preliminary result)   Collection Time: 12/16/20  4:39 PM   Specimen: BLOOD  Result Value Ref Range Status   Specimen Description BLOOD RIGHT ANTECUBITAL  Final   Special Requests   Final    BOTTLES DRAWN AEROBIC AND ANAEROBIC Blood Culture adequate volume   Culture   Final    NO GROWTH < 24 HOURS Performed at [facility], [address] [place], [place] [address]  Report Status PENDING  Incomplete  Resp Panel by RT-PCR (Flu A&B, Covid) Nasopharyngeal Swab     Status: None   Collection Time: 12/16/20  6:05 PM   Specimen: Nasopharyngeal Swab; Nasopharyngeal(NP) swabs in vial transport medium  Result Value Ref Range Status   SARS Coronavirus 2 by RT PCR NEGATIVE NEGATIVE Final    Comment: (NOTE) SARS-CoV-2 target nucleic acids are NOT DETECTED.  The SARS-CoV-2 RNA is generally detectable in upper respiratory specimens during the acute phase of infection. The lowest concentration of SARS-CoV-2 viral copies this assay can detect is 138 copies/mL. A negative result does not preclude SARS-Cov-2 infection and should not be used as the sole basis for treatment or other patient management decisions. A negative result may occur with  improper specimen collection/handling, submission of specimen other than nasopharyngeal swab, presence of viral mutation(s) within the areas targeted by this assay, and inadequate number of viral copies(<138 copies/mL). A negative result must be combined with clinical observations,  patient history, and epidemiological information. The expected result is Negative.  Fact Sheet for Patients:  EntrepreneurPulse.com.au  Fact Sheet for Healthcare Providers:  IncredibleEmployment.be  This test is no t yet approved or cleared by the [place] FDA and  has been authorized for detection and/or diagnosis of SARS-CoV-2 by FDA under an Emergency Use Authorization (EUA). This EUA will remain  in effect (meaning this test can be used) for the duration of the COVID-19 declaration under Section 564(b)(1) of the Act, 21 U.S.C.section 360bbb-3(b)(1), unless the authorization is terminated  or revoked sooner.       Influenza A by PCR NEGATIVE NEGATIVE Final   Influenza B by PCR NEGATIVE NEGATIVE Final    Comment: (NOTE) The Xpert Xpress SARS-CoV-2/FLU/RSV plus assay is intended as an aid in the diagnosis of influenza from Nasopharyngeal swab specimens and should not be used as a sole basis for treatment. Nasal washings and aspirates are unacceptable for Xpert Xpress SARS-CoV-2/FLU/RSV testing.  Fact Sheet for Patients: EntrepreneurPulse.com.au  Fact Sheet for Healthcare Providers: IncredibleEmployment.be  This test is not yet approved or cleared by the [place] FDA and has been authorized for detection and/or diagnosis of SARS-CoV-2 by FDA under an Emergency Use Authorization (EUA). This EUA will remain in effect (meaning this test can be used) for the duration of the COVID-19 declaration under Section 564(b)(1) of the Act, 21 U.S.C. section 360bbb-3(b)(1), unless the authorization is terminated or revoked.  Performed at [facility], [place], [place] [address]   Anaerobic culture w Gram Stain     Status: None (Preliminary result)   Collection Time: 12/16/20  7:37 PM   Specimen: Urine, Cystoscope  Result Value Ref Range Status   Specimen Description   Final     URINE, CATHETERIZED LEFT RENAL PELVIS Performed at [facility], [place] [address] [place], [place] [address]    Special Requests   Final    NONE Performed at [facility], [place], [place] [address]    Gram Stain   Final    FEW WBC PRESENT, PREDOMINANTLY PMN MODERATE GRAM NEGATIVE RODS Performed at [facility], [place] [address], [place],  [address]    Culture PENDING  Incomplete   Report Status PENDING  Incomplete  Urine culture     Status: None (Preliminary result)   Collection Time: 12/16/20  7:56 PM   Specimen: Urine, Catheterized  Result Value Ref Range Status   Specimen Description   Final  URINE, CATHETERIZED LEFT RENAL PELVIS Performed at [facility], [place] [address] [place], [place] [address]    Special Requests   Final    NONE Performed at [facility], [place] [place], [place] [address]    Culture PENDING  Incomplete   Report Status PENDING  Incomplete     Radiology Studies: DG OR UROLOGY CYSTO IMAGE ([place])  Result Date: 12/16/2020 There is no interpretation for this exam.  This order is for images obtained during a surgical procedure.  Please See "Surgeries" Tab for more information regarding the procedure.   CT RENAL STONE STUDY  Result Date: 12/16/2020 CLINICAL DATA:  Left flank pain. Hematuria. Fever and dysuria. Recent TURP procedure. EXAM: CT ABDOMEN AND PELVIS WITHOUT CONTRAST TECHNIQUE: Multidetector CT imaging of the abdomen and pelvis was performed following the standard protocol without IV contrast. COMPARISON:  02/12/2014 FINDINGS: Lower chest: No acute findings. Chronic interstitial lung disease noted in both lung bases. Hepatobiliary: No mass visualized on this unenhanced exam. Gallbladder is unremarkable. No evidence of biliary ductal dilatation. Pancreas: No mass or inflammatory process visualized on this unenhanced exam. Spleen:  Within normal limits in size. Adrenals/Urinary  tract: A few renal calculi are seen bilaterally, largest in lower pole of left kidney measuring 10 mm. New moderate left hydroureteronephrosis is seen, due to 2 adjacent calculi in the distal left ureter measuring 7 mm and 4 mm. Diffuse bladder wall thickening and trabeculation again seen, consistent with chronic bladder outlet obstruction. Stomach/Bowel: Increased size of moderate hiatal hernia noted. No evidence of obstruction, inflammatory process, or abnormal fluid collections. Normal appendix visualized. Diverticulosis is seen mainly involving the sigmoid colon, however there is no evidence of diverticulitis. Vascular/Lymphatic: No pathologically enlarged lymph nodes identified. No evidence of abdominal aortic aneurysm. Aortic atherosclerotic calcification noted. Reproductive: Mildly enlarged prostate gland again noted TURP defect which is new since previous study. Other:  Stable small left inguinal hernia containing only fat. Musculoskeletal:  No suspicious bone lesions identified. IMPRESSION: New moderate left hydroureteronephrosis due to 2 adjacent calculi in distal left ureter measuring 7 mm and 4 mm. Bilateral nephrolithiasis. Mildly enlarged prostate gland and findings of chronic bladder outlet obstruction. New TURP defect noted. Increased size of moderate hiatal hernia. Colonic diverticulosis. No radiographic evidence of diverticulitis. Aortic Atherosclerosis (ICD10-I70.0). Electronically Signed   By: Marlaine Hind M.D.   On: 12/16/2020 16:21    Scheduled Meds: . aspirin EC  81 mg Oral Daily  . Chlorhexidine Gluconate Cloth  6 each Topical Daily  . cholecalciferol  1,000 Units Oral Daily  . docusate sodium  100 mg Oral BID  . famotidine  20 mg Oral QHS  . heparin  5,000 Units Subcutaneous Q8H  . [START ON 12/18/2020] losartan  25 mg Oral Daily   And  . [START ON 12/18/2020] hydrochlorothiazide  6.25 mg Oral Daily  . insulin aspart  0-6 Units Subcutaneous TID WC  . Netarsudil-Latanoprost  1  drop Ophthalmic Daily  . pantoprazole  40 mg Oral QAC breakfast  . simvastatin  20 mg Oral QHS  . tamsulosin  0.4 mg Oral Daily   Continuous Infusions: . [START ON 12/18/2020] levofloxacin (LEVAQUIN) IV       LOS: 1 day   Time spent: 36 minutes. More than 50% of the time was spent in counseling/coordination of care  Lorella Nimrod, MD [facility]  If 7PM-7AM, please contact night-coverage [email or web address]  12/17/2020, 3:24 PM   This record has been created using [employer]  voice recognition software. Errors have been sought and corrected,but may not always be located. Such creation errors do not reflect on the standard of care.

## 2020-12-18 ENCOUNTER — Encounter: Payer: Self-pay | Admitting: Internal Medicine

## 2020-12-18 DIAGNOSIS — N179 Acute kidney failure, unspecified: Secondary | ICD-10-CM | POA: Diagnosis not present

## 2020-12-18 DIAGNOSIS — N12 Tubulo-interstitial nephritis, not specified as acute or chronic: Secondary | ICD-10-CM | POA: Diagnosis not present

## 2020-12-18 DIAGNOSIS — N3001 Acute cystitis with hematuria: Secondary | ICD-10-CM | POA: Diagnosis not present

## 2020-12-18 DIAGNOSIS — N200 Calculus of kidney: Secondary | ICD-10-CM | POA: Diagnosis not present

## 2020-12-18 LAB — URINE CULTURE: Culture: NO GROWTH

## 2020-12-18 LAB — URINALYSIS, COMPLETE
Bilirubin, UA: NEGATIVE
Glucose, UA: NEGATIVE
Ketones, UA: NEGATIVE
Nitrite, UA: NEGATIVE
Specific Gravity, UA: 1.015 (ref 1.005–1.030)
Urobilinogen, Ur: 0.2 mg/dL (ref 0.2–1.0)
pH, UA: 6.5 (ref 5.0–7.5)

## 2020-12-18 LAB — BASIC METABOLIC PANEL
Anion gap: 10 (ref 5–15)
BUN: 33 mg/dL — ABNORMAL HIGH (ref 8–23)
CO2: 25 mmol/L (ref 22–32)
Calcium: 9 mg/dL (ref 8.9–10.3)
Chloride: 102 mmol/L (ref 98–111)
Creatinine, Ser: 1.8 mg/dL — ABNORMAL HIGH (ref 0.61–1.24)
GFR, Estimated: 36 mL/min — ABNORMAL LOW (ref >60)
Glucose, Bld: 123 mg/dL — ABNORMAL HIGH (ref 70–99)
Potassium: 4.1 mmol/L (ref 3.5–5.1)
Sodium: 137 mmol/L (ref 135–145)

## 2020-12-18 LAB — GLUCOSE, CAPILLARY
Glucose-Capillary: 246 mg/dL — ABNORMAL HIGH (ref 70–99)
Glucose-Capillary: 169 mg/dL — ABNORMAL HIGH (ref 70–99)
Glucose-Capillary: 164 mg/dL — ABNORMAL HIGH (ref 70–99)
Glucose-Capillary: 122 mg/dL — ABNORMAL HIGH (ref 70–99)
Glucose-Capillary: 191 mg/dL — ABNORMAL HIGH (ref 70–99)

## 2020-12-18 LAB — MICROSCOPIC EXAMINATION
Bacteria, UA: NONE SEEN
RBC, Urine: >30 /hpf — ABNORMAL HIGH (ref 0–2)

## 2020-12-18 LAB — CBC
HCT: 30.8 % — ABNORMAL LOW (ref 39.0–52.0)
Hemoglobin: 10.7 g/dL — ABNORMAL LOW (ref 13.0–17.0)
MCH: 31.8 pg (ref 26.0–34.0)
MCHC: 34.7 g/dL (ref 30.0–36.0)
MCV: 91.4 fL (ref 80.0–100.0)
Platelets: 276 10*3/uL (ref 150–400)
RBC: 3.37 MIL/uL — ABNORMAL LOW (ref 4.22–5.81)
RDW: 12.8 % (ref 11.5–15.5)
WBC: 9.1 10*3/uL (ref 4.0–10.5)
nRBC: 0 % (ref 0.0–0.2)

## 2020-12-18 MED ORDER — SODIUM CHLORIDE 0.9 % IV SOLN
INTRAVENOUS | Status: DC | PRN
  Administered 2020-12-18 – 2020-12-26 (×3): 250 mL via INTRAVENOUS

## 2020-12-18 NOTE — Progress Notes (Signed)
PROGRESS NOTE    David Myers  DUK:[id number] DOB: 12/16/1931 DOA: 12/16/2020 PCP: Rusty Aus, MD   Brief Narrative: Taken from H&P. David Myers is an 87 y.o. male who is status post TURP in 10/2020 by Dr Bernardo Heater.  Other comorbidities include diabetes mellitus, kidney stones, history of stroke,obstructive sleep apnea not compliant with CPAP.  As per wife, post TURP, patient has been having recurrent UTI/prostatitis and has been on and off antibiotics for several weeks.  He was last on levofloxacin for treatment of recurrent UTI but continued to have persistent fever with associated lower abdominal discomfort.  He went back to see urology today, urinalysis was obtained in the office and was suggestive of UTI.  Stat CT scan was requested by urology and result came back suggesting new left hydronephrosis with obstructive nephrolithiasis s/p cystoscopy and stent placement. Repeat urine cultures pending.  Patient was started on levofloxacin based on prior urine cultures.  Subjective: Patient denies any complaints.  Wants to go home.  Wife at bedside.  Still waiting for final culture results.  Assessment & Plan:   Active Problems:   Kidney stone   Pyelonephritis   Acute cystitis with hematuria   AKI (acute kidney injury) ([place])  Acute left pyelonephritis.  CT abdomen with evidence of obstructive nephrolithiasis s/p cystoscopy and stent placement by urology.  Patient will need outpatient follow-up with urology for lithotripsy and further management.  History of multiple drug-resistant UTI only sensitive to levofloxacin on prior culture results.  Patient did received 1 week of levo but continued to experience fever.  Remained afebrile with improvement in leukocytosis since admission. Urine culture results pending, growing stenotrophomonas maltophilia-pending susceptibility -Continue with levofloxacin per pharmacy-patient is on every 48 hourly dose according to her renal  function. -Follow-up urine culture for final recommendations of antibiotics-he will need 2 weeks of antibiotics per urology. -Remove Foley catheter as advised by urology.  AKI with CKD stage IV.  Most likely secondary to obstruction.  Creatinine started improving. -Monitor renal function -Avoid nephrotoxins  Hypertension.  Blood pressure within goal. -Continue home meds  Objective: Vitals:   12/18/20 0059 12/18/20 0417 12/18/20 0753 12/18/20 1149  BP: 114/62 (!) 102/57 112/81 (!) 94/55  Pulse: (!) 51 (!) 55 (!) 54 71  Resp:  17 16 16   Temp: 98 F (36.7 C) 97.8 F (36.6 C) 97.7 F (36.5 C) 97.9 F (36.6 C)  TempSrc:      SpO2: 91% 99% 94% 97%  Weight:      Height:        Intake/Output Summary (Last 24 hours) at 12/18/2020 1410 Last data filed at 12/18/2020 0900 Gross per 24 hour  Intake --  Output 2790 ml  Net -2790 ml   Filed Weights   12/16/20 2200  Weight: 72.4 kg    Examination:  General.  Well-developed elderly man, in no acute distress. Pulmonary.  Lungs clear bilaterally, normal respiratory effort. CV.  Regular rate and rhythm, no JVD, rub or murmur. Abdomen.  Soft, nontender, nondistended, BS positive. CNS.  Alert and oriented x3.  No focal neurologic deficit. Extremities.  No edema, no cyanosis, pulses intact and symmetrical. Psychiatry.  Judgment and insight appears normal.  DVT prophylaxis: Heparin Code Status: Full Family Communication: Wife was updated at bed site. Disposition Plan:  Status is: Inpatient  Remains inpatient appropriate because:Inpatient level of care appropriate due to severity of illness   Dispo: The patient is from: Home  Anticipated d/c is to: Home              Patient currently is not medically stable to d/c.   Difficult to place patient No             Level of care: Med-Surg  All the records are reviewed and case discussed with Care Management/Social Worker. Management plans discussed with the patient,  nursing and they are in agreement.  Consultants:   Urology  Procedures:  Cystoscopy with urethral stent placement  Antimicrobials:  Levofloxacin  Data Reviewed: I have personally reviewed following labs and imaging studies  CBC: Recent Labs  Lab 12/16/20 1533 12/16/20 1639 12/17/20 0528 12/18/20 0439  WBC 11.9* 11.6* 8.2 9.1  NEUTROABS 10.1*  --   --   --   HGB 11.5* 12.6* 11.1* 10.7*  HCT 33.6* 36.1* 32.5* 30.8*  MCV 92.3 91.2 92.1 91.4  PLT 284 308 262 845   Basic Metabolic Panel: Recent Labs  Lab 12/16/20 1533 12/16/20 1639 12/17/20 0528 12/18/20 0439  NA 130* 131* 134* 137  K 4.6 4.6 4.6 4.1  CL 94* 95* 101 102  CO2 25 23 23 25   GLUCOSE 153* 143* 203* 123*  BUN 33* 32* 31* 33*  CREATININE 2.71* 2.58* 2.05* 1.80*  CALCIUM 8.7* 9.3 8.6* 9.0   GFR: Estimated Creatinine Clearance: 27 mL/min (A) (by C-G formula based on SCr of 1.8 mg/dL (H)). Liver Function Tests: No results for input(s): AST, ALT, ALKPHOS, BILITOT, PROT, ALBUMIN in the last 168 hours. No results for input(s): LIPASE, AMYLASE in the last 168 hours. No results for input(s): AMMONIA in the last 168 hours. Coagulation Profile: No results for input(s): INR, PROTIME in the last 168 hours. Cardiac Enzymes: No results for input(s): CKTOTAL, CKMB, CKMBINDEX, TROPONINI in the last 168 hours. BNP (last 3 results) No results for input(s): PROBNP in the last 8760 hours. HbA1C: Recent Labs    12/17/20 0528  HGBA1C 7.8*   CBG: Recent Labs  Lab 12/17/20 1539 12/17/20 1807 12/17/20 2131 12/18/20 0753 12/18/20 1149  GLUCAP 246* 169* 164* 122* 191*   Lipid Profile: No results for input(s): CHOL, HDL, LDLCALC, TRIG, CHOLHDL, LDLDIRECT in the last 72 hours. Thyroid Function Tests: No results for input(s): TSH, T4TOTAL, FREET4, T3FREE, THYROIDAB in the last 72 hours. Anemia Panel: No results for input(s): VITAMINB12, FOLATE, FERRITIN, TIBC, IRON, RETICCTPCT in the last 72 hours. Sepsis  Labs: Recent Labs  Lab 12/16/20 1533 12/16/20 1639  LATICACIDVEN 1.2 1.5    Recent Results (from the past 240 hour(s))  Microscopic Examination     Status: Abnormal   Collection Time: 12/16/20  2:35 PM   Urine  Result Value Ref Range Status   WBC, UA 11-30 (A) 0 - 5 /hpf Final   RBC >30 (H) 0 - 2 /hpf Final   Epithelial Cells (non renal) 0-10 0 - 10 /hpf Final   Casts Present (A) None seen /lpf Final   Cast Type Hyaline casts N/A Final   Bacteria, UA None seen None seen/Few Final  CULTURE, URINE COMPREHENSIVE     Status: None (Preliminary result)   Collection Time: 12/16/20  4:29 PM   Specimen: Urine   UR  Result Value Ref Range Status   Urine Culture, Comprehensive Preliminary report  Preliminary   Organism ID, Bacteria Comment  Preliminary    Comment: Microbiological testing to rule out the presence of possible pathogens is in progress.   Blood culture (routine x 2)  Status: None (Preliminary result)   Collection Time: 12/16/20  4:39 PM   Specimen: BLOOD  Result Value Ref Range Status   Specimen Description BLOOD RIGHT ANTECUBITAL  Final   Special Requests   Final    BOTTLES DRAWN AEROBIC AND ANAEROBIC Blood Culture adequate volume   Culture   Final    NO GROWTH 2 DAYS Performed at [facility], [address], [place], [place] [address]    Report Status PENDING  Incomplete  Resp Panel by RT-PCR (Flu A&B, Covid) Nasopharyngeal Swab     Status: None   Collection Time: 12/16/20  6:05 PM   Specimen: Nasopharyngeal Swab; Nasopharyngeal(NP) swabs in vial transport medium  Result Value Ref Range Status   SARS Coronavirus 2 by RT PCR NEGATIVE NEGATIVE Final    Comment: (NOTE) SARS-CoV-2 target nucleic acids are NOT DETECTED.  The SARS-CoV-2 RNA is generally detectable in upper respiratory specimens during the acute phase of infection. The lowest concentration of SARS-CoV-2 viral copies this assay can detect is 138 copies/mL. A negative result does not  preclude SARS-Cov-2 infection and should not be used as the sole basis for treatment or other patient management decisions. A negative result may occur with  improper specimen collection/handling, submission of specimen other than nasopharyngeal swab, presence of viral mutation(s) within the areas targeted by this assay, and inadequate number of viral copies(<138 copies/mL). A negative result must be combined with clinical observations, patient history, and epidemiological information. The expected result is Negative.  Fact Sheet for Patients:  EntrepreneurPulse.com.au  Fact Sheet for Healthcare Providers:  IncredibleEmployment.be  This test is no t yet approved or cleared by the [place] FDA and  has been authorized for detection and/or diagnosis of SARS-CoV-2 by FDA under an Emergency Use Authorization (EUA). This EUA will remain  in effect (meaning this test can be used) for the duration of the COVID-19 declaration under Section 564(b)(1) of the Act, 21 U.S.C.section 360bbb-3(b)(1), unless the authorization is terminated  or revoked sooner.       Influenza A by PCR NEGATIVE NEGATIVE Final   Influenza B by PCR NEGATIVE NEGATIVE Final    Comment: (NOTE) The Xpert Xpress SARS-CoV-2/FLU/RSV plus assay is intended as an aid in the diagnosis of influenza from Nasopharyngeal swab specimens and should not be used as a sole basis for treatment. Nasal washings and aspirates are unacceptable for Xpert Xpress SARS-CoV-2/FLU/RSV testing.  Fact Sheet for Patients: EntrepreneurPulse.com.au  Fact Sheet for Healthcare Providers: IncredibleEmployment.be  This test is not yet approved or cleared by the [place] FDA and has been authorized for detection and/or diagnosis of SARS-CoV-2 by FDA under an Emergency Use Authorization (EUA). This EUA will remain in effect (meaning this test can be used) for the  duration of the COVID-19 declaration under Section 564(b)(1) of the Act, 21 U.S.C. section 360bbb-3(b)(1), unless the authorization is terminated or revoked.  Performed at [facility], [address], [place], [place] [address]   Urine Culture     Status: None   Collection Time: 12/16/20  6:05 PM   Specimen: Urine, Random  Result Value Ref Range Status   Specimen Description   Final    URINE, RANDOM Performed at [facility], [address] [place], [place] [address]    Special Requests   Final    NONE Performed at [facility], [address], [place],  [address]    Culture   Final    NO GROWTH Performed at [facility], [place] [address], [place], [place] [address]    Report Status 12/18/2020 FINAL  Final  Anaerobic culture w Gram Stain     Status: None (Preliminary result)   Collection Time: 12/16/20  7:37 PM   Specimen: Urine, Cystoscope  Result Value Ref Range Status   Specimen Description   Final    URINE, CATHETERIZED LEFT RENAL PELVIS Performed at [facility], [place] [address], [place], [place] [address]    Special Requests   Final    NONE Performed at [facility], [place], [place] [address]    Gram Stain   Final    FEW WBC PRESENT, PREDOMINANTLY PMN MODERATE GRAM NEGATIVE RODS Performed at [facility], [place] [address] [place] [address] [place], [place] [address]    Culture PENDING  Incomplete   Report Status PENDING  Incomplete  Urine culture     Status: Abnormal (Preliminary result)   Collection Time: 12/16/20  7:56 PM   Specimen: Urine, Catheterized  Result Value Ref Range Status   Specimen Description   Final    URINE, CATHETERIZED LEFT RENAL PELVIS Performed at [facility], [place] [address], [place], [place] [address]    Special Requests   Final    NONE Performed at [facility], [place] [place], [place] [address]    Culture (A)  Final    >=100,000 COLONIES/mL  STENOTROPHOMONAS MALTOPHILIA SUSCEPTIBILITIES TO FOLLOW Performed at [facility], [place] [address], [place], [place] [address]    Report Status PENDING  Incomplete     Radiology Studies: DG OR UROLOGY CYSTO IMAGE ([place])  Result Date: 12/16/2020 There is no interpretation for this exam.  This order is for images obtained during a surgical procedure.  Please See "Surgeries" Tab for more information regarding the procedure.   CT RENAL STONE STUDY  Result Date: 12/16/2020 CLINICAL DATA:  Left flank pain. Hematuria. Fever and dysuria. Recent TURP procedure. EXAM: CT ABDOMEN AND PELVIS WITHOUT CONTRAST TECHNIQUE: Multidetector CT imaging of the abdomen and pelvis was performed following the standard protocol without IV contrast. COMPARISON:  02/12/2014 FINDINGS: Lower chest: No acute findings. Chronic interstitial lung disease noted in both lung bases. Hepatobiliary: No mass visualized on this unenhanced exam. Gallbladder is unremarkable. No evidence of biliary ductal dilatation. Pancreas: No mass or inflammatory process visualized on this unenhanced exam. Spleen:  Within normal limits in size. Adrenals/Urinary tract: A few renal calculi are seen bilaterally, largest in lower pole of left kidney measuring 10 mm. New moderate left hydroureteronephrosis is seen, due to 2 adjacent calculi in the distal left ureter measuring 7 mm and 4 mm. Diffuse bladder wall thickening and trabeculation again seen, consistent with chronic bladder outlet obstruction. Stomach/Bowel: Increased size of moderate hiatal hernia noted. No evidence of obstruction, inflammatory process, or abnormal fluid collections. Normal appendix visualized. Diverticulosis is seen mainly involving the sigmoid colon, however there is no evidence of diverticulitis. Vascular/Lymphatic: No pathologically enlarged lymph nodes identified. No evidence of abdominal aortic aneurysm. Aortic atherosclerotic calcification noted. Reproductive: Mildly  enlarged prostate gland again noted TURP defect which is new since previous study. Other:  Stable small left inguinal hernia containing only fat. Musculoskeletal:  No suspicious bone lesions identified. IMPRESSION: New moderate left hydroureteronephrosis due to 2 adjacent calculi in distal left ureter measuring 7 mm and 4 mm. Bilateral nephrolithiasis. Mildly enlarged prostate gland and findings of chronic bladder outlet obstruction. New TURP defect noted. Increased size of  moderate hiatal hernia. Colonic diverticulosis. No radiographic evidence of diverticulitis. Aortic Atherosclerosis (ICD10-I70.0). Electronically Signed   By: Marlaine Hind M.D.   On: 12/16/2020 16:21    Scheduled Meds: . aspirin EC  81 mg Oral Daily  . Chlorhexidine Gluconate Cloth  6 each Topical Daily  . cholecalciferol  1,000 Units Oral Daily  . docusate sodium  100 mg Oral BID  . famotidine  20 mg Oral QHS  . heparin  5,000 Units Subcutaneous Q8H  . losartan  25 mg Oral Daily   And  . hydrochlorothiazide  6.25 mg Oral Daily  . insulin aspart  0-6 Units Subcutaneous TID WC  . Netarsudil-Latanoprost  1 drop Ophthalmic Daily  . pantoprazole  40 mg Oral QAC breakfast  . simvastatin  20 mg Oral QHS  . tamsulosin  0.4 mg Oral Daily   Continuous Infusions: . levofloxacin (LEVAQUIN) IV       LOS: 2 days   Time spent: 30 minutes. More than 50% of the time was spent in counseling/coordination of care  Lorella Nimrod, MD [facility]  If 7PM-7AM, please contact night-coverage [email or web address]  12/18/2020, 2:10 PM   This record has been created using Systems analyst. Errors have been sought and corrected,but may not always be located. Such creation errors do not reflect on the standard of care.

## 2020-12-19 DIAGNOSIS — N12 Tubulo-interstitial nephritis, not specified as acute or chronic: Secondary | ICD-10-CM | POA: Diagnosis not present

## 2020-12-19 DIAGNOSIS — N200 Calculus of kidney: Secondary | ICD-10-CM | POA: Diagnosis not present

## 2020-12-19 DIAGNOSIS — N179 Acute kidney failure, unspecified: Secondary | ICD-10-CM | POA: Diagnosis not present

## 2020-12-19 DIAGNOSIS — N3001 Acute cystitis with hematuria: Secondary | ICD-10-CM | POA: Diagnosis not present

## 2020-12-19 LAB — CBC
HCT: 33.7 % — ABNORMAL LOW (ref 39.0–52.0)
Hemoglobin: 11.7 g/dL — ABNORMAL LOW (ref 13.0–17.0)
MCH: 31.4 pg (ref 26.0–34.0)
MCHC: 34.7 g/dL (ref 30.0–36.0)
MCV: 90.3 fL (ref 80.0–100.0)
Platelets: 288 10*3/uL (ref 150–400)
RBC: 3.73 MIL/uL — ABNORMAL LOW (ref 4.22–5.81)
RDW: 12.6 % (ref 11.5–15.5)
WBC: 9.3 10*3/uL (ref 4.0–10.5)
nRBC: 0 % (ref 0.0–0.2)

## 2020-12-19 LAB — CULTURE, URINE COMPREHENSIVE

## 2020-12-19 MED ORDER — DEXTROSE 5 % IV SOLN
0.9400 g | Freq: Two times a day (BID) | INTRAVENOUS | Status: DC
  Administered 2020-12-19 – 2020-12-22 (×6): 0.94 g via INTRAVENOUS
  Filled 2020-12-19 (×8): qty 4.51

## 2020-12-19 MED ORDER — SODIUM CHLORIDE 0.9 % IV SOLN
2.0000 g | Freq: Two times a day (BID) | INTRAVENOUS | Status: DC
  Administered 2020-12-19 – 2020-12-21 (×5): 2 g via INTRAVENOUS
  Filled 2020-12-19 (×8): qty 2

## 2020-12-19 NOTE — Progress Notes (Addendum)
PROGRESS NOTE    David Myers  NFA:[id number] DOB: 1932-07-10 DOA: 12/16/2020 PCP: Rusty Aus, MD   Brief Narrative: Taken from H&P. David Myers is an 87 y.o. male who is status post TURP in 10/2020 by Dr Bernardo Heater.  Other comorbidities include diabetes mellitus, kidney stones, history of stroke,obstructive sleep apnea not compliant with CPAP.  As per wife, post TURP, patient has been having recurrent UTI/prostatitis and has been on and off antibiotics for several weeks.  He was last on levofloxacin for treatment of recurrent UTI but continued to have persistent fever with associated lower abdominal discomfort.  He went back to see urology today, urinalysis was obtained in the office and was suggestive of UTI.  Stat CT scan was requested by urology and result came back suggesting new left hydronephrosis with obstructive nephrolithiasis s/p cystoscopy and stent placement. Repeat urine cultures pending.  Patient was started on levofloxacin based on prior urine cultures. Repeat blood cultures even worsening of MDR, also resistant to Levaquin.  ID was consulted and they requested micro lab to test susceptibility for minocycline/tetracycline, aztreonam and Cholistin. Antibiotic switched with Avycaz and aztreonam while waiting for further susceptibility results.  Subjective: Patient was seen and examined today.  Having some dysuria and mild hematuria this morning.  We discussed about the culture results and how they are not sensitive to Levaquin also, we discussed about adding more antibiotics for the susceptibility results and switching to more broad-spectrum antibiotics as advised by ID with the hope that we can find some answers within next couple of days.  Assessment & Plan:   Active Problems:   Kidney stone   Pyelonephritis   Acute cystitis with hematuria   AKI (acute kidney injury) ([place])  Acute left pyelonephritis.  CT abdomen with evidence of obstructive nephrolithiasis  s/p cystoscopy and stent placement by urology.  Patient will need outpatient follow-up with urology for lithotripsy and further management.  History of multiple drug-resistant UTI only sensitive to levofloxacin on prior culture results.  Patient did received 1 week of levo but continued to experience fever.  Remained afebrile with improvement in leukocytosis since admission. Urine culture growing stenotrophomonas maltophilia-MDR, no shows resistant to Levaquin too. Microlab was requested for further susceptibility as requested by ID -Start him on Avycaz and aztreonam as advised by ID-we will guide therapy once more susceptibility results are available. -Follow-up urine culture for final recommendations of antibiotics  AKI with CKD stage IV.  Most likely secondary to obstruction.  Creatinine started improving. -Monitor renal function -Avoid nephrotoxins  Hypertension.  Blood pressure within goal. -Continue home meds  Objective: Vitals:   12/18/20 2341 12/19/20 0423 12/19/20 0840 12/19/20 1127  BP: 117/75 126/81 121/75 113/70  Pulse: 88 60 62 (!) 52  Resp: 17 17 20 18   Temp: 98.7 F (37.1 C) 97.7 F (36.5 C) 98.5 F (36.9 C) 98.4 F (36.9 C)  TempSrc:   Oral Oral  SpO2: 95% 95% 96% 97%  Weight:      Height:        Intake/Output Summary (Last 24 hours) at 12/19/2020 1546 Last data filed at 12/19/2020 1500 Gross per 24 hour  Intake 1189.09 ml  Output 2600 ml  Net -1410.91 ml   Filed Weights   12/16/20 2200  Weight: 72.4 kg    Examination:  General.  Well-developed elderly man, in no acute distress. Pulmonary.  Lungs clear bilaterally, normal respiratory effort. CV.  Regular rate and rhythm, no JVD, rub or murmur. Abdomen.  Soft, nontender, nondistended, BS positive. CNS.  Alert and oriented x3.  No focal neurologic deficit. Extremities.  No edema, no cyanosis, pulses intact and symmetrical. Psychiatry.  Judgment and insight appears normal.  DVT prophylaxis: Heparin Code  Status: Full Family Communication: Wife was updated at bed site. Disposition Plan:  Status is: Inpatient  Remains inpatient appropriate because:Inpatient level of care appropriate due to severity of illness   Dispo: The patient is from: Home              Anticipated d/c is to: Home              Patient currently is not medically stable to d/c.   Difficult to place patient No             Level of care: Med-Surg  All the records are reviewed and case discussed with Care Management/Social Worker. Management plans discussed with the patient, nursing and they are in agreement.  Consultants:   Urology  ID  Procedures:  Cystoscopy with urethral stent placement  Antimicrobials:  Avycaz Aztreonam  Data Reviewed: I have personally reviewed following labs and imaging studies  CBC: Recent Labs  Lab 12/16/20 1533 12/16/20 1639 12/17/20 0528 12/18/20 0439 12/19/20 0813  WBC 11.9* 11.6* 8.2 9.1 9.3  NEUTROABS 10.1*  --   --   --   --   HGB 11.5* 12.6* 11.1* 10.7* 11.7*  HCT 33.6* 36.1* 32.5* 30.8* 33.7*  MCV 92.3 91.2 92.1 91.4 90.3  PLT 284 308 262 276 259   Basic Metabolic Panel: Recent Labs  Lab 12/16/20 1533 12/16/20 1639 12/17/20 0528 12/18/20 0439  NA 130* 131* 134* 137  K 4.6 4.6 4.6 4.1  CL 94* 95* 101 102  CO2 25 23 23 25   GLUCOSE 153* 143* 203* 123*  BUN 33* 32* 31* 33*  CREATININE 2.71* 2.58* 2.05* 1.80*  CALCIUM 8.7* 9.3 8.6* 9.0   GFR: Estimated Creatinine Clearance: 27 mL/min (A) (by C-G formula based on SCr of 1.8 mg/dL (H)). Liver Function Tests: No results for input(s): AST, ALT, ALKPHOS, BILITOT, PROT, ALBUMIN in the last 168 hours. No results for input(s): LIPASE, AMYLASE in the last 168 hours. No results for input(s): AMMONIA in the last 168 hours. Coagulation Profile: No results for input(s): INR, PROTIME in the last 168 hours. Cardiac Enzymes: No results for input(s): CKTOTAL, CKMB, CKMBINDEX, TROPONINI in the last 168 hours. BNP (last 3  results) No results for input(s): PROBNP in the last 8760 hours. HbA1C: Recent Labs    12/17/20 0528  HGBA1C 7.8*   CBG: Recent Labs  Lab 12/17/20 1539 12/17/20 1807 12/17/20 2131 12/18/20 0753 12/18/20 1149  GLUCAP 246* 169* 164* 122* 191*   Lipid Profile: No results for input(s): CHOL, HDL, LDLCALC, TRIG, CHOLHDL, LDLDIRECT in the last 72 hours. Thyroid Function Tests: No results for input(s): TSH, T4TOTAL, FREET4, T3FREE, THYROIDAB in the last 72 hours. Anemia Panel: No results for input(s): VITAMINB12, FOLATE, FERRITIN, TIBC, IRON, RETICCTPCT in the last 72 hours. Sepsis Labs: Recent Labs  Lab 12/16/20 1533 12/16/20 1639  LATICACIDVEN 1.2 1.5    Recent Results (from the past 240 hour(s))  Microscopic Examination     Status: Abnormal   Collection Time: 12/16/20  2:35 PM   Urine  Result Value Ref Range Status   WBC, UA 11-30 (A) 0 - 5 /hpf Final   RBC >30 (H) 0 - 2 /hpf Final   Epithelial Cells (non renal) 0-10 0 - 10 /hpf Final  Casts Present (A) None seen /lpf Final   Cast Type Hyaline casts N/A Final   Bacteria, UA None seen None seen/Few Final  CULTURE, URINE COMPREHENSIVE     Status: None   Collection Time: 12/16/20  4:29 PM   Specimen: Urine   UR  Result Value Ref Range Status   Urine Culture, Comprehensive Final report  Final   Organism ID, Bacteria Comment  Final    Comment: Mixed urogenital flora 2,000 Colonies/mL   Blood culture (routine x 2)     Status: None (Preliminary result)   Collection Time: 12/16/20  4:39 PM   Specimen: BLOOD  Result Value Ref Range Status   Specimen Description BLOOD RIGHT ANTECUBITAL  Final   Special Requests   Final    BOTTLES DRAWN AEROBIC AND ANAEROBIC Blood Culture adequate volume   Culture   Final    NO GROWTH 3 DAYS Performed at [facility], [address], [place], [place] [address]    Report Status PENDING  Incomplete  Resp Panel by RT-PCR (Flu A&B, Covid) Nasopharyngeal Swab     Status: None    Collection Time: 12/16/20  6:05 PM   Specimen: Nasopharyngeal Swab; Nasopharyngeal(NP) swabs in vial transport medium  Result Value Ref Range Status   SARS Coronavirus 2 by RT PCR NEGATIVE NEGATIVE Final    Comment: (NOTE) SARS-CoV-2 target nucleic acids are NOT DETECTED.  The SARS-CoV-2 RNA is generally detectable in upper respiratory specimens during the acute phase of infection. The lowest concentration of SARS-CoV-2 viral copies this assay can detect is 138 copies/mL. A negative result does not preclude SARS-Cov-2 infection and should not be used as the sole basis for treatment or other patient management decisions. A negative result may occur with  improper specimen collection/handling, submission of specimen other than nasopharyngeal swab, presence of viral mutation(s) within the areas targeted by this assay, and inadequate number of viral copies(<138 copies/mL). A negative result must be combined with clinical observations, patient history, and epidemiological information. The expected result is Negative.  Fact Sheet for Patients:  EntrepreneurPulse.com.au  Fact Sheet for Healthcare Providers:  IncredibleEmployment.be  This test is no t yet approved or cleared by the [place] FDA and  has been authorized for detection and/or diagnosis of SARS-CoV-2 by FDA under an Emergency Use Authorization (EUA). This EUA will remain  in effect (meaning this test can be used) for the duration of the COVID-19 declaration under Section 564(b)(1) of the Act, 21 U.S.C.section 360bbb-3(b)(1), unless the authorization is terminated  or revoked sooner.       Influenza A by PCR NEGATIVE NEGATIVE Final   Influenza B by PCR NEGATIVE NEGATIVE Final    Comment: (NOTE) The Xpert Xpress SARS-CoV-2/FLU/RSV plus assay is intended as an aid in the diagnosis of influenza from Nasopharyngeal swab specimens and should not be used as a sole basis for treatment.  Nasal washings and aspirates are unacceptable for Xpert Xpress SARS-CoV-2/FLU/RSV testing.  Fact Sheet for Patients: EntrepreneurPulse.com.au  Fact Sheet for Healthcare Providers: IncredibleEmployment.be  This test is not yet approved or cleared by the [place] FDA and has been authorized for detection and/or diagnosis of SARS-CoV-2 by FDA under an Emergency Use Authorization (EUA). This EUA will remain in effect (meaning this test can be used) for the duration of the COVID-19 declaration under Section 564(b)(1) of the Act, 21 U.S.C. section 360bbb-3(b)(1), unless the authorization is terminated or revoked.  Performed at [facility], [address], [place], [place] [address]  Urine Culture     Status: None   Collection Time: 12/16/20  6:05 PM   Specimen: Urine, Random  Result Value Ref Range Status   Specimen Description   Final    URINE, RANDOM Performed at [facility], [address], [place], [place] [address]    Special Requests   Final    NONE Performed at [facility], [address], [place], [place] [address]    Culture   Final    NO GROWTH Performed at [facility], [place] [address], [place], [place] [address]    Report Status 12/18/2020 FINAL  Final  Anaerobic culture w Gram Stain     Status: None (Preliminary result)   Collection Time: 12/16/20  7:37 PM   Specimen: Urine, Cystoscope  Result Value Ref Range Status   Specimen Description   Final    URINE, CATHETERIZED LEFT RENAL PELVIS Performed at [facility], [place] [address] [place], [place] [address]    Special Requests   Final    NONE Performed at [facility], [address] [place], [place] [address]    Gram Stain   Final    FEW WBC PRESENT, PREDOMINANTLY PMN MODERATE GRAM NEGATIVE RODS    Culture   Final    HOLDING FOR POSSIBLE ANAEROBE Performed at [facility], [place] [address],  [place], [place] [address]    Report Status PENDING  Incomplete  Urine culture     Status: Abnormal   Collection Time: 12/16/20  7:56 PM   Specimen: Urine, Catheterized  Result Value Ref Range Status   Specimen Description   Final    URINE, CATHETERIZED LEFT RENAL PELVIS Performed at [facility], [place] [address], [place], [place] [address]    Special Requests   Final    NONE Performed at [facility], [address] [place], [place] [address]    Culture (A)  Final    >=100,000 COLONIES/mL STENOTROPHOMONAS MALTOPHILIA MULTI-DRUG RESISTANT ORGANISM RESULT CALLED TO, READ BACK BY AND VERIFIED WITH: DR [facility] ON [id number] AT 0830 BY E.PARRISH Performed at [facility], [address], [place],  [address]    Report Status 12/19/2020 FINAL  Final   Organism ID, Bacteria STENOTROPHOMONAS MALTOPHILIA (A)  Final      Susceptibility   Stenotrophomonas maltophilia - MIC*    LEVOFLOXACIN >=8 RESISTANT Resistant     TRIMETH/SULFA >=320 RESISTANT Resistant     * >=100,000 COLONIES/mL STENOTROPHOMONAS MALTOPHILIA     Radiology Studies: No results found.  Scheduled Meds: . aspirin EC  81 mg Oral Daily  . Chlorhexidine Gluconate Cloth  6 each Topical Daily  . cholecalciferol  1,000 Units Oral Daily  . docusate sodium  100 mg Oral BID  . famotidine  20 mg Oral QHS  . heparin  5,000 Units Subcutaneous Q8H  . losartan  25 mg Oral Daily   And  . hydrochlorothiazide  6.25 mg Oral Daily  . insulin aspart  0-6 Units Subcutaneous TID WC  . Netarsudil-Latanoprost  1 drop Ophthalmic Daily  . pantoprazole  40 mg Oral QAC breakfast  . simvastatin  20 mg Oral QHS  . tamsulosin  0.4 mg Oral Daily   Continuous Infusions: . sodium chloride Stopped (12/18/20 2330)  . aztreonam    . ceftazidime avibactam (AVYCAZ) IVPB 0.94 g (12/19/20 1449)     LOS: 3 days   Time spent: 45 minutes. More than 50% of the  time was spent in counseling/coordination of care  Lorella Nimrod,  MD [facility]  If 7PM-7AM, please contact night-coverage [email or web address]  12/19/2020, 3:46 PM   This record has been created using Systems analyst. Errors have been sought and corrected,but may not always be located. Such creation errors do not reflect on the standard of care.

## 2020-12-19 NOTE — Consult Note (Signed)
Pharmacy Antibiotic Note  David Myers is a 87 y.o. male with medical history including TURP 10/2020, diabetes, kidney stones, history of CVA, OSA, recurrent UTI / prostatitis since TURP admitted on 12/16/2020 with obstructive nephrolithasis / new left hydronephrosis. Urology performed urgent cystoscopy and stent placement on 5/18. Patient was empirically placed on LVQ for MDR Stenotrophomonas identified on 11/26/20 UCx which was only LVQ susceptible. Cultures from this admission with MDR Stenotrophomonas which is LVQ R and Bactrim R. ID has been curb sided for further assistance. Pharmacy has been consulted for Avycaz and aztreonam dosing.  Discussed with micro lab at [facility] - only susceptibilities for Bactrim and LVQ were reported by [facility]. Per ID recommendations, have requested susceptibilities for minocycline/tetracycline, aztreonam, and colistin.   Plan:  Avycaz 0.94g IV q12h (3-hr infusion) -- renally adjusted  Aztreonam 2 g IV q12h (3-hr infusion) -- renally adjusted  Antibiotics timed to be given simultaneously   Await further susceptibility testing to guide therapy  Height: 5\' 7"  (170.2 cm) Weight: 72.4 kg (159 lb 9.8 oz) IBW/kg (Calculated) : 66.1  Temp (24hrs), Avg:98.1 F (36.7 C), Min:97.6 F (36.4 C), Max:98.7 F (37.1 C)  Recent Labs  Lab 12/16/20 1533 12/16/20 1639 12/17/20 0528 12/18/20 0439 12/19/20 0813  WBC 11.9* 11.6* 8.2 9.1 9.3  CREATININE 2.71* 2.58* 2.05* 1.80*  --   LATICACIDVEN 1.2 1.5  --   --   --     Estimated Creatinine Clearance: 27 mL/min (A) (by C-G formula based on SCr of 1.8 mg/dL (H)).    Allergies  Allergen Reactions  . Augmentin [Amoxicillin-Pot Clavulanate] Diarrhea    Led to C-diff     Antimicrobials this admission: Levofloxacin 5/18 >> 5/20 Avycaz 5/21 >>  Aztreonam 5/21 >>   Dose adjustments this admission: N/A  Microbiology results: 5/18 BCx: NGTD 5/18 UCx: NG 5/18 UCx (L renal pelvis): MDR Stenotrophomonas  maltophilia 5/18 UCx (L renal pelvis): GNR, pending   Thank you for allowing pharmacy to be a part of this patient's care.  Benita Gutter 12/19/2020 11:35 AM

## 2020-12-20 DIAGNOSIS — N200 Calculus of kidney: Secondary | ICD-10-CM | POA: Diagnosis not present

## 2020-12-20 DIAGNOSIS — N3001 Acute cystitis with hematuria: Secondary | ICD-10-CM | POA: Diagnosis not present

## 2020-12-20 DIAGNOSIS — N179 Acute kidney failure, unspecified: Secondary | ICD-10-CM | POA: Diagnosis not present

## 2020-12-20 DIAGNOSIS — N12 Tubulo-interstitial nephritis, not specified as acute or chronic: Secondary | ICD-10-CM | POA: Diagnosis not present

## 2020-12-20 LAB — BASIC METABOLIC PANEL
Anion gap: 10 (ref 5–15)
BUN: 37 mg/dL — ABNORMAL HIGH (ref 8–23)
CO2: 25 mmol/L (ref 22–32)
Calcium: 8.8 mg/dL — ABNORMAL LOW (ref 8.9–10.3)
Chloride: 102 mmol/L (ref 98–111)
Creatinine, Ser: 1.68 mg/dL — ABNORMAL HIGH (ref 0.61–1.24)
GFR, Estimated: 39 mL/min — ABNORMAL LOW (ref >60)
Glucose, Bld: 132 mg/dL — ABNORMAL HIGH (ref 70–99)
Potassium: 4 mmol/L (ref 3.5–5.1)
Sodium: 137 mmol/L (ref 135–145)

## 2020-12-20 LAB — CULTURE, URINE COMPREHENSIVE

## 2020-12-20 LAB — CBC
HCT: 32.1 % — ABNORMAL LOW (ref 39.0–52.0)
Hemoglobin: 10.9 g/dL — ABNORMAL LOW (ref 13.0–17.0)
MCH: 30.6 pg (ref 26.0–34.0)
MCHC: 34 g/dL (ref 30.0–36.0)
MCV: 90.2 fL (ref 80.0–100.0)
Platelets: 276 10*3/uL (ref 150–400)
RBC: 3.56 MIL/uL — ABNORMAL LOW (ref 4.22–5.81)
RDW: 12.7 % (ref 11.5–15.5)
WBC: 7.7 10*3/uL (ref 4.0–10.5)
nRBC: 0 % (ref 0.0–0.2)

## 2020-12-20 NOTE — Plan of Care (Signed)

## 2020-12-20 NOTE — Progress Notes (Signed)
PROGRESS NOTE    David Myers  JSE:[id number] DOB: 10-01-1931 DOA: 12/16/2020 PCP: David Aus, MD   Brief Narrative: Taken from H&P. David Myers is an 87 y.o. male who is status post TURP in 10/2020 by Dr David Myers.  Other comorbidities include diabetes mellitus, kidney stones, history of stroke,obstructive sleep apnea not compliant with CPAP.  As per wife, post TURP, patient has been having recurrent UTI/prostatitis and has been on and off antibiotics for several weeks.  He was last on levofloxacin for treatment of recurrent UTI but continued to have persistent fever with associated lower abdominal discomfort.  He went back to see urology today, urinalysis was obtained in the office and was suggestive of UTI.  Stat CT scan was requested by urology and result came back suggesting new left hydronephrosis with obstructive nephrolithiasis s/p cystoscopy and stent placement. Repeat urine cultures pending.  Patient was started on levofloxacin based on prior urine cultures. Repeat blood cultures even worsening of MDR, also resistant to Levaquin.  ID was consulted and they requested micro lab to test susceptibility for minocycline/tetracycline, aztreonam and Cholistin. Antibiotic switched with Avycaz and aztreonam while waiting for further susceptibility results.  Subjective: Patient was seen and examined today.  No new complaints.  Wife at bedside.  They are hoping to find a good antibiotic to help with his infection.  Assessment & Plan:   Active Problems:   Kidney stone   Pyelonephritis   Acute cystitis with hematuria   AKI (acute kidney injury) ([place])  Acute left pyelonephritis.  CT abdomen with evidence of obstructive nephrolithiasis s/p cystoscopy and stent placement by urology.  Patient will need outpatient follow-up with urology for lithotripsy and further management.  History of multiple drug-resistant UTI only sensitive to levofloxacin on prior culture results.  Patient  did received 1 week of levo but continued to experience fever.  Remained afebrile with improvement in leukocytosis since admission. Urine culture growing stenotrophomonas maltophilia-MDR, no shows resistant to Levaquin too. Microlab was requested for further susceptibility as requested by ID -Start him on Avycaz and aztreonam as advised by ID-we will guide therapy once more susceptibility results are available. -Follow-up urine culture for final recommendations of antibiotics-still pending  AKI with CKD stage IV.  Most likely secondary to obstruction.  Creatinine started improving. -Monitor renal function -Avoid nephrotoxins  Hypertension.  Blood pressure within goal. -Continue home meds  Objective: Vitals:   12/19/20 1127 12/19/20 2015 12/20/20 0516 12/20/20 0739  BP: 113/70 100/64 107/67 101/67  Pulse: (!) 52 71 69 63  Resp: 18 15 18 16   Temp: 98.4 F (36.9 C) 98 F (36.7 C) 97.8 F (36.6 C) 98.3 F (36.8 C)  TempSrc: Oral     SpO2: 97% 100% 95% 94%  Weight:      Height:        Intake/Output Summary (Last 24 hours) at 12/20/2020 1359 Last data filed at 12/20/2020 0737 Gross per 24 hour  Intake 800 ml  Output 1450 ml  Net -650 ml   Filed Weights   12/16/20 2200  Weight: 72.4 kg    Examination:  General.  Pleasant elderly man, in no acute distress. Pulmonary.  Lungs clear bilaterally, normal respiratory effort. CV.  Regular rate and rhythm, no JVD, rub or murmur. Abdomen.  Soft, nontender, nondistended, BS positive. CNS.  Alert and oriented x3.  No focal neurologic deficit. Extremities.  No edema, no cyanosis, pulses intact and symmetrical. Psychiatry.  Judgment and insight appears normal.  DVT prophylaxis:  Heparin Code Status: Full Family Communication: Wife was updated at bed site. Disposition Plan:  Status is: Inpatient  Remains inpatient appropriate because:Inpatient level of care appropriate due to severity of illness   Dispo: The patient is from:  Home              Anticipated d/c is to: Home              Patient currently is not medically stable to d/c.   Difficult to place patient No             Level of care: Med-Surg  All the records are reviewed and case discussed with Care Management/Social Worker. Management plans discussed with the patient, nursing and they are in agreement.  Consultants:   Urology  ID  Procedures:  Cystoscopy with urethral stent placement  Antimicrobials:  Avycaz Aztreonam  Data Reviewed: I have personally reviewed following labs and imaging studies  CBC: Recent Labs  Lab 12/16/20 1533 12/16/20 1639 12/17/20 0528 12/18/20 0439 12/19/20 0813 12/20/20 0539  WBC 11.9* 11.6* 8.2 9.1 9.3 7.7  NEUTROABS 10.1*  --   --   --   --   --   HGB 11.5* 12.6* 11.1* 10.7* 11.7* 10.9*  HCT 33.6* 36.1* 32.5* 30.8* 33.7* 32.1*  MCV 92.3 91.2 92.1 91.4 90.3 90.2  PLT 284 308 262 276 288 010   Basic Metabolic Panel: Recent Labs  Lab 12/16/20 1533 12/16/20 1639 12/17/20 0528 12/18/20 0439 12/20/20 0539  NA 130* 131* 134* 137 137  K 4.6 4.6 4.6 4.1 4.0  CL 94* 95* 101 102 102  CO2 25 23 23 25 25   GLUCOSE 153* 143* 203* 123* 132*  BUN 33* 32* 31* 33* 37*  CREATININE 2.71* 2.58* 2.05* 1.80* 1.68*  CALCIUM 8.7* 9.3 8.6* 9.0 8.8*   GFR: Estimated Creatinine Clearance: 29 mL/min (A) (by C-G formula based on SCr of 1.68 mg/dL (H)). Liver Function Tests: No results for input(s): AST, ALT, ALKPHOS, BILITOT, PROT, ALBUMIN in the last 168 hours. No results for input(s): LIPASE, AMYLASE in the last 168 hours. No results for input(s): AMMONIA in the last 168 hours. Coagulation Profile: No results for input(s): INR, PROTIME in the last 168 hours. Cardiac Enzymes: No results for input(s): CKTOTAL, CKMB, CKMBINDEX, TROPONINI in the last 168 hours. BNP (last 3 results) No results for input(s): PROBNP in the last 8760 hours. HbA1C: No results for input(s): HGBA1C in the last 72 hours. CBG: Recent Labs   Lab 12/17/20 1539 12/17/20 1807 12/17/20 2131 12/18/20 0753 12/18/20 1149  GLUCAP 246* 169* 164* 122* 191*   Lipid Profile: No results for input(s): CHOL, HDL, LDLCALC, TRIG, CHOLHDL, LDLDIRECT in the last 72 hours. Thyroid Function Tests: No results for input(s): TSH, T4TOTAL, FREET4, T3FREE, THYROIDAB in the last 72 hours. Anemia Panel: No results for input(s): VITAMINB12, FOLATE, FERRITIN, TIBC, IRON, RETICCTPCT in the last 72 hours. Sepsis Labs: Recent Labs  Lab 12/16/20 1533 12/16/20 1639  LATICACIDVEN 1.2 1.5    Recent Results (from the past 240 hour(s))  Microscopic Examination     Status: Abnormal   Collection Time: 12/16/20  2:35 PM   Urine  Result Value Ref Range Status   WBC, UA 11-30 (A) 0 - 5 /hpf Final   RBC >30 (H) 0 - 2 /hpf Final   Epithelial Cells (non renal) 0-10 0 - 10 /hpf Final   Casts Present (A) None seen /lpf Final   Cast Type Hyaline casts N/A Final  Bacteria, UA None seen None seen/Few Final  CULTURE, URINE COMPREHENSIVE     Status: None   Collection Time: 12/16/20  4:29 PM   Specimen: Urine   UR  Result Value Ref Range Status   Urine Culture, Comprehensive Final report  Final   Organism ID, Bacteria Comment  Final    Comment: Mixed urogenital flora 2,000 Colonies/mL   Blood culture (routine x 2)     Status: None (Preliminary result)   Collection Time: 12/16/20  4:39 PM   Specimen: BLOOD  Result Value Ref Range Status   Specimen Description BLOOD RIGHT ANTECUBITAL  Final   Special Requests   Final    BOTTLES DRAWN AEROBIC AND ANAEROBIC Blood Culture adequate volume   Culture   Final    NO GROWTH 4 DAYS Performed at [facility], [address], [place], [place] [address]    Report Status PENDING  Incomplete  Resp Panel by RT-PCR (Flu A&B, Covid) Nasopharyngeal Swab     Status: None   Collection Time: 12/16/20  6:05 PM   Specimen: Nasopharyngeal Swab; Nasopharyngeal(NP) swabs in vial transport medium  Result Value Ref  Range Status   SARS Coronavirus 2 by RT PCR NEGATIVE NEGATIVE Final    Comment: (NOTE) SARS-CoV-2 target nucleic acids are NOT DETECTED.  The SARS-CoV-2 RNA is generally detectable in upper respiratory specimens during the acute phase of infection. The lowest concentration of SARS-CoV-2 viral copies this assay can detect is 138 copies/mL. A negative result does not preclude SARS-Cov-2 infection and should not be used as the sole basis for treatment or other patient management decisions. A negative result may occur with  improper specimen collection/handling, submission of specimen other than nasopharyngeal swab, presence of viral mutation(s) within the areas targeted by this assay, and inadequate number of viral copies(<138 copies/mL). A negative result must be combined with clinical observations, patient history, and epidemiological information. The expected result is Negative.  Fact Sheet for Patients:  EntrepreneurPulse.com.au  Fact Sheet for Healthcare Providers:  IncredibleEmployment.be  This test is no t yet approved or cleared by the [place] FDA and  has been authorized for detection and/or diagnosis of SARS-CoV-2 by FDA under an Emergency Use Authorization (EUA). This EUA will remain  in effect (meaning this test can be used) for the duration of the COVID-19 declaration under Section 564(b)(1) of the Act, 21 U.S.C.section 360bbb-3(b)(1), unless the authorization is terminated  or revoked sooner.       Influenza A by PCR NEGATIVE NEGATIVE Final   Influenza B by PCR NEGATIVE NEGATIVE Final    Comment: (NOTE) The Xpert Xpress SARS-CoV-2/FLU/RSV plus assay is intended as an aid in the diagnosis of influenza from Nasopharyngeal swab specimens and should not be used as a sole basis for treatment. Nasal washings and aspirates are unacceptable for Xpert Xpress SARS-CoV-2/FLU/RSV testing.  Fact Sheet for  Patients: EntrepreneurPulse.com.au  Fact Sheet for Healthcare Providers: IncredibleEmployment.be  This test is not yet approved or cleared by the [place] FDA and has been authorized for detection and/or diagnosis of SARS-CoV-2 by FDA under an Emergency Use Authorization (EUA). This EUA will remain in effect (meaning this test can be used) for the duration of the COVID-19 declaration under Section 564(b)(1) of the Act, 21 U.S.C. section 360bbb-3(b)(1), unless the authorization is terminated or revoked.  Performed at [facility], [address] [place], [place] [address]   Urine Culture     Status: None   Collection Time: 12/16/20  6:05  PM   Specimen: Urine, Random  Result Value Ref Range Status   Specimen Description   Final    URINE, RANDOM Performed at [facility], [address] [place], [place] [address]    Special Requests   Final    NONE Performed at [facility], [address] [place], [place] [address]    Culture   Final    NO GROWTH Performed at [facility], [place] [address], [place], [place] [address]    Report Status 12/18/2020 FINAL  Final  Anaerobic culture w Gram Stain     Status: None (Preliminary result)   Collection Time: 12/16/20  7:37 PM   Specimen: Urine, Cystoscope  Result Value Ref Range Status   Specimen Description   Final    URINE, CATHETERIZED LEFT RENAL PELVIS Performed at [facility], [place] [address] [place], [place] [address]    Special Requests   Final    NONE Performed at [facility], [place], [place] [address]    Gram Stain   Final    FEW WBC PRESENT, PREDOMINANTLY PMN MODERATE GRAM NEGATIVE RODS Performed at [facility], [place] [address], [place], [place] [address]    Culture   Final    NO ANAEROBES ISOLATED; CULTURE IN PROGRESS FOR 5 DAYS   Report Status PENDING  Incomplete  Urine culture     Status: Abnormal    Collection Time: 12/16/20  7:56 PM   Specimen: Urine, Catheterized  Result Value Ref Range Status   Specimen Description   Final    URINE, CATHETERIZED LEFT RENAL PELVIS Performed at [facility], [place] [address], [place], [place] [address]    Special Requests   Final    NONE Performed at [facility], [address] [place], [place] [address]    Culture (A)  Final    >=100,000 COLONIES/mL STENOTROPHOMONAS MALTOPHILIA MULTI-DRUG RESISTANT ORGANISM RESULT CALLED TO, READ BACK BY AND VERIFIED WITH: DR [facility] ON [id number] AT 0830 BY E.PARRISH Performed at [facility], [address] [place], [place] [address]    Report Status 12/19/2020 FINAL  Final   Organism ID, Bacteria STENOTROPHOMONAS MALTOPHILIA (A)  Final      Susceptibility   Stenotrophomonas maltophilia - MIC*    LEVOFLOXACIN >=8 RESISTANT Resistant     TRIMETH/SULFA >=320 RESISTANT Resistant     * >=100,000 COLONIES/mL STENOTROPHOMONAS MALTOPHILIA     Radiology Studies: No results found.  Scheduled Meds: . aspirin EC  81 mg Oral Daily  . Chlorhexidine Gluconate Cloth  6 each Topical Daily  . cholecalciferol  1,000 Units Oral Daily  . docusate sodium  100 mg Oral BID  . famotidine  20 mg Oral QHS  . heparin  5,000 Units Subcutaneous Q8H  . losartan  25 mg Oral Daily   And  . hydrochlorothiazide  6.25 mg Oral Daily  . insulin aspart  0-6 Units Subcutaneous TID WC  . Netarsudil-Latanoprost  1 drop Ophthalmic Daily  . pantoprazole  40 mg Oral QAC breakfast  . simvastatin  20 mg Oral QHS  . tamsulosin  0.4 mg Oral Daily   Continuous Infusions: . sodium chloride Stopped (12/18/20 2330)  . aztreonam 2 g (12/20/20 0510)  . ceftazidime avibactam (AVYCAZ) IVPB 0.94 g (12/20/20 0911)     LOS: 4 days   Time spent: 35 minutes. More than 50% of the time was spent in counseling/coordination of care  [facility]  Reesa Chew, MD [facility]  If 7PM-7AM, please contact  night-coverage [email or web address]  12/20/2020, 1:59 PM   This record has been created using Systems analyst. Errors have been sought and corrected,but may not always be located. Such creation errors do not reflect on the standard of care.

## 2020-12-21 ENCOUNTER — Ambulatory Visit: Payer: Self-pay | Admitting: Urology

## 2020-12-21 DIAGNOSIS — N200 Calculus of kidney: Secondary | ICD-10-CM

## 2020-12-21 DIAGNOSIS — N179 Acute kidney failure, unspecified: Secondary | ICD-10-CM | POA: Diagnosis not present

## 2020-12-21 DIAGNOSIS — N12 Tubulo-interstitial nephritis, not specified as acute or chronic: Secondary | ICD-10-CM | POA: Diagnosis not present

## 2020-12-21 DIAGNOSIS — N3001 Acute cystitis with hematuria: Secondary | ICD-10-CM | POA: Diagnosis not present

## 2020-12-21 LAB — GLUCOSE, CAPILLARY
Glucose-Capillary: 121 mg/dL — ABNORMAL HIGH (ref 70–99)
Glucose-Capillary: 144 mg/dL — ABNORMAL HIGH (ref 70–99)
Glucose-Capillary: 153 mg/dL — ABNORMAL HIGH (ref 70–99)
Glucose-Capillary: 126 mg/dL — ABNORMAL HIGH (ref 70–99)
Glucose-Capillary: 112 mg/dL — ABNORMAL HIGH (ref 70–99)
Glucose-Capillary: 156 mg/dL — ABNORMAL HIGH (ref 70–99)
Glucose-Capillary: 153 mg/dL — ABNORMAL HIGH (ref 70–99)
Glucose-Capillary: 134 mg/dL — ABNORMAL HIGH (ref 70–99)
Glucose-Capillary: 141 mg/dL — ABNORMAL HIGH (ref 70–99)
Glucose-Capillary: 168 mg/dL — ABNORMAL HIGH (ref 70–99)
Glucose-Capillary: 144 mg/dL — ABNORMAL HIGH (ref 70–99)
Glucose-Capillary: 143 mg/dL — ABNORMAL HIGH (ref 70–99)
Glucose-Capillary: 148 mg/dL — ABNORMAL HIGH (ref 70–99)

## 2020-12-21 LAB — CULTURE, BLOOD (ROUTINE X 2)
Culture: NO GROWTH
Special Requests: ADEQUATE

## 2020-12-21 MED ORDER — LOSARTAN POTASSIUM 25 MG PO TABS
12.5000 mg | ORAL_TABLET | Freq: Every day | ORAL | Status: DC
  Administered 2020-12-22: 12.5 mg via ORAL
  Filled 2020-12-21: qty 1

## 2020-12-21 MED ORDER — RISAQUAD PO CAPS
1.0000 | ORAL_CAPSULE | Freq: Every day | ORAL | Status: DC
  Administered 2020-12-21 – 2020-12-30 (×9): 1 via ORAL
  Filled 2020-12-21 (×9): qty 1

## 2020-12-21 MED ORDER — HYDROCHLOROTHIAZIDE 10 MG/ML ORAL SUSPENSION
6.2500 mg | Freq: Every day | ORAL | Status: DC
  Administered 2020-12-22: 6.25 mg via ORAL
  Filled 2020-12-21 (×2): qty 1.25

## 2020-12-21 NOTE — Progress Notes (Signed)
PROGRESS NOTE    David Myers  SJG:[id number] DOB: 10/30/1931 DOA: 12/16/2020 PCP: Rusty Aus, MD   Brief Narrative: Taken from H&P. David Myers is an 87 y.o. male who is status post TURP in 10/2020 by Dr Bernardo Heater.  Other comorbidities include diabetes mellitus, kidney stones, history of stroke,obstructive sleep apnea not compliant with CPAP.  As per wife, post TURP, patient has been having recurrent UTI/prostatitis and has been on and off antibiotics for several weeks.  He was last on levofloxacin for treatment of recurrent UTI but continued to have persistent fever with associated lower abdominal discomfort.  He went back to see urology today, urinalysis was obtained in the office and was suggestive of UTI.  Stat CT scan was requested by urology and result came back suggesting new left hydronephrosis with obstructive nephrolithiasis s/p cystoscopy and stent placement. Repeat urine cultures pending.  Patient was started on levofloxacin based on prior urine cultures. Repeat blood cultures even worsening of MDR, also resistant to Levaquin.  ID was consulted and they requested micro lab to test susceptibility for minocycline/tetracycline, aztreonam and Cholistin. Antibiotic switched with Avycaz and aztreonam while waiting for further susceptibility results.  Subjective: Patient is seen and examined today.  No new complaint.  Patient wants to go home but wife does not feel comfortable taking him home while he is on IV antibiotics. We discussed about still waiting for final culture results with little expanded sensitivity to narrow down his antibiotics.  Assessment & Plan:   Active Problems:   Kidney stone   Pyelonephritis   Acute cystitis with hematuria   AKI (acute kidney injury) ([place])  Acute left pyelonephritis.  CT abdomen with evidence of obstructive nephrolithiasis s/p cystoscopy and stent placement by urology.  Patient will need outpatient follow-up with urology for  lithotripsy and further management.  History of multiple drug-resistant UTI only sensitive to levofloxacin on prior culture results.  Patient did received 1 week of levo but continued to experience fever.  Remained afebrile with improvement in leukocytosis since admission. Urine culture growing stenotrophomonas maltophilia-MDR, no shows resistant to Levaquin too. Microlab was requested for further susceptibility as requested by ID -Start him on Avycaz and aztreonam as advised by ID-we will guide therapy once more susceptibility results are available. -Follow-up urine culture for final recommendations of antibiotics-still pending  AKI with CKD stage IV.  Most likely secondary to obstruction.  Creatinine started improving. -Monitor renal function -Avoid nephrotoxins  Hypertension.  Blood pressure within goal. -Continue home meds  Objective: Vitals:   12/20/20 2058 12/21/20 0403 12/21/20 0819 12/21/20 1221  BP: (!) 152/93 (!) 99/58 102/67 102/62  Pulse: 99 60 64 74  Resp: 18 17 20 17   Temp: 98.7 F (37.1 C) 98.1 F (36.7 C) 97.7 F (36.5 C) 98 F (36.7 C)  TempSrc: Oral Oral Oral Oral  SpO2: 91% 97% 91% 96%  Weight:      Height:        Intake/Output Summary (Last 24 hours) at 12/21/2020 1330 Last data filed at 12/21/2020 0402 Gross per 24 hour  Intake 480 ml  Output 1325 ml  Net -845 ml   Filed Weights   12/16/20 2200  Weight: 72.4 kg    Examination:  General.  Well-developed elderly man, in no acute distress. Pulmonary.  Lungs clear bilaterally, normal respiratory effort. CV.  Regular rate and rhythm, no JVD, rub or murmur. Abdomen.  Soft, nontender, nondistended, BS positive. CNS.  Alert and oriented x3.  No focal  neurologic deficit. Extremities.  No edema, no cyanosis, pulses intact and symmetrical. Psychiatry.  Judgment and insight appears normal.  DVT prophylaxis: Heparin Code Status: Full Family Communication: Wife was updated at bed site. Disposition Plan:   Status is: Inpatient  Remains inpatient appropriate because:Inpatient level of care appropriate due to severity of illness   Dispo: The patient is from: Home              Anticipated d/c is to: Home              Patient currently is not medically stable to d/c.   Difficult to place patient No             Level of care: Med-Surg  All the records are reviewed and case discussed with Care Management/Social Worker. Management plans discussed with the patient, nursing and they are in agreement.  Consultants:   Urology  ID  Procedures:  Cystoscopy with urethral stent placement  Antimicrobials:  Avycaz Aztreonam  Data Reviewed: I have personally reviewed following labs and imaging studies  CBC: Recent Labs  Lab 12/16/20 1533 12/16/20 1639 12/17/20 0528 12/18/20 0439 12/19/20 0813 12/20/20 0539  WBC 11.9* 11.6* 8.2 9.1 9.3 7.7  NEUTROABS 10.1*  --   --   --   --   --   HGB 11.5* 12.6* 11.1* 10.7* 11.7* 10.9*  HCT 33.6* 36.1* 32.5* 30.8* 33.7* 32.1*  MCV 92.3 91.2 92.1 91.4 90.3 90.2  PLT 284 308 262 276 288 564   Basic Metabolic Panel: Recent Labs  Lab 12/16/20 1533 12/16/20 1639 12/17/20 0528 12/18/20 0439 12/20/20 0539  NA 130* 131* 134* 137 137  K 4.6 4.6 4.6 4.1 4.0  CL 94* 95* 101 102 102  CO2 25 23 23 25 25   GLUCOSE 153* 143* 203* 123* 132*  BUN 33* 32* 31* 33* 37*  CREATININE 2.71* 2.58* 2.05* 1.80* 1.68*  CALCIUM 8.7* 9.3 8.6* 9.0 8.8*   GFR: Estimated Creatinine Clearance: 29 mL/min (A) (by C-G formula based on SCr of 1.68 mg/dL (H)). Liver Function Tests: No results for input(s): AST, ALT, ALKPHOS, BILITOT, PROT, ALBUMIN in the last 168 hours. No results for input(s): LIPASE, AMYLASE in the last 168 hours. No results for input(s): AMMONIA in the last 168 hours. Coagulation Profile: No results for input(s): INR, PROTIME in the last 168 hours. Cardiac Enzymes: No results for input(s): CKTOTAL, CKMB, CKMBINDEX, TROPONINI in the last 168  hours. BNP (last 3 results) No results for input(s): PROBNP in the last 8760 hours. HbA1C: No results for input(s): HGBA1C in the last 72 hours. CBG: Recent Labs  Lab 12/20/20 0809 12/20/20 1206 12/20/20 1639 12/20/20 2125 12/21/20 1126  GLUCAP 134* 168* 144* 143* 148*   Lipid Profile: No results for input(s): CHOL, HDL, LDLCALC, TRIG, CHOLHDL, LDLDIRECT in the last 72 hours. Thyroid Function Tests: No results for input(s): TSH, T4TOTAL, FREET4, T3FREE, THYROIDAB in the last 72 hours. Anemia Panel: No results for input(s): VITAMINB12, FOLATE, FERRITIN, TIBC, IRON, RETICCTPCT in the last 72 hours. Sepsis Labs: Recent Labs  Lab 12/16/20 1533 12/16/20 1639  LATICACIDVEN 1.2 1.5    Recent Results (from the past 240 hour(s))  Microscopic Examination     Status: Abnormal   Collection Time: 12/16/20  2:35 PM   Urine  Result Value Ref Range Status   WBC, UA 11-30 (A) 0 - 5 /hpf Final   RBC >30 (H) 0 - 2 /hpf Final   Epithelial Cells (non renal) 0-10 0 -  10 /hpf Final   Casts Present (A) None seen /lpf Final   Cast Type Hyaline casts N/A Final   Bacteria, UA None seen None seen/Few Final  CULTURE, URINE COMPREHENSIVE     Status: None   Collection Time: 12/16/20  4:29 PM   Specimen: Urine   UR  Result Value Ref Range Status   Urine Culture, Comprehensive Final report  Final   Organism ID, Bacteria Comment  Final    Comment: Mixed urogenital flora 2,000 Colonies/mL   Blood culture (routine x 2)     Status: None (Preliminary result)   Collection Time: 12/16/20  4:39 PM   Specimen: BLOOD  Result Value Ref Range Status   Specimen Description BLOOD RIGHT ANTECUBITAL  Final   Special Requests   Final    BOTTLES DRAWN AEROBIC AND ANAEROBIC Blood Culture adequate volume   Culture   Final    NO GROWTH 4 DAYS Performed at [facility], [address] [place], [place] [address]    Report Status PENDING  Incomplete  Resp Panel by RT-PCR (Flu A&B, Covid)  Nasopharyngeal Swab     Status: None   Collection Time: 12/16/20  6:05 PM   Specimen: Nasopharyngeal Swab; Nasopharyngeal(NP) swabs in vial transport medium  Result Value Ref Range Status   SARS Coronavirus 2 by RT PCR NEGATIVE NEGATIVE Final    Comment: (NOTE) SARS-CoV-2 target nucleic acids are NOT DETECTED.  The SARS-CoV-2 RNA is generally detectable in upper respiratory specimens during the acute phase of infection. The lowest concentration of SARS-CoV-2 viral copies this assay can detect is 138 copies/mL. A negative result does not preclude SARS-Cov-2 infection and should not be used as the sole basis for treatment or other patient management decisions. A negative result may occur with  improper specimen collection/handling, submission of specimen other than nasopharyngeal swab, presence of viral mutation(s) within the areas targeted by this assay, and inadequate number of viral copies(<138 copies/mL). A negative result must be combined with clinical observations, patient history, and epidemiological information. The expected result is Negative.  Fact Sheet for Patients:  EntrepreneurPulse.com.au  Fact Sheet for Healthcare Providers:  IncredibleEmployment.be  This test is no t yet approved or cleared by the [place] FDA and  has been authorized for detection and/or diagnosis of SARS-CoV-2 by FDA under an Emergency Use Authorization (EUA). This EUA will remain  in effect (meaning this test can be used) for the duration of the COVID-19 declaration under Section 564(b)(1) of the Act, 21 U.S.C.section 360bbb-3(b)(1), unless the authorization is terminated  or revoked sooner.       Influenza A by PCR NEGATIVE NEGATIVE Final   Influenza B by PCR NEGATIVE NEGATIVE Final    Comment: (NOTE) The Xpert Xpress SARS-CoV-2/FLU/RSV plus assay is intended as an aid in the diagnosis of influenza from Nasopharyngeal swab specimens and should not be  used as a sole basis for treatment. Nasal washings and aspirates are unacceptable for Xpert Xpress SARS-CoV-2/FLU/RSV testing.  Fact Sheet for Patients: EntrepreneurPulse.com.au  Fact Sheet for Healthcare Providers: IncredibleEmployment.be  This test is not yet approved or cleared by the [place] FDA and has been authorized for detection and/or diagnosis of SARS-CoV-2 by FDA under an Emergency Use Authorization (EUA). This EUA will remain in effect (meaning this test can be used) for the duration of the COVID-19 declaration under Section 564(b)(1) of the Act, 21 U.S.C. section 360bbb-3(b)(1), unless the authorization is terminated or revoked.  Performed at [facility], [place]  [address], [place], [place] [address]   Urine Culture     Status: None   Collection Time: 12/16/20  6:05 PM   Specimen: Urine, Random  Result Value Ref Range Status   Specimen Description   Final    URINE, RANDOM Performed at [facility], [address], [place], [place] [address]    Special Requests   Final    NONE Performed at [facility], [address] [place], [place] [address]    Culture   Final    NO GROWTH Performed at [facility], [place] [address] [place], [place] [address]    Report Status 12/18/2020 FINAL  Final  Anaerobic culture w Gram Stain     Status: None (Preliminary result)   Collection Time: 12/16/20  7:37 PM   Specimen: Urine, Cystoscope  Result Value Ref Range Status   Specimen Description   Final    URINE, CATHETERIZED LEFT RENAL PELVIS Performed at [facility], [place] [address], [place], [place] [address]    Special Requests   Final    NONE Performed at [facility], [place], [place] [address]    Gram Stain   Final    FEW WBC PRESENT, PREDOMINANTLY PMN MODERATE GRAM NEGATIVE RODS Performed at [facility], [place] [address] [place], [place] [address]     Culture   Final    NO ANAEROBES ISOLATED; CULTURE IN PROGRESS FOR 5 DAYS   Report Status PENDING  Incomplete  Urine culture     Status: Abnormal (Preliminary result)   Collection Time: 12/16/20  7:56 PM   Specimen: Urine, Catheterized  Result Value Ref Range Status   Specimen Description   Final    URINE, CATHETERIZED LEFT RENAL PELVIS Performed at [facility], [place] [address] [place], [place] [address]    Special Requests   Final    NONE Performed at [facility], [address] [place], [place] [address]    Culture (A)  Final    >=100,000 COLONIES/mL STENOTROPHOMONAS MALTOPHILIA MULTI-DRUG RESISTANT ORGANISM RESULT CALLED TO, READ BACK BY AND VERIFIED WITH: DR [facility] ON [id number] AT 0830 BY E.PARRISH Sent to [facility] for further susceptibility testing. Performed at [facility], [place] [address], [place], [place] [address]    Report Status PENDING  Incomplete   Organism ID, Bacteria STENOTROPHOMONAS MALTOPHILIA (A)  Final      Susceptibility   Stenotrophomonas maltophilia - MIC*    LEVOFLOXACIN >=8 RESISTANT Resistant     TRIMETH/SULFA >=320 RESISTANT Resistant     * >=100,000 COLONIES/mL STENOTROPHOMONAS MALTOPHILIA     Radiology Studies: No results found.  Scheduled Meds: . acidophilus  1 capsule Oral Daily  . aspirin EC  81 mg Oral Daily  . Chlorhexidine Gluconate Cloth  6 each Topical Daily  . cholecalciferol  1,000 Units Oral Daily  . docusate sodium  100 mg Oral BID  . famotidine  20 mg Oral QHS  . heparin  5,000 Units Subcutaneous Q8H  . losartan  25 mg Oral Daily   And  . hydrochlorothiazide  6.25 mg Oral Daily  . insulin aspart  0-6 Units Subcutaneous TID WC  . Netarsudil-Latanoprost  1 drop Ophthalmic Daily  . pantoprazole  40 mg Oral QAC breakfast  . simvastatin  20 mg Oral QHS  . tamsulosin  0.4 mg Oral Daily   Continuous Infusions: . sodium chloride Stopped (12/18/20 2330)  . aztreonam 2 g (12/21/20  3009)  . ceftazidime  avibactam (AVYCAZ) IVPB 0.94 g (12/21/20 1029)     LOS: 5 days   Time spent: 30 minutes. More than 50% of the time was spent in counseling/coordination of care  Lorella Nimrod, MD [facility]  If 7PM-7AM, please contact night-coverage [email or web address]  12/21/2020, 1:30 PM   This record has been created using Systems analyst. Errors have been sought and corrected,but may not always be located. Such creation errors do not reflect on the standard of care.

## 2020-12-21 NOTE — Plan of Care (Signed)

## 2020-12-21 NOTE — Consult Note (Signed)
[name]       Reason for Consult: multidrug resistant Stenotrophomonas    Referring Physician: Dr. Reesa Chew  Active Problems:   Kidney stone   Pyelonephritis   Acute cystitis with hematuria   AKI (acute kidney injury) ([place])   . acidophilus  1 capsule Oral Daily  . aspirin EC  81 mg Oral Daily  . Chlorhexidine Gluconate Cloth  6 each Topical Daily  . cholecalciferol  1,000 Units Oral Daily  . docusate sodium  100 mg Oral BID  . famotidine  20 mg Oral QHS  . heparin  5,000 Units Subcutaneous Q8H  . [START ON 12/22/2020] losartan  12.5 mg Oral Daily   And  . [START ON 12/22/2020] hydrochlorothiazide  6.25 mg Oral Daily  . insulin aspart  0-6 Units Subcutaneous TID WC  . Netarsudil-Latanoprost  1 drop Ophthalmic Daily  . pantoprazole  40 mg Oral QAC breakfast  . simvastatin  20 mg Oral QHS  . 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   Sensitivities sent for other options, though will be likely 1-2 weeks to return.  He will need to continue with IV therapy inpatient until stents/stones removed  Antibiotics: Levofloxacin Day 3 aztreonam + ceftazidime/avibactam  HPI: David Myers is a 87 y.o. male with a history of DM, CVA, OSA though does not use prescribed CPAP who underwent TURP for BPH with LU TS in April of this year and subsequently with fever and malaise after that.  A urine sample was positive for Stenotrophomonas maltophilia and sensitive only to levofloxacin, which he was given for 10 days. After finishing his antibiotics, he  again developed fever up to 100.7 associated with urgency, frequency and lower abdominal pain and was seen by the urology office again.  A CT was done and noted two renal calucli, 7 and 4 mm in size in the distal left ureter and a loarge lower pole stone on the left with resultant hydronephrosis.  His creat also was elevated and was admitted for management which has included urgent stent placement with the plan to treat with antibiotics for 2 weeks prior to addressing the stones.  His urine was sent again prior to this and was the same Stenotrophomonas, though pan-resistant, including to levofloxacin.  Urine culture on 5/18 at the time of the stents was negative, though a catheterized sample of the left renal pelvis has moderate gram negative rods on gram stain.  I was consulted due to the highly resistant nature of the organism.     Review of Systems:  Constitutional: positive for fatigue and malaise or negative for fevers and chills Gastrointestinal: negative for diarrhea Integument/breast: negative for rash All other systems reviewed and are negative    Past Medical History:  Diagnosis Date  . Arthritis   . Diabetes mellitus without complication (HCC)    diet controlled  . GERD (gastroesophageal reflux disease)   . History of hiatal hernia   . History of kidney stones   . Hypertension   . Sleep apnea    does not uses CPAP machine for many years   .  Stroke [facility])    2018    Social History   Tobacco Use  . Smoking status: Never Smoker  . Smokeless tobacco: Never Used  Vaping Use  . Vaping Use: Never used  Substance Use Topics  . Alcohol use: No  . Drug use: Never    Family History  Problem Relation Age of Onset  . Hypertension Mother     Allergies  Allergen Reactions  . Augmentin [Amoxicillin-Pot Clavulanate] Diarrhea    Led to C-diff     Physical Exam: Constitutional: in no apparent distress  Vitals:   12/21/20 1221 12/21/20 1523  BP: 102/62 98/67  Pulse: 74 78   Resp: 17 18  Temp: 98 F (36.7 C) 98.8 F (37.1 C)  SpO2: 96% 92%   EYES: anicteric Cardiovascular: Cor RRR Respiratory: clear; Musculoskeletal: no pedal edema noted Skin: negatives: no rash Neuro: non-focal  Lab Results  Component Value Date   WBC 7.7 12/20/2020   HGB 10.9 (L) 12/20/2020   HCT 32.1 (L) 12/20/2020   MCV 90.2 12/20/2020   PLT 276 12/20/2020    Lab Results  Component Value Date   CREATININE 1.68 (H) 12/20/2020   BUN 37 (H) 12/20/2020   NA 137 12/20/2020   K 4.0 12/20/2020   CL 102 12/20/2020   CO2 25 12/20/2020    Lab Results  Component Value Date   ALT 20 06/09/2018   AST 23 06/09/2018   ALKPHOS 49 06/09/2018     Microbiology: Recent Results (from the past 240 hour(s))  Microscopic Examination     Status: Abnormal   Collection Time: 12/16/20  2:35 PM   Urine  Result Value Ref Range Status   WBC, UA 11-30 (A) 0 - 5 /hpf Final   RBC >30 (H) 0 - 2 /hpf Final   Epithelial Cells (non renal) 0-10 0 - 10 /hpf Final   Casts Present (A) None seen /lpf Final   Cast Type Hyaline casts N/A Final   Bacteria, UA None seen None seen/Few Final  CULTURE, URINE COMPREHENSIVE     Status: None   Collection Time: 12/16/20  4:29 PM   Specimen: Urine   UR  Result Value Ref Range Status   Urine Culture, Comprehensive Final report  Final   Organism ID, Bacteria Comment  Final    Comment: Mixed urogenital flora 2,000 Colonies/mL   Blood culture (routine x 2)     Status: None (Preliminary result)   Collection Time: 12/16/20  4:39 PM   Specimen: BLOOD  Result Value Ref Range Status   Specimen Description BLOOD RIGHT ANTECUBITAL  Final   Special Requests   Final    BOTTLES DRAWN AEROBIC AND ANAEROBIC Blood Culture adequate volume   Culture   Final    NO GROWTH 4 DAYS Performed at [facility], [address], [place], [place] [address]    Report Status PENDING  Incomplete  Resp Panel by RT-PCR (Flu A&B, Covid) Nasopharyngeal Swab     Status: None    Collection Time: 12/16/20  6:05 PM   Specimen: Nasopharyngeal Swab; Nasopharyngeal(NP) swabs in vial transport medium  Result Value Ref Range Status   SARS Coronavirus 2 by RT PCR NEGATIVE NEGATIVE Final    Comment: (NOTE) SARS-CoV-2 target nucleic acids are NOT DETECTED.  The SARS-CoV-2 RNA is generally detectable in upper respiratory specimens during the acute phase of infection. The lowest concentration of SARS-CoV-2 viral copies this assay can detect is 138 copies/mL. A negative result does not preclude  SARS-Cov-2 infection and should not be used as the sole basis for treatment or other patient management decisions. A negative result may occur with  improper specimen collection/handling, submission of specimen other than nasopharyngeal swab, presence of viral mutation(s) within the areas targeted by this assay, and inadequate number of viral copies(<138 copies/mL). A negative result must be combined with clinical observations, patient history, and epidemiological information. The expected result is Negative.  Fact Sheet for Patients:  EntrepreneurPulse.com.au  Fact Sheet for Healthcare Providers:  IncredibleEmployment.be  This test is no t yet approved or cleared by the [place] FDA and  has been authorized for detection and/or diagnosis of SARS-CoV-2 by FDA under an Emergency Use Authorization (EUA). This EUA will remain  in effect (meaning this test can be used) for the duration of the COVID-19 declaration under Section 564(b)(1) of the Act, 21 U.S.C.section 360bbb-3(b)(1), unless the authorization is terminated  or revoked sooner.       Influenza A by PCR NEGATIVE NEGATIVE Final   Influenza B by PCR NEGATIVE NEGATIVE Final    Comment: (NOTE) The Xpert Xpress SARS-CoV-2/FLU/RSV plus assay is intended as an aid in the diagnosis of influenza from Nasopharyngeal swab specimens and should not be used as a sole basis for treatment.  Nasal washings and aspirates are unacceptable for Xpert Xpress SARS-CoV-2/FLU/RSV testing.  Fact Sheet for Patients: EntrepreneurPulse.com.au  Fact Sheet for Healthcare Providers: IncredibleEmployment.be  This test is not yet approved or cleared by the [place] FDA and has been authorized for detection and/or diagnosis of SARS-CoV-2 by FDA under an Emergency Use Authorization (EUA). This EUA will remain in effect (meaning this test can be used) for the duration of the COVID-19 declaration under Section 564(b)(1) of the Act, 21 U.S.C. section 360bbb-3(b)(1), unless the authorization is terminated or revoked.  Performed at [facility], [address] [place], [place] [address]   Urine Culture     Status: None   Collection Time: 12/16/20  6:05 PM   Specimen: Urine, Random  Result Value Ref Range Status   Specimen Description   Final    URINE, RANDOM Performed at [facility], [address], [place], [place] [address]    Special Requests   Final    NONE Performed at [facility], [address] [place], [place] [address]    Culture   Final    NO GROWTH Performed at [facility], [place] [address], [place], [place] [address]    Report Status 12/18/2020 FINAL  Final  Anaerobic culture w Gram Stain     Status: None (Preliminary result)   Collection Time: 12/16/20  7:37 PM   Specimen: Urine, Cystoscope  Result Value Ref Range Status   Specimen Description   Final    URINE, CATHETERIZED LEFT RENAL PELVIS Performed at [facility], [place] [address], [place], [place] [address]    Special Requests   Final    NONE Performed at [facility], [place], [place] [address]    Gram Stain   Final    FEW WBC PRESENT, PREDOMINANTLY PMN MODERATE GRAM NEGATIVE RODS Performed at [facility], [place] [address], [place], [place] [address]    Culture   Final    NO ANAEROBES  ISOLATED; CULTURE IN PROGRESS FOR 5 DAYS   Report Status PENDING  Incomplete  Urine culture     Status: Abnormal (Preliminary result)   Collection Time: 12/16/20  7:56 PM  Specimen: Urine, Catheterized  Result Value Ref Range Status   Specimen Description   Final    URINE, CATHETERIZED LEFT RENAL PELVIS Performed at [facility], [place] [address], [place], [place] [address]    Special Requests   Final    NONE Performed at [facility], [address] [place], [place] [address]    Culture (A)  Final    >=100,000 COLONIES/mL STENOTROPHOMONAS MALTOPHILIA MULTI-DRUG RESISTANT ORGANISM RESULT CALLED TO, READ BACK BY AND VERIFIED WITH: DR [facility] ON [id number] AT 0830 BY E.PARRISH Sent to [facility] for further susceptibility testing. Performed at [facility], [place] [address], [place], [place] [address]    Report Status PENDING  Incomplete   Organism ID, Bacteria STENOTROPHOMONAS MALTOPHILIA (A)  Final      Susceptibility   Stenotrophomonas maltophilia - MIC*    LEVOFLOXACIN >=8 RESISTANT Resistant     TRIMETH/SULFA >=320 RESISTANT Resistant     * >=100,000 COLONIES/mL STENOTROPHOMONAS MALTOPHILIA    Thayer Headings, MD [facility] [email or web address] 12/21/2020, 3:47 PM

## 2020-12-21 NOTE — Care Management Important Message (Signed)
Important Message  Patient Details  Name: David Myers MRN: [id number] Date of Birth: [birth year]   Medicare Important Message Given:  Yes     Dannette Barbara 12/21/2020, 12:06 PM

## 2020-12-22 DIAGNOSIS — N200 Calculus of kidney: Secondary | ICD-10-CM | POA: Diagnosis not present

## 2020-12-22 DIAGNOSIS — N3001 Acute cystitis with hematuria: Secondary | ICD-10-CM | POA: Diagnosis not present

## 2020-12-22 DIAGNOSIS — N12 Tubulo-interstitial nephritis, not specified as acute or chronic: Secondary | ICD-10-CM | POA: Diagnosis not present

## 2020-12-22 DIAGNOSIS — N179 Acute kidney failure, unspecified: Secondary | ICD-10-CM | POA: Diagnosis not present

## 2020-12-22 LAB — ANAEROBIC CULTURE W GRAM STAIN

## 2020-12-22 LAB — BASIC METABOLIC PANEL
Anion gap: 9 (ref 5–15)
BUN: 42 mg/dL — ABNORMAL HIGH (ref 8–23)
CO2: 23 mmol/L (ref 22–32)
Calcium: 8.8 mg/dL — ABNORMAL LOW (ref 8.9–10.3)
Chloride: 103 mmol/L (ref 98–111)
Creatinine, Ser: 1.57 mg/dL — ABNORMAL HIGH (ref 0.61–1.24)
GFR, Estimated: 42 mL/min — ABNORMAL LOW (ref >60)
Glucose, Bld: 115 mg/dL — ABNORMAL HIGH (ref 70–99)
Potassium: 4.3 mmol/L (ref 3.5–5.1)
Sodium: 135 mmol/L (ref 135–145)

## 2020-12-22 LAB — CBC
HCT: 33.1 % — ABNORMAL LOW (ref 39.0–52.0)
Hemoglobin: 11.6 g/dL — ABNORMAL LOW (ref 13.0–17.0)
MCH: 31.4 pg (ref 26.0–34.0)
MCHC: 35 g/dL (ref 30.0–36.0)
MCV: 89.7 fL (ref 80.0–100.0)
Platelets: 304 10*3/uL (ref 150–400)
RBC: 3.69 MIL/uL — ABNORMAL LOW (ref 4.22–5.81)
RDW: 12.7 % (ref 11.5–15.5)
WBC: 8.3 10*3/uL (ref 4.0–10.5)
nRBC: 0 % (ref 0.0–0.2)

## 2020-12-22 LAB — MISC LABCORP TEST (SEND OUT)
LabCorp test name: 4
Labcorp test code: 88013

## 2020-12-22 LAB — GLUCOSE, CAPILLARY
Glucose-Capillary: 121 mg/dL — ABNORMAL HIGH (ref 70–99)
Glucose-Capillary: 157 mg/dL — ABNORMAL HIGH (ref 70–99)
Glucose-Capillary: 122 mg/dL — ABNORMAL HIGH (ref 70–99)
Glucose-Capillary: 119 mg/dL — ABNORMAL HIGH (ref 70–99)

## 2020-12-22 MED ORDER — SODIUM CHLORIDE 0.9 % IV SOLN
2.0000 g | Freq: Three times a day (TID) | INTRAVENOUS | Status: DC
  Administered 2020-12-22 – 2020-12-30 (×24): 2 g via INTRAVENOUS
  Filled 2020-12-22 (×26): qty 2

## 2020-12-22 MED ORDER — SODIUM CHLORIDE 0.9 % IV BOLUS
500.0000 mL | Freq: Once | INTRAVENOUS | Status: AC
  Administered 2020-12-22: 500 mL via INTRAVENOUS

## 2020-12-22 MED ORDER — DEXTROSE 5 % IV SOLN
1.2500 g | Freq: Three times a day (TID) | INTRAVENOUS | Status: DC
  Administered 2020-12-22 – 2020-12-30 (×24): 1.25 g via INTRAVENOUS
  Filled 2020-12-22 (×34): qty 6

## 2020-12-22 NOTE — Progress Notes (Addendum)
PROGRESS NOTE    David Myers  EUM:[id number] DOB: Apr 01, 1932 DOA: 12/16/2020 PCP: Rusty Aus, MD   Brief Narrative: Taken from H&P. David Myers is an 87 y.o. male who is status post TURP in 10/2020 by Dr Bernardo Heater.  Other comorbidities include diabetes mellitus, kidney stones, history of stroke,obstructive sleep apnea not compliant with CPAP.  As per wife, post TURP, patient has been having recurrent UTI/prostatitis and has been on and off antibiotics for several weeks.  He was last on levofloxacin for treatment of recurrent UTI but continued to have persistent fever with associated lower abdominal discomfort.  He went back to see urology today, urinalysis was obtained in the office and was suggestive of UTI.  Stat CT scan was requested by urology and result came back suggesting new left hydronephrosis with obstructive nephrolithiasis s/p cystoscopy and stent placement. Repeat urine cultures pending.  Patient was started on levofloxacin based on prior urine cultures. Repeat blood cultures even worsening of MDR, also resistant to Levaquin.  ID was consulted and they requested micro lab to test susceptibility for minocycline/tetracycline, aztreonam and Cholistin. Antibiotic switched with Avycaz and aztreonam while waiting for further susceptibility results, patient will stay in hospital for another week on these antibiotics per ID as they cannot be given at home. Sent message to urology to expedite urologic procedure for lithotripsy and stent removal for better control of infection as requested by ID. Received a message from urology that they will do his procedure on 12/29/20  Subjective: Patient was seen and examined today.  No new complaint.  Wife at bedside.  He was little disappointed that he cannot go home but seems understanding and cooperative.  Assessment & Plan:   Active Problems:   Kidney stone   Pyelonephritis   Acute cystitis with hematuria   AKI (acute kidney  injury) ([place])  Acute left pyelonephritis.  CT abdomen with evidence of obstructive nephrolithiasis s/p cystoscopy and stent placement by urology.  Patient will need outpatient follow-up with urology for lithotripsy and further management.  History of multiple drug-resistant UTI only sensitive to levofloxacin on prior culture results.  Patient did received 1 week of levo but continued to experience fever.  Remained afebrile with improvement in leukocytosis since admission. Urine culture growing stenotrophomonas maltophilia-MDR, no shows resistant to Levaquin too. Microlab was requested for further susceptibility as requested by ID -Start him on Avycaz and aztreonam as advised by ID-we will guide therapy once more susceptibility results are available, most likely will take many days. -Follow-up urine culture for final recommendations of antibiotics-still pending -ID is recommending expediting urologic procedure for better control of infection.  AKI with CKD stage IV.  Most likely secondary to obstruction.  Creatinine started improving. -Monitor renal function -Avoid nephrotoxins  Hypertension.  Blood pressure within goal. -Continue home meds  Objective: Vitals:   12/22/20 0113 12/22/20 0505 12/22/20 0755 12/22/20 1225  BP: 98/67 99/68 109/74 106/77  Pulse: 69 70 62 65  Resp:  16 18 16   Temp:  98 F (36.7 C) 97.9 F (36.6 C) 98.1 F (36.7 C)  TempSrc:      SpO2:  94% 91% 93%  Weight:      Height:        Intake/Output Summary (Last 24 hours) at 12/22/2020 1534 Last data filed at 12/22/2020 1517 Gross per 24 hour  Intake 1106.04 ml  Output 1600 ml  Net -493.96 ml   Filed Weights   12/16/20 2200  Weight: 72.4 kg  Examination:  General.  Pleasant elderly man, in no acute distress. Pulmonary.  Lungs clear bilaterally, normal respiratory effort. CV.  Regular rate and rhythm, no JVD, rub or murmur. Abdomen.  Soft, nontender, nondistended, BS positive. CNS.  Alert and oriented  x3.  No focal neurologic deficit. Extremities.  No edema, no cyanosis, pulses intact and symmetrical. Psychiatry.  Judgment and insight appears normal.  DVT prophylaxis: Heparin Code Status: Full Family Communication: Wife was updated at bed site. Disposition Plan:  Status is: Inpatient  Remains inpatient appropriate because:Inpatient level of care appropriate due to severity of illness   Dispo: The patient is from: Home              Anticipated d/c is to: Home              Patient currently is not medically stable to d/c.   Difficult to place patient No             Level of care: Med-Surg  All the records are reviewed and case discussed with Care Management/Social Worker. Management plans discussed with the patient, nursing and they are in agreement.  Consultants:   Urology  ID  Procedures:  Cystoscopy with urethral stent placement  Antimicrobials:  Avycaz Aztreonam  Data Reviewed: I have personally reviewed following labs and imaging studies  CBC: Recent Labs  Lab 12/16/20 1533 12/16/20 1639 12/17/20 0528 12/18/20 0439 12/19/20 0813 12/20/20 0539 12/22/20 0538  WBC 11.9*   < > 8.2 9.1 9.3 7.7 8.3  NEUTROABS 10.1*  --   --   --   --   --   --   HGB 11.5*   < > 11.1* 10.7* 11.7* 10.9* 11.6*  HCT 33.6*   < > 32.5* 30.8* 33.7* 32.1* 33.1*  MCV 92.3   < > 92.1 91.4 90.3 90.2 89.7  PLT 284   < > 262 276 288 276 304   < > = values in this interval not displayed.   Basic Metabolic Panel: Recent Labs  Lab 12/16/20 1639 12/17/20 0528 12/18/20 0439 12/20/20 0539 12/22/20 0538  NA 131* 134* 137 137 135  K 4.6 4.6 4.1 4.0 4.3  CL 95* 101 102 102 103  CO2 23 23 25 25 23   GLUCOSE 143* 203* 123* 132* 115*  BUN 32* 31* 33* 37* 42*  CREATININE 2.58* 2.05* 1.80* 1.68* 1.57*  CALCIUM 9.3 8.6* 9.0 8.8* 8.8*   GFR: Estimated Creatinine Clearance: 31 mL/min (A) (by C-G formula based on SCr of 1.57 mg/dL (H)). Liver Function Tests: No results for input(s): AST,  ALT, ALKPHOS, BILITOT, PROT, ALBUMIN in the last 168 hours. No results for input(s): LIPASE, AMYLASE in the last 168 hours. No results for input(s): AMMONIA in the last 168 hours. Coagulation Profile: No results for input(s): INR, PROTIME in the last 168 hours. Cardiac Enzymes: No results for input(s): CKTOTAL, CKMB, CKMBINDEX, TROPONINI in the last 168 hours. BNP (last 3 results) No results for input(s): PROBNP in the last 8760 hours. HbA1C: No results for input(s): HGBA1C in the last 72 hours. CBG: Recent Labs  Lab 12/21/20 1126 12/21/20 1658 12/21/20 2124 12/22/20 0759 12/22/20 1227  GLUCAP 148* 121* 157* 122* 119*   Lipid Profile: No results for input(s): CHOL, HDL, LDLCALC, TRIG, CHOLHDL, LDLDIRECT in the last 72 hours. Thyroid Function Tests: No results for input(s): TSH, T4TOTAL, FREET4, T3FREE, THYROIDAB in the last 72 hours. Anemia Panel: No results for input(s): VITAMINB12, FOLATE, FERRITIN, TIBC, IRON, RETICCTPCT in the last  72 hours. Sepsis Labs: Recent Labs  Lab 12/16/20 1533 12/16/20 1639  LATICACIDVEN 1.2 1.5    Recent Results (from the past 240 hour(s))  Microscopic Examination     Status: Abnormal   Collection Time: 12/16/20  2:35 PM   Urine  Result Value Ref Range Status   WBC, UA 11-30 (A) 0 - 5 /hpf Final   RBC >30 (H) 0 - 2 /hpf Final   Epithelial Cells (non renal) 0-10 0 - 10 /hpf Final   Casts Present (A) None seen /lpf Final   Cast Type Hyaline casts N/A Final   Bacteria, UA None seen None seen/Few Final  CULTURE, URINE COMPREHENSIVE     Status: None   Collection Time: 12/16/20  4:29 PM   Specimen: Urine   UR  Result Value Ref Range Status   Urine Culture, Comprehensive Final report  Final   Organism ID, Bacteria Comment  Final    Comment: Mixed urogenital flora 2,000 Colonies/mL   Blood culture (routine x 2)     Status: None   Collection Time: 12/16/20  4:39 PM   Specimen: BLOOD  Result Value Ref Range Status   Specimen Description  BLOOD RIGHT ANTECUBITAL  Final   Special Requests   Final    BOTTLES DRAWN AEROBIC AND ANAEROBIC Blood Culture adequate volume   Culture   Final    NO GROWTH 5 DAYS Performed at [facility], [address] [place], [place] [address]    Report Status 12/21/2020 FINAL  Final  Resp Panel by RT-PCR (Flu A&B, Covid) Nasopharyngeal Swab     Status: None   Collection Time: 12/16/20  6:05 PM   Specimen: Nasopharyngeal Swab; Nasopharyngeal(NP) swabs in vial transport medium  Result Value Ref Range Status   SARS Coronavirus 2 by RT PCR NEGATIVE NEGATIVE Final    Comment: (NOTE) SARS-CoV-2 target nucleic acids are NOT DETECTED.  The SARS-CoV-2 RNA is generally detectable in upper respiratory specimens during the acute phase of infection. The lowest concentration of SARS-CoV-2 viral copies this assay can detect is 138 copies/mL. A negative result does not preclude SARS-Cov-2 infection and should not be used as the sole basis for treatment or other patient management decisions. A negative result may occur with  improper specimen collection/handling, submission of specimen other than nasopharyngeal swab, presence of viral mutation(s) within the areas targeted by this assay, and inadequate number of viral copies(<138 copies/mL). A negative result must be combined with clinical observations, patient history, and epidemiological information. The expected result is Negative.  Fact Sheet for Patients:  EntrepreneurPulse.com.au  Fact Sheet for Healthcare Providers:  IncredibleEmployment.be  This test is no t yet approved or cleared by the [place] FDA and  has been authorized for detection and/or diagnosis of SARS-CoV-2 by FDA under an Emergency Use Authorization (EUA). This EUA will remain  in effect (meaning this test can be used) for the duration of the COVID-19 declaration under Section 564(b)(1) of the Act, 21 U.S.C.section 360bbb-3(b)(1),  unless the authorization is terminated  or revoked sooner.       Influenza A by PCR NEGATIVE NEGATIVE Final   Influenza B by PCR NEGATIVE NEGATIVE Final    Comment: (NOTE) The Xpert Xpress SARS-CoV-2/FLU/RSV plus assay is intended as an aid in the diagnosis of influenza from Nasopharyngeal swab specimens and should not be used as a sole basis for treatment. Nasal washings and aspirates are unacceptable for Xpert Xpress SARS-CoV-2/FLU/RSV testing.  Fact Sheet for Patients: EntrepreneurPulse.com.au  Fact  Sheet for Healthcare Providers: IncredibleEmployment.be  This test is not yet approved or cleared by the [place] and has been authorized for detection and/or diagnosis of SARS-CoV-2 by FDA under an Emergency Use Authorization (EUA). This EUA will remain in effect (meaning this test can be used) for the duration of the COVID-19 declaration under Section 564(b)(1) of the Act, 21 U.S.C. section 360bbb-3(b)(1), unless the authorization is terminated or revoked.  Performed at [facility], [address] [place], [place] [address]   Urine Culture     Status: None   Collection Time: 12/16/20  6:05 PM   Specimen: Urine, Random  Result Value Ref Range Status   Specimen Description   Final    URINE, RANDOM Performed at [facility], [address] [place], [place] [address]    Special Requests   Final    NONE Performed at [facility], [address] [place], [place] [address]    Culture   Final    NO GROWTH Performed at [facility], [place] [address], [place], [place] [address]    Report Status 12/18/2020 FINAL  Final  Anaerobic culture w Gram Stain     Status: None   Collection Time: 12/16/20  7:37 PM   Specimen: Urine, Cystoscope  Result Value Ref Range Status   Specimen Description   Final    URINE, CATHETERIZED LEFT RENAL PELVIS Performed at [facility], [place] [address]  [place], [place] [address]    Special Requests   Final    NONE Performed at [facility], [address] [place], [place] [address]    Gram Stain   Final    FEW WBC PRESENT, PREDOMINANTLY PMN MODERATE GRAM NEGATIVE RODS    Culture   Final    NO ANAEROBES ISOLATED Performed at [facility], [place] [address] [place], [place] [address]    Report Status 12/22/2020 FINAL  Final  Urine culture     Status: Abnormal (Preliminary result)   Collection Time: 12/16/20  7:56 PM   Specimen: Urine, Catheterized  Result Value Ref Range Status   Specimen Description   Final    URINE, CATHETERIZED LEFT RENAL PELVIS Performed at [facility] [address] [place], [place] [address]    Special Requests   Final    NONE Performed at [facility], [address] [place], [place] [address]    Culture (A)  Final    >=100,000 COLONIES/mL STENOTROPHOMONAS MALTOPHILIA MULTI-DRUG RESISTANT ORGANISM RESULT CALLED TO, READ BACK BY AND VERIFIED WITH: DR [facility] ON [id number] AT 0830 BY E.PARRISH Sent to [facility] for further susceptibility testing. Performed at [facility], [place] [address] [place],  [address]    Report Status PENDING  Incomplete   Organism ID, Bacteria STENOTROPHOMONAS MALTOPHILIA (A)  Final      Susceptibility   Stenotrophomonas maltophilia - MIC*    LEVOFLOXACIN >=8 RESISTANT Resistant     TRIMETH/SULFA >=320 RESISTANT Resistant     * >=100,000 COLONIES/mL STENOTROPHOMONAS MALTOPHILIA     Radiology Studies: No results found.  Scheduled Meds: . acidophilus  1 capsule Oral Daily  . aspirin EC  81 mg Oral Daily  . Chlorhexidine Gluconate Cloth  6 each Topical Daily  . cholecalciferol  1,000 Units Oral Daily  . docusate sodium  100 mg Oral BID  . famotidine  20 mg Oral QHS  . heparin  5,000 Units Subcutaneous Q8H  . losartan  12.5 mg Oral Daily   And  . hydrochlorothiazide  6.25 mg Oral Daily  . insulin aspart  0-6 Units Subcutaneous TID  WC  . Netarsudil-Latanoprost  1 drop Ophthalmic Daily  . pantoprazole  40 mg Oral QAC breakfast  . simvastatin  20 mg Oral QHS  . tamsulosin  0.4 mg Oral Daily   Continuous Infusions: . sodium chloride Stopped (12/22/20 0414)  . aztreonam    . ceftazidime avibactam (AVYCAZ) IVPB Stopped (12/22/20 1500)     LOS: 6 days   Time spent: 25 minutes. More than 50% of the time was spent in counseling/coordination of care  Lorella Nimrod, MD [facility]  If 7PM-7AM, please contact night-coverage [email or web address]  12/22/2020, 3:34 PM   This record has been created using Systems analyst. Errors have been sought and corrected,but may not always be located. Such creation errors do not reflect on the standard of care.

## 2020-12-22 NOTE — Consult Note (Addendum)
Pharmacy Antibiotic Note  David Myers is a 87 y.o. male with medical history including TURP 10/2020, diabetes, kidney stones, history of CVA, OSA, recurrent UTI / prostatitis since TURP admitted on 12/16/2020 with obstructive nephrolithasis / new left hydronephrosis. Urology performed urgent cystoscopy and stent placement on 5/18. Patient was empirically placed on LVQ for MDR Stenotrophomonas identified on 11/26/20 UCx which was only LVQ susceptible. Cultures from this admission with MDR Stenotrophomonas which is LVQ R and Bactrim R. ID has been consulted. Pharmacy has been consulted for Avycaz and aztreonam dosing.  per micro lab at [facility] - only susceptibilities for Bactrim and LVQ were reported by [facility]. Per ID recommendations, have requested susceptibilities for minocycline/tetracycline, aztreonam, and colistin to be sent to [facility].  This will take at least 7-10 days.    Today, 12/22/2020  Day #4 antiibotics  Renal function improving  WBC WNL   afebrile  Plan: Per improved renal function, adjust doses as below:   Avycaz 0.94g IV q8h (3-hr infusion) -- renally adjusted  Aztreonam 2 g IV q8h (3-hr infusion) -- renally adjusted  Antibiotics timed to be given simultaneously   Await further susceptibility testing to guide therapy  As per ID,  Will likely need to complete therapy as inpatient  Height: 5\' 7"  (170.2 cm) Weight: 72.4 kg (159 lb 9.8 oz) IBW/kg (Calculated) : 66.1  Temp (24hrs), Avg:98 F (36.7 C), Min:97.5 F (36.4 C), Max:98.8 F (37.1 C)  Recent Labs  Lab 12/16/20 1533 12/16/20 1639 12/17/20 0528 12/18/20 0439 12/19/20 0813 12/20/20 0539 12/22/20 0538  WBC 11.9* 11.6* 8.2 9.1 9.3 7.7 8.3  CREATININE 2.71* 2.58* 2.05* 1.80*  --  1.68* 1.57*  LATICACIDVEN 1.2 1.5  --   --   --   --   --     Estimated Creatinine Clearance: 31 mL/min (A) (by C-G formula based on SCr of 1.57 mg/dL (H)).    Allergies  Allergen Reactions  . Augmentin [Amoxicillin-Pot  Clavulanate] Diarrhea    Led to C-diff     Antimicrobials this admission: Levofloxacin 5/18 >> 5/20 Avycaz 5/21 >>  Aztreonam 5/21 >>   Dose adjustments this admission: N/A  Microbiology results: 5/18 BCx: NGTD 5/18 UCx: NG 5/18 UCx (L renal pelvis): MDR Stenotrophomonas maltophilia 5/18 UCx (L renal pelvis): GNR, pending   Thank you for allowing pharmacy to be a part of this patient's care.  Doreene Eland, PharmD, BCPS.   Work Cell: [phone] 12/22/2020 8:57 AM

## 2020-12-22 NOTE — Progress Notes (Signed)
[facility]   Reason for visit: Follow up on UTI  Interval History: wbc wnl, remains afebrile.  No associated rash or diarrhea.   Day 4 aztreonam + ceftaz/avibactam Wife at bedside  Physical Exam: Constitutional:  Vitals:   12/22/20 1225 12/22/20 1554  BP: 106/77 (!) 85/52  Pulse: 65 79  Resp: 16 17  Temp: 98.1 F (36.7 C) 97.8 F (36.6 C)  SpO2: 93% 93%   patient appears in NAD Respiratory: Normal respiratory effort; CTA B Cardiovascular: RRR GI: soft, nt, nd  Review of Systems: Constitutional: negative for fevers and chills Gastrointestinal: negative for nausea and diarrhea Integument/breast: negative for rash  Lab Results  Component Value Date   WBC 8.3 12/22/2020   HGB 11.6 (L) 12/22/2020   HCT 33.1 (L) 12/22/2020   MCV 89.7 12/22/2020   PLT 304 12/22/2020    Lab Results  Component Value Date   CREATININE 1.57 (H) 12/22/2020   BUN 42 (H) 12/22/2020   NA 135 12/22/2020   K 4.3 12/22/2020   CL 103 12/22/2020   CO2 23 12/22/2020    Lab Results  Component Value Date   ALT 20 06/09/2018   AST 23 06/09/2018   ALKPHOS 49 06/09/2018     Microbiology: Recent Results (from the past 240 hour(s))  Microscopic Examination     Status: Abnormal   Collection Time: 12/16/20  2:35 PM   Urine  Result Value Ref Range Status   WBC, UA 11-30 (A) 0 - 5 /hpf Final   RBC >30 (H) 0 - 2 /hpf Final   Epithelial Cells (non renal) 0-10 0 - 10 /hpf Final   Casts Present (A) None seen /lpf Final   Cast Type Hyaline casts N/A Final   Bacteria, UA None seen None seen/Few Final  CULTURE, URINE COMPREHENSIVE     Status: None   Collection Time: 12/16/20  4:29 PM   Specimen: Urine   UR  Result Value Ref Range Status   Urine Culture, Comprehensive Final report  Final   Organism ID, Bacteria Comment  Final    Comment: Mixed urogenital flora 2,000 Colonies/mL   Blood culture (routine x 2)     Status: None   Collection Time: 12/16/20  4:39 PM    Specimen: BLOOD  Result Value Ref Range Status   Specimen Description BLOOD RIGHT ANTECUBITAL  Final   Special Requests   Final    BOTTLES DRAWN AEROBIC AND ANAEROBIC Blood Culture adequate volume   Culture   Final    NO GROWTH 5 DAYS Performed at [facility], [address] [place], [place] [address]    Report Status 12/21/2020 FINAL  Final  Resp Panel by RT-PCR (Flu A&B, Covid) Nasopharyngeal Swab     Status: None   Collection Time: 12/16/20  6:05 PM   Specimen: Nasopharyngeal Swab; Nasopharyngeal(NP) swabs in vial transport medium  Result Value Ref Range Status   SARS Coronavirus 2 by RT PCR NEGATIVE NEGATIVE Final    Comment: (NOTE) SARS-CoV-2 target nucleic acids are NOT DETECTED.  The SARS-CoV-2 RNA is generally detectable in upper respiratory specimens during the acute phase of infection. The lowest concentration of SARS-CoV-2 viral copies this assay can detect is 138 copies/mL. A negative result does not preclude SARS-Cov-2 infection and should not be used as the sole basis for treatment or other patient management decisions. A negative result may occur with  improper specimen collection/handling, submission of specimen other than nasopharyngeal swab, presence  of viral mutation(s) within the areas targeted by this assay, and inadequate number of viral copies(<138 copies/mL). A negative result must be combined with clinical observations, patient history, and epidemiological information. The expected result is Negative.  Fact Sheet for Patients:  EntrepreneurPulse.com.au  Fact Sheet for Healthcare Providers:  IncredibleEmployment.be  This test is no t yet approved or cleared by the [place] FDA and  has been authorized for detection and/or diagnosis of SARS-CoV-2 by FDA under an Emergency Use Authorization (EUA). This EUA will remain  in effect (meaning this test can be used) for the duration of the COVID-19  declaration under Section 564(b)(1) of the Act, 21 U.S.C.section 360bbb-3(b)(1), unless the authorization is terminated  or revoked sooner.       Influenza A by PCR NEGATIVE NEGATIVE Final   Influenza B by PCR NEGATIVE NEGATIVE Final    Comment: (NOTE) The Xpert Xpress SARS-CoV-2/FLU/RSV plus assay is intended as an aid in the diagnosis of influenza from Nasopharyngeal swab specimens and should not be used as a sole basis for treatment. Nasal washings and aspirates are unacceptable for Xpert Xpress SARS-CoV-2/FLU/RSV testing.  Fact Sheet for Patients: EntrepreneurPulse.com.au  Fact Sheet for Healthcare Providers: IncredibleEmployment.be  This test is not yet approved or cleared by the [place] FDA and has been authorized for detection and/or diagnosis of SARS-CoV-2 by FDA under an Emergency Use Authorization (EUA). This EUA will remain in effect (meaning this test can be used) for the duration of the COVID-19 declaration under Section 564(b)(1) of the Act, 21 U.S.C. section 360bbb-3(b)(1), unless the authorization is terminated or revoked.  Performed at [facility], [address], [place], [place] [address]   Urine Culture     Status: None   Collection Time: 12/16/20  6:05 PM   Specimen: Urine, Random  Result Value Ref Range Status   Specimen Description   Final    URINE, RANDOM Performed at [facility], [address], [place], [place] [address]    Special Requests   Final    NONE Performed at [facility], [address], [place], [place] [address]    Culture   Final    NO GROWTH Performed at [facility], [place] [address], [place], [place] [address]    Report Status 12/18/2020 FINAL  Final  Anaerobic culture w Gram Stain     Status: None   Collection Time: 12/16/20  7:37 PM   Specimen: Urine, Cystoscope  Result Value Ref Range Status   Specimen Description   Final    URINE,  CATHETERIZED LEFT RENAL PELVIS Performed at [facility], [place] [address] [place], [place] [address]    Special Requests   Final    NONE Performed at [facility], [address] [place], [place] [address]    Gram Stain   Final    FEW WBC PRESENT, PREDOMINANTLY PMN MODERATE GRAM NEGATIVE RODS    Culture   Final    NO ANAEROBES ISOLATED Performed at [facility], [place] [address] [place], [place] [address]    Report Status 12/22/2020 FINAL  Final  Urine culture     Status: Abnormal (Preliminary result)   Collection Time: 12/16/20  7:56 PM   Specimen: Urine, Catheterized  Result Value Ref Range Status   Specimen Description   Final    URINE, CATHETERIZED LEFT RENAL PELVIS Performed at [facility], [place] [address], [place], [place] [address]    Special Requests  Final    NONE Performed at [facility], [address] [place], [place] [address]    Culture (A)  Final    >=100,000 COLONIES/mL STENOTROPHOMONAS MALTOPHILIA MULTI-DRUG RESISTANT ORGANISM RESULT CALLED TO, READ BACK BY AND VERIFIED WITH: DR Asante [facility] ON [id number] AT 0830 BY E.PARRISH Sent to [place] for further susceptibility testing. Performed at [facility], [place] [address], [place], [place] [address]    Report Status PENDING  Incomplete   Organism ID, Bacteria STENOTROPHOMONAS MALTOPHILIA (A)  Final      Susceptibility   Stenotrophomonas maltophilia - MIC*    LEVOFLOXACIN >=8 RESISTANT Resistant     TRIMETH/SULFA >=320 RESISTANT Resistant     * >=100,000 COLONIES/mL STENOTROPHOMONAS MALTOPHILIA    Impression/Plan:  1. MDR Stenotrophomonas infection - on above antibiotics due to resistance.  He overall is stable and tolerating with no apparent side effects.  Will plan for treatment through Wed 6/1 to assure clearance.    2.  Hydronephrosis/renal calculi - urology planning on stone extraction, stent removal on Tuesday, 5/31.  This was discussed with the patient by Dr.  Bernardo Heater today.  Will continue to follow peripherally

## 2020-12-23 DIAGNOSIS — N200 Calculus of kidney: Secondary | ICD-10-CM | POA: Diagnosis not present

## 2020-12-23 DIAGNOSIS — N179 Acute kidney failure, unspecified: Secondary | ICD-10-CM | POA: Diagnosis not present

## 2020-12-23 DIAGNOSIS — N12 Tubulo-interstitial nephritis, not specified as acute or chronic: Secondary | ICD-10-CM | POA: Diagnosis not present

## 2020-12-23 LAB — GLUCOSE, CAPILLARY
Glucose-Capillary: 135 mg/dL — ABNORMAL HIGH (ref 70–99)
Glucose-Capillary: 147 mg/dL — ABNORMAL HIGH (ref 70–99)
Glucose-Capillary: 123 mg/dL — ABNORMAL HIGH (ref 70–99)
Glucose-Capillary: 158 mg/dL — ABNORMAL HIGH (ref 70–99)

## 2020-12-23 NOTE — Progress Notes (Signed)
Urology Consult Follow Up  Subjective: Patient is resting comfortably in bed with his wife at his side.  VSS afebrile  No new labs this am.  Patient voiding clear, yellow urine with good output.    Anti-infectives: Anti-infectives (From admission, onward)   Start     Dose/Rate Route Frequency Ordered Stop   12/22/20 1200  aztreonam (AZACTAM) 2 g in sodium chloride 0.9 % 100 mL IVPB        2 g 33.3 mL/hr over 180 Minutes Intravenous Every 8 hours 12/22/20 0852     12/22/20 1200  ceftazidime-avibactam (AVYCAZ) 1.25 g in dextrose 5 % 50 mL IVPB        1.25 g 16.67 mL/hr over 3 Hours Intravenous Every 8 hours 12/22/20 0852     12/19/20 1230  ceftazidime-avibactam (AVYCAZ) 0.94 g in dextrose 5 % 50 mL IVPB  Status:  Discontinued        0.94 g 16.67 mL/hr over 3 Hours Intravenous Every 12 hours 12/19/20 1132 12/22/20 0852   12/19/20 1230  aztreonam (AZACTAM) 2 g in sodium chloride 0.9 % 100 mL IVPB  Status:  Discontinued        2 g 33.3 mL/hr over 180 Minutes Intravenous Every 12 hours 12/19/20 1132 12/22/20 0852   12/18/20 2245  levofloxacin (LEVAQUIN) IVPB 500 mg  Status:  Discontinued       "Followed by" Linked Group Details   500 mg 100 mL/hr over 60 Minutes Intravenous Every 48 hours 12/16/20 2147 12/19/20 1123   12/17/20 0000  cefTRIAXone (ROCEPHIN) 2 g in sodium chloride 0.9 % 100 mL IVPB  Status:  Discontinued        2 g 200 mL/hr over 30 Minutes Intravenous Every 24 hours 12/16/20 2110 12/16/20 2112   12/16/20 2245  levofloxacin (LEVAQUIN) IVPB 750 mg       "Followed by" Linked Group Details   750 mg 100 mL/hr over 90 Minutes Intravenous  Once 12/16/20 2147 12/16/20 2359   12/16/20 2115  levofloxacin (LEVAQUIN) IVPB 500 mg  Status:  Discontinued        500 mg 100 mL/hr over 60 Minutes Intravenous Every 24 hours 12/16/20 2112 12/16/20 2147   12/16/20 1745  cefTRIAXone (ROCEPHIN) 1 g in sodium chloride 0.9 % 100 mL IVPB        1 g 200 mL/hr over 30 Minutes Intravenous   Once 12/16/20 1742 12/16/20 1836      Current Facility-Administered Medications  Medication Dose Route Frequency Provider Last Rate Last Admin  . 0.9 %  sodium chloride infusion   Intravenous PRN Lorella Nimrod, MD   Stopped at 12/22/20 0414  . acetaminophen (TYLENOL) tablet 650 mg  650 mg Oral Q6H PRN Acheampong, Warnell Bureau, MD       Or  . acetaminophen (TYLENOL) suppository 650 mg  650 mg Rectal Q6H PRN Acheampong, Warnell Bureau, MD      . acidophilus (RISAQUAD) capsule 1 capsule  1 capsule Oral Daily Lorella Nimrod, MD   1 capsule at 12/22/20 0935  . aspirin EC tablet 81 mg  81 mg Oral Daily Acheampong, Warnell Bureau, MD   81 mg at 12/22/20 0935  . aztreonam (AZACTAM) 2 g in sodium chloride 0.9 % 100 mL IVPB  2 g Intravenous Q8H Berton Mount, RPH 33.3 mL/hr at 12/23/20 0527 2 g at 12/23/20 0527  . bisacodyl (DULCOLAX) EC tablet 5 mg  5 mg Oral Daily PRN Acheampong, Warnell Bureau, MD   5 mg at  12/18/20 2148  . ceftazidime-avibactam (AVYCAZ) 1.25 g in dextrose 5 % 50 mL IVPB  1.25 g Intravenous Q8H Berton Mount, RPH 16.67 mL/hr at 12/23/20 0528 1.25 g at 12/23/20 0528  . Chlorhexidine Gluconate Cloth 2 % PADS 6 each  6 each Topical Daily Lorella Nimrod, MD   6 each at 12/22/20 0935  . cholecalciferol (VITAMIN D3) tablet 1,000 Units  1,000 Units Oral Daily Acheampong, Warnell Bureau, MD   1,000 Units at 12/22/20 0935  . docusate sodium (COLACE) capsule 100 mg  100 mg Oral BID Artist Beach, MD   100 mg at 12/22/20 2228  . famotidine (PEPCID) tablet 20 mg  20 mg Oral QHS Lorella Nimrod, MD   20 mg at 12/22/20 2227  . heparin injection 5,000 Units  5,000 Units Subcutaneous Q8H Acheampong, Warnell Bureau, MD   5,000 Units at 12/23/20 0557  . insulin aspart (novoLOG) injection 0-6 Units  0-6 Units Subcutaneous TID WC Acheampong, Warnell Bureau, MD   1 Units at 12/20/20 1251  . Netarsudil-Latanoprost 0.02-0.005 % SOLN 1 drop  1 drop Ophthalmic Daily Acheampong, Warnell Bureau, MD   1 drop at 12/22/20 2233  . ondansetron (ZOFRAN) tablet  4 mg  4 mg Oral Q6H PRN Acheampong, Warnell Bureau, MD       Or  . ondansetron (ZOFRAN) injection 4 mg  4 mg Intravenous Q6H PRN Acheampong, Warnell Bureau, MD      . pantoprazole (PROTONIX) EC tablet 40 mg  40 mg Oral QAC breakfast Acheampong, Warnell Bureau, MD   40 mg at 12/22/20 0935  . simvastatin (ZOCOR) tablet 20 mg  20 mg Oral QHS Artist Beach, MD   20 mg at 12/22/20 2227  . tamsulosin (FLOMAX) capsule 0.4 mg  0.4 mg Oral Daily Acheampong, Warnell Bureau, MD   0.4 mg at 12/22/20 0935     Objective: Vital signs in last 24 hours: Temp:  [97.3 F (36.3 C)-98.4 F (36.9 C)] 97.6 F (36.4 C) (05/25 0753) Pulse Rate:  [60-79] 60 (05/25 0753) Resp:  [15-18] 17 (05/25 0753) BP: (85-106)/(52-77) 104/69 (05/25 0753) SpO2:  [93 %-95 %] 94 % (05/25 0753)  Intake/Output from previous day: 05/24 0701 - 05/25 0700 In: 1346.2 [P.O.:600; IV Piggyback:746.2] Out: 1600 [Urine:1600] Intake/Output this shift: No intake/output data recorded.   Physical Exam Constitutional:  Well nourished. Alert and oriented, No acute distress. HEENT: [place] AT, moist mucus membranes.  Trachea midline Cardiovascular: No clubbing, cyanosis, or edema. Respiratory: Normal respiratory effort, no increased work of breathing. Neurologic: Grossly intact, no focal deficits, moving all 4 extremities. Psychiatric: Normal mood and affect.  Lab Results:  Recent Labs    12/22/20 0538  WBC 8.3  HGB 11.6*  HCT 33.1*  PLT 304   BMET Recent Labs    12/22/20 0538  NA 135  K 4.3  CL 103  CO2 23  GLUCOSE 115*  BUN 42*  CREATININE 1.57*  CALCIUM 8.8*   PT/INR No results for input(s): LABPROT, INR in the last 72 hours. ABG No results for input(s): PHART, HCO3 in the last 72 hours.  Invalid input(s): PCO2, PO2  Studies/Results: No results found.   Assessment: 87 year old male who underwent urgent left ureteral stent placement on Dec 16, 2020 for an infected obstructing distal ureteral calculi.  Continues to clinically  improve on empiric antibiotics.  Plan: -left ureteroscopy with laser lithotripsy with ureteral stent exchange on Dec 29, 2020 -Reviewed with the patient and his wife how this procedure is performed -  continue aztreonam + ceftaz/avibactam      LOS: 7 days    [facility] 12/23/2020

## 2020-12-23 NOTE — Plan of Care (Signed)

## 2020-12-23 NOTE — H&P (View-Only) (Signed)
Urology Consult Follow Up  Subjective: Patient is resting comfortably in bed with his wife at his side.  VSS afebrile  No new labs this am.  Patient voiding clear, yellow urine with good output.    Anti-infectives: Anti-infectives (From admission, onward)   Start     Dose/Rate Route Frequency Ordered Stop   12/22/20 1200  aztreonam (AZACTAM) 2 g in sodium chloride 0.9 % 100 mL IVPB        2 g 33.3 mL/hr over 180 Minutes Intravenous Every 8 hours 12/22/20 0852     12/22/20 1200  ceftazidime-avibactam (AVYCAZ) 1.25 g in dextrose 5 % 50 mL IVPB        1.25 g 16.67 mL/hr over 3 Hours Intravenous Every 8 hours 12/22/20 0852     12/19/20 1230  ceftazidime-avibactam (AVYCAZ) 0.94 g in dextrose 5 % 50 mL IVPB  Status:  Discontinued        0.94 g 16.67 mL/hr over 3 Hours Intravenous Every 12 hours 12/19/20 1132 12/22/20 0852   12/19/20 1230  aztreonam (AZACTAM) 2 g in sodium chloride 0.9 % 100 mL IVPB  Status:  Discontinued        2 g 33.3 mL/hr over 180 Minutes Intravenous Every 12 hours 12/19/20 1132 12/22/20 0852   12/18/20 2245  levofloxacin (LEVAQUIN) IVPB 500 mg  Status:  Discontinued       "Followed by" Linked Group Details   500 mg 100 mL/hr over 60 Minutes Intravenous Every 48 hours 12/16/20 2147 12/19/20 1123   12/17/20 0000  cefTRIAXone (ROCEPHIN) 2 g in sodium chloride 0.9 % 100 mL IVPB  Status:  Discontinued        2 g 200 mL/hr over 30 Minutes Intravenous Every 24 hours 12/16/20 2110 12/16/20 2112   12/16/20 2245  levofloxacin (LEVAQUIN) IVPB 750 mg       "Followed by" Linked Group Details   750 mg 100 mL/hr over 90 Minutes Intravenous  Once 12/16/20 2147 12/16/20 2359   12/16/20 2115  levofloxacin (LEVAQUIN) IVPB 500 mg  Status:  Discontinued        500 mg 100 mL/hr over 60 Minutes Intravenous Every 24 hours 12/16/20 2112 12/16/20 2147   12/16/20 1745  cefTRIAXone (ROCEPHIN) 1 g in sodium chloride 0.9 % 100 mL IVPB        1 g 200 mL/hr over 30 Minutes Intravenous   Once 12/16/20 1742 12/16/20 1836      Current Facility-Administered Medications  Medication Dose Route Frequency Provider Last Rate Last Admin  . 0.9 %  sodium chloride infusion   Intravenous PRN Lorella Nimrod, MD   Stopped at 12/22/20 0414  . acetaminophen (TYLENOL) tablet 650 mg  650 mg Oral Q6H PRN Acheampong, Warnell Bureau, MD       Or  . acetaminophen (TYLENOL) suppository 650 mg  650 mg Rectal Q6H PRN Acheampong, Warnell Bureau, MD      . acidophilus (RISAQUAD) capsule 1 capsule  1 capsule Oral Daily Lorella Nimrod, MD   1 capsule at 12/22/20 0935  . aspirin EC tablet 81 mg  81 mg Oral Daily Acheampong, Warnell Bureau, MD   81 mg at 12/22/20 0935  . aztreonam (AZACTAM) 2 g in sodium chloride 0.9 % 100 mL IVPB  2 g Intravenous Q8H Berton Mount, RPH 33.3 mL/hr at 12/23/20 0527 2 g at 12/23/20 0527  . bisacodyl (DULCOLAX) EC tablet 5 mg  5 mg Oral Daily PRN Acheampong, Warnell Bureau, MD   5 mg at  12/18/20 2148  . ceftazidime-avibactam (AVYCAZ) 1.25 g in dextrose 5 % 50 mL IVPB  1.25 g Intravenous Q8H Berton Mount, RPH 16.67 mL/hr at 12/23/20 0528 1.25 g at 12/23/20 0528  . Chlorhexidine Gluconate Cloth 2 % PADS 6 each  6 each Topical Daily Lorella Nimrod, MD   6 each at 12/22/20 0935  . cholecalciferol (VITAMIN D3) tablet 1,000 Units  1,000 Units Oral Daily Acheampong, Warnell Bureau, MD   1,000 Units at 12/22/20 0935  . docusate sodium (COLACE) capsule 100 mg  100 mg Oral BID Artist Beach, MD   100 mg at 12/22/20 2228  . famotidine (PEPCID) tablet 20 mg  20 mg Oral QHS Lorella Nimrod, MD   20 mg at 12/22/20 2227  . heparin injection 5,000 Units  5,000 Units Subcutaneous Q8H Acheampong, Warnell Bureau, MD   5,000 Units at 12/23/20 0557  . insulin aspart (novoLOG) injection 0-6 Units  0-6 Units Subcutaneous TID WC Acheampong, Warnell Bureau, MD   1 Units at 12/20/20 1251  . Netarsudil-Latanoprost 0.02-0.005 % SOLN 1 drop  1 drop Ophthalmic Daily Acheampong, Warnell Bureau, MD   1 drop at 12/22/20 2233  . ondansetron (ZOFRAN) tablet  4 mg  4 mg Oral Q6H PRN Acheampong, Warnell Bureau, MD       Or  . ondansetron (ZOFRAN) injection 4 mg  4 mg Intravenous Q6H PRN Acheampong, Warnell Bureau, MD      . pantoprazole (PROTONIX) EC tablet 40 mg  40 mg Oral QAC breakfast Acheampong, Warnell Bureau, MD   40 mg at 12/22/20 0935  . simvastatin (ZOCOR) tablet 20 mg  20 mg Oral QHS Artist Beach, MD   20 mg at 12/22/20 2227  . tamsulosin (FLOMAX) capsule 0.4 mg  0.4 mg Oral Daily Acheampong, Warnell Bureau, MD   0.4 mg at 12/22/20 0935     Objective: Vital signs in last 24 hours: Temp:  [97.3 F (36.3 C)-98.4 F (36.9 C)] 97.6 F (36.4 C) (05/25 0753) Pulse Rate:  [60-79] 60 (05/25 0753) Resp:  [15-18] 17 (05/25 0753) BP: (85-106)/(52-77) 104/69 (05/25 0753) SpO2:  [93 %-95 %] 94 % (05/25 0753)  Intake/Output from previous day: 05/24 0701 - 05/25 0700 In: 1346.2 [P.O.:600; IV Piggyback:746.2] Out: 1600 [Urine:1600] Intake/Output this shift: No intake/output data recorded.   Physical Exam Constitutional:  Well nourished. Alert and oriented, No acute distress. HEENT: [place] AT, moist mucus membranes.  Trachea midline Cardiovascular: No clubbing, cyanosis, or edema. Respiratory: Normal respiratory effort, no increased work of breathing. Neurologic: Grossly intact, no focal deficits, moving all 4 extremities. Psychiatric: Normal mood and affect.  Lab Results:  Recent Labs    12/22/20 0538  WBC 8.3  HGB 11.6*  HCT 33.1*  PLT 304   BMET Recent Labs    12/22/20 0538  NA 135  K 4.3  CL 103  CO2 23  GLUCOSE 115*  BUN 42*  CREATININE 1.57*  CALCIUM 8.8*   PT/INR No results for input(s): LABPROT, INR in the last 72 hours. ABG No results for input(s): PHART, HCO3 in the last 72 hours.  Invalid input(s): PCO2, PO2  Studies/Results: No results found.   Assessment: 87 year old male who underwent urgent left ureteral stent placement on Dec 16, 2020 for an infected obstructing distal ureteral calculi.  Continues to clinically  improve on empiric antibiotics.  Plan: -left ureteroscopy with laser lithotripsy with ureteral stent exchange on Dec 29, 2020 -Reviewed with the patient and his wife how this procedure is performed -  continue aztreonam + ceftaz/avibactam      LOS: 7 days    [facility] 12/23/2020

## 2020-12-23 NOTE — Progress Notes (Signed)
[facility] PROGRESS NOTE   David Myers QMV:[id number] DOB: 1932/06/30 DOA: 12/16/2020  PCP: Rusty Aus, MD  Brief History/Interval Summary: 87 y.o.malewho is status post TURP in 04/2022by Dr Bernardo Heater. Other comorbidities include diabetes mellitus, kidney stones, history of stroke,obstructive sleep apnea not compliant with CPAP. As per wife, post TURP, patient has been having recurrent UTI/prostatitis and has been on and off antibiotics for several weeks. He was last on levofloxacin for treatment of recurrent UTI but continued to have persistent fever with associated lower abdominal discomfort. Patient was hospitalized after he was seen at urology office.  CT scan was done which showed new left hydronephrosis with obstructive nephrolithiasis.  Patient underwent cystoscopy and stent placement on 5/18.  Urine culture positive for Stenotrophomonas maltophilia with multiple drug resistance.  ID was consulted.   Consultants: Infectious disease.  Urology.  Procedures:    Cystoscopy and ureteral stent placement on 5/18  Antibiotics: Anti-infectives (From admission, onward)   Start     Dose/Rate Route Frequency Ordered Stop   12/22/20 1200  aztreonam (AZACTAM) 2 g in sodium chloride 0.9 % 100 mL IVPB        2 g 33.3 mL/hr over 180 Minutes Intravenous Every 8 hours 12/22/20 0852     12/22/20 1200  ceftazidime-avibactam (AVYCAZ) 1.25 g in dextrose 5 % 50 mL IVPB        1.25 g 16.67 mL/hr over 3 Hours Intravenous Every 8 hours 12/22/20 0852     12/19/20 1230  ceftazidime-avibactam (AVYCAZ) 0.94 g in dextrose 5 % 50 mL IVPB  Status:  Discontinued        0.94 g 16.67 mL/hr over 3 Hours Intravenous Every 12 hours 12/19/20 1132 12/22/20 0852   12/19/20 1230  aztreonam (AZACTAM) 2 g in sodium chloride 0.9 % 100 mL IVPB  Status:  Discontinued        2 g 33.3 mL/hr over 180 Minutes Intravenous Every 12 hours 12/19/20 1132 12/22/20 0852   12/18/20 2245  levofloxacin (LEVAQUIN)  IVPB 500 mg  Status:  Discontinued       "Followed by" Linked Group Details   500 mg 100 mL/hr over 60 Minutes Intravenous Every 48 hours 12/16/20 2147 12/19/20 1123   12/17/20 0000  cefTRIAXone (ROCEPHIN) 2 g in sodium chloride 0.9 % 100 mL IVPB  Status:  Discontinued        2 g 200 mL/hr over 30 Minutes Intravenous Every 24 hours 12/16/20 2110 12/16/20 2112   12/16/20 2245  levofloxacin (LEVAQUIN) IVPB 750 mg       "Followed by" Linked Group Details   750 mg 100 mL/hr over 90 Minutes Intravenous  Once 12/16/20 2147 12/16/20 2359   12/16/20 2115  levofloxacin (LEVAQUIN) IVPB 500 mg  Status:  Discontinued        500 mg 100 mL/hr over 60 Minutes Intravenous Every 24 hours 12/16/20 2112 12/16/20 2147   12/16/20 1745  cefTRIAXone (ROCEPHIN) 1 g in sodium chloride 0.9 % 100 mL IVPB        1 g 200 mL/hr over 30 Minutes Intravenous  Once 12/16/20 1742 12/16/20 1836      Subjective/Interval History: Patient denies any complaints this morning.  Passing yellow urine.  Occasional back pain which is chronic.  His wife is at the bedside.  ROS: Denies any nausea or vomiting.    Assessment/Plan:  Acute left pyelonephritis with multidrug-resistant stenotrophomonas infection Infectious diseases following.  Patient remains on aztreonam and ceftazidime/avibactam.  Plan is for  him to remain on these antibiotics till 6/1.  Nephrolithiasis with obstruction Status post cystoscopy and ureteral stent placement ON 5/18.  Urology has been following.  Plan is for left ureteroscopy with laser lithotripsy and ureteral stent exchange on 5/31.  Acute kidney injury on chronic kidney disease stage IV Baseline creatinine around 1.2.  Presented with creatinine of 2.71.  After urological intervention and IV fluid hydration patient's renal function has improved with creatinine down to 1.57.  Urine output.  Essential hypertension Monitor blood pressures closely.  Currently not on any antihypertensives  here.  Normocytic anemia Hemoglobin stable.  Continue to monitor.   DVT Prophylaxis: Subcutaneous heparin Code Status: Full code Family Communication: Discussed with patient and his wife Disposition Plan: We will be in the hospital at least June 1.  Plan to go home subsequently.  Mobilize.  PT evaluation.  Status is: Inpatient  Remains inpatient appropriate because:IV treatments appropriate due to intensity of illness or inability to take PO   Dispo: The patient is from: Home              Anticipated d/c is to: Home              Patient currently is not medically stable to d/c.   Difficult to place patient No      Medications:  Scheduled: . acidophilus  1 capsule Oral Daily  . aspirin EC  81 mg Oral Daily  . Chlorhexidine Gluconate Cloth  6 each Topical Daily  . cholecalciferol  1,000 Units Oral Daily  . docusate sodium  100 mg Oral BID  . famotidine  20 mg Oral QHS  . heparin  5,000 Units Subcutaneous Q8H  . insulin aspart  0-6 Units Subcutaneous TID WC  . Netarsudil-Latanoprost  1 drop Ophthalmic Daily  . pantoprazole  40 mg Oral QAC breakfast  . simvastatin  20 mg Oral QHS  . tamsulosin  0.4 mg Oral Daily   Continuous: . sodium chloride Stopped (12/22/20 0414)  . aztreonam 2 g (12/23/20 0527)  . ceftazidime avibactam (AVYCAZ) IVPB 1.25 g (12/23/20 0528)   UKG:URKYHC chloride, acetaminophen **OR** acetaminophen, bisacodyl, ondansetron **OR** ondansetron (ZOFRAN) IV   Objective:  Vital Signs  Vitals:   12/22/20 1554 12/22/20 2018 12/23/20 0442 12/23/20 0753  BP: (!) 85/52 99/64 100/64 104/69  Pulse: 79 69 69 60  Resp: 17 18 15 17   Temp: 97.8 F (36.6 C) 98.4 F (36.9 C) (!) 97.3 F (36.3 C) 97.6 F (36.4 C)  TempSrc: Oral  Oral Oral  SpO2: 93% 93% 95% 94%  Weight:      Height:        Intake/Output Summary (Last 24 hours) at 12/23/2020 1006 Last data filed at 12/23/2020 0527 Gross per 24 hour  Intake 986.16 ml  Output 1600 ml  Net -613.84 ml    Filed Weights   12/16/20 2200  Weight: 72.4 kg    General appearance: Awake alert.  In no distress Resp: Clear to auscultation bilaterally.  Normal effort Cardio: S1-S2 is normal regular.  No S3-S4.  No rubs murmurs or bruit GI: Abdomen is soft.  Nontender nondistended.  Bowel sounds are present normal.  No masses organomegaly Extremities: No edema.  Full range of motion of lower extremities. Neurologic: Alert and oriented x3.  No focal neurological deficits.    Lab Results:  Data Reviewed: I have personally reviewed following labs and imaging studies  CBC: Recent Labs  Lab 12/16/20 1533 12/16/20 1639 12/17/20 0528 12/18/20 0439 12/19/20  6063 12/20/20 0539 12/22/20 0538  WBC 11.9*   < > 8.2 9.1 9.3 7.7 8.3  NEUTROABS 10.1*  --   --   --   --   --   --   HGB 11.5*   < > 11.1* 10.7* 11.7* 10.9* 11.6*  HCT 33.6*   < > 32.5* 30.8* 33.7* 32.1* 33.1*  MCV 92.3   < > 92.1 91.4 90.3 90.2 89.7  PLT 284   < > 262 276 288 276 304   < > = values in this interval not displayed.    Basic Metabolic Panel: Recent Labs  Lab 12/16/20 1639 12/17/20 0528 12/18/20 0439 12/20/20 0539 12/22/20 0538  NA 131* 134* 137 137 135  K 4.6 4.6 4.1 4.0 4.3  CL 95* 101 102 102 103  CO2 23 23 25 25 23   GLUCOSE 143* 203* 123* 132* 115*  BUN 32* 31* 33* 37* 42*  CREATININE 2.58* 2.05* 1.80* 1.68* 1.57*  CALCIUM 9.3 8.6* 9.0 8.8* 8.8*    GFR: Estimated Creatinine Clearance: 31 mL/min (A) (by C-G formula based on SCr of 1.57 mg/dL (H)).   CBG: Recent Labs  Lab 12/21/20 1126 12/21/20 1658 12/21/20 2124 12/22/20 0759 12/22/20 1227  GLUCAP 148* 121* 157* 122* 119*     Recent Results (from the past 240 hour(s))  Microscopic Examination     Status: Abnormal   Collection Time: 12/16/20  2:35 PM   Urine  Result Value Ref Range Status   WBC, UA 11-30 (A) 0 - 5 /hpf Final   RBC >30 (H) 0 - 2 /hpf Final   Epithelial Cells (non renal) 0-10 0 - 10 /hpf Final   Casts Present (A) None  seen /lpf Final   Cast Type Hyaline casts N/A Final   Bacteria, UA None seen None seen/Few Final  CULTURE, URINE COMPREHENSIVE     Status: None   Collection Time: 12/16/20  4:29 PM   Specimen: Urine   UR  Result Value Ref Range Status   Urine Culture, Comprehensive Final report  Final   Organism ID, Bacteria Comment  Final    Comment: Mixed urogenital flora 2,000 Colonies/mL   Blood culture (routine x 2)     Status: None   Collection Time: 12/16/20  4:39 PM   Specimen: BLOOD  Result Value Ref Range Status   Specimen Description BLOOD RIGHT ANTECUBITAL  Final   Special Requests   Final    BOTTLES DRAWN AEROBIC AND ANAEROBIC Blood Culture adequate volume   Culture   Final    NO GROWTH 5 DAYS Performed at [facility], [address] [place], [place] [address]    Report Status 12/21/2020 FINAL  Final  Resp Panel by RT-PCR (Flu A&B, Covid) Nasopharyngeal Swab     Status: None   Collection Time: 12/16/20  6:05 PM   Specimen: Nasopharyngeal Swab; Nasopharyngeal(NP) swabs in vial transport medium  Result Value Ref Range Status   SARS Coronavirus 2 by RT PCR NEGATIVE NEGATIVE Final    Comment: (NOTE) SARS-CoV-2 target nucleic acids are NOT DETECTED.  The SARS-CoV-2 RNA is generally detectable in upper respiratory specimens during the acute phase of infection. The lowest concentration of SARS-CoV-2 viral copies this assay can detect is 138 copies/mL. A negative result does not preclude SARS-Cov-2 infection and should not be used as the sole basis for treatment or other patient management decisions. A negative result may occur with  improper specimen collection/handling, submission of specimen other than nasopharyngeal swab,  presence of viral mutation(s) within the areas targeted by this assay, and inadequate number of viral copies(<138 copies/mL). A negative result must be combined with clinical observations, patient history, and epidemiological information. The expected  result is Negative.  Fact Sheet for Patients:  EntrepreneurPulse.com.au  Fact Sheet for Healthcare Providers:  IncredibleEmployment.be  This test is no t yet approved or cleared by the [place] FDA and  has been authorized for detection and/or diagnosis of SARS-CoV-2 by FDA under an Emergency Use Authorization (EUA). This EUA will remain  in effect (meaning this test can be used) for the duration of the COVID-19 declaration under Section 564(b)(1) of the Act, 21 U.S.C.section 360bbb-3(b)(1), unless the authorization is terminated  or revoked sooner.       Influenza A by PCR NEGATIVE NEGATIVE Final   Influenza B by PCR NEGATIVE NEGATIVE Final    Comment: (NOTE) The Xpert Xpress SARS-CoV-2/FLU/RSV plus assay is intended as an aid in the diagnosis of influenza from Nasopharyngeal swab specimens and should not be used as a sole basis for treatment. Nasal washings and aspirates are unacceptable for Xpert Xpress SARS-CoV-2/FLU/RSV testing.  Fact Sheet for Patients: EntrepreneurPulse.com.au  Fact Sheet for Healthcare Providers: IncredibleEmployment.be  This test is not yet approved or cleared by the [place] FDA and has been authorized for detection and/or diagnosis of SARS-CoV-2 by FDA under an Emergency Use Authorization (EUA). This EUA will remain in effect (meaning this test can be used) for the duration of the COVID-19 declaration under Section 564(b)(1) of the Act, 21 U.S.C. section 360bbb-3(b)(1), unless the authorization is terminated or revoked.  Performed at [facility], [address], [place], [place] [address]   Urine Culture     Status: None   Collection Time: 12/16/20  6:05 PM   Specimen: Urine, Random  Result Value Ref Range Status   Specimen Description   Final    URINE, RANDOM Performed at [facility], [address], [place], [place] [address]     Special Requests   Final    NONE Performed at [facility], [address] [place], [place] [address]    Culture   Final    NO GROWTH Performed at [facility], [place] [address] [place], [place] [address]    Report Status 12/18/2020 FINAL  Final  Anaerobic culture w Gram Stain     Status: None   Collection Time: 12/16/20  7:37 PM   Specimen: Urine, Cystoscope  Result Value Ref Range Status   Specimen Description   Final    URINE, CATHETERIZED LEFT RENAL PELVIS Performed at [facility], [place] [address] [place], [place] [address]    Special Requests   Final    NONE Performed at [facility], [address] [place], [place] [address]    Gram Stain   Final    FEW WBC PRESENT, PREDOMINANTLY PMN MODERATE GRAM NEGATIVE RODS    Culture   Final    NO ANAEROBES ISOLATED Performed at [facility], [place] [address], [place], [place] [address]    Report Status 12/22/2020 FINAL  Final  Urine culture     Status: Abnormal (Preliminary result)   Collection Time: 12/16/20  7:56 PM   Specimen: Urine, Catheterized  Result Value Ref Range Status   Specimen Description   Final    URINE, CATHETERIZED LEFT RENAL PELVIS Performed at [facility], [place] [address] [place], [place] [address]    Special  Requests   Final    NONE Performed at [facility], [address] [place], [place] [address]    Culture (A)  Final    >=100,000 COLONIES/mL STENOTROPHOMONAS MALTOPHILIA MULTI-DRUG RESISTANT ORGANISM RESULT CALLED TO, READ BACK BY AND VERIFIED WITH: DR [facility] ON [id number] AT 0830 BY E.PARRISH Sent to [place] for further susceptibility testing. Performed at [facility], [place] [address] [place], [place] [address]    Report Status PENDING  Incomplete   Organism ID, Bacteria STENOTROPHOMONAS MALTOPHILIA (A)  Final      Susceptibility   Stenotrophomonas maltophilia - MIC*    LEVOFLOXACIN >=8 RESISTANT Resistant     TRIMETH/SULFA >=320  RESISTANT Resistant     * >=100,000 COLONIES/mL STENOTROPHOMONAS MALTOPHILIA      Radiology Studies: No results found.     LOS: 7 days   Arsal Tappan [employer] on [email or web address]  12/23/2020, 10:06 AM

## 2020-12-23 NOTE — Evaluation (Signed)
Physical Therapy Evaluation Patient Details Name: David Myers MRN: [id number] DOB: [birth year] Today's Date: 12/23/2020   History of Present Illness  87 y.o. male status post TURP in 10/2020 by Dr Bernardo Heater, has been having issues with recurrent UTI and is now here on antibiotics and waiting for kidney stone and stent replacement procedure.  Other comorbidities include diabetes mellitus, kidney stones, history of stroke,obstructive sleep apnea not compliant with CPAP.  Patient underwent cystoscopy and stent placement on 5/18.  Urine culture positive for Stenotrophomonas maltophilia with multiple drug resistance  Clinical Impression  Pt showed good effort and willingness to work with PT.  He reports that he typically walks 45 minutes per day (no AD) and has no had issues with LOBs or unsteadiness.  He was able to do all mobility and ~200 ft of in-room ambulation w/o direct assist or safety issues.  Pt will be here for ~1 week on antibiotics leading up to his procedure next week.  Do not expect that he will need PT at discharge but we will maintain him on caseload while here to insure activity while in-house.    Follow Up Recommendations No PT follow up    Equipment Recommendations  None recommended by PT    Recommendations for Other Services       Precautions / Restrictions Precautions Precautions: Fall Restrictions Weight Bearing Restrictions: No      Mobility  Bed Mobility Overal bed mobility: Independent             General bed mobility comments: easily gets to EOB    Transfers Overall transfer level: Independent Equipment used: None             General transfer comment: able to rise easily and confidently w/o AD  Ambulation/Gait Ambulation/Gait assistance: Supervision Gait Distance (Feet): 200 Feet Assistive device: None       General Gait Details: multiple loops in the room w/o issue.  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    Modified Rankin (Stroke Patients Only)       Balance Overall balance assessment: Modified Independent                                           Pertinent Vitals/Pain Pain Assessment: No/denies pain    Home Living Family/patient expects to be discharged to:: Private residence Living Arrangements: Spouse/significant other     Home Access: Stairs to enter       Home Equipment: Bedside commode      Prior Function Level of Independence: Independent         Comments: Pt still working part time, walks ~45 minutes QD     Hand Dominance        Extremity/Trunk Assessment   Upper Extremity Assessment Upper Extremity Assessment: Overall WFL for tasks assessed (L grip strength weaker than R, more so than expected non-dominant)    Lower Extremity Assessment Lower Extremity Assessment: Overall WFL for tasks assessed       Communication   Communication: No difficulties  Cognition Arousal/Alertness: Awake/alert Behavior During Therapy: WFL for tasks assessed/performed Overall Cognitive Status: Within Functional Limits for tasks assessed  General Comments      Exercises     Assessment/Plan    PT Assessment Patient needs continued PT services  PT Problem List Decreased strength;Decreased activity tolerance;Decreased balance;Decreased mobility       PT Treatment Interventions DME instruction;Gait training;Stair training;Functional mobility training;Therapeutic activities;Therapeutic exercise;Balance training;Neuromuscular re-education;Patient/family education    PT Goals (Current goals can be found in the Care Plan section)  Acute Rehab PT Goals Patient Stated Goal: go home PT Goal Formulation: With patient Time For Goal Achievement: 01/06/21 Potential to Achieve  Goals: Good    Frequency Min 2X/week   Barriers to discharge        Co-evaluation               AM-PAC PT "6 Clicks" Mobility  Outcome Measure Help needed turning from your back to your side while in a flat bed without using bedrails?: None Help needed moving from lying on your back to sitting on the side of a flat bed without using bedrails?: None Help needed moving to and from a bed to a chair (including a wheelchair)?: None Help needed standing up from a chair using your arms (e.g., wheelchair or bedside chair)?: None Help needed to walk in hospital room?: None Help needed climbing 3-5 steps with a railing? : None 6 Click Score: 24    End of Session   Activity Tolerance: Patient tolerated treatment well Patient left: in chair;with call bell/phone within reach;with nursing/sitter in room Nurse Communication: Mobility status PT Visit Diagnosis: Muscle weakness (generalized) (M62.81)    Time: 2244-9753 PT Time Calculation (min) (ACUTE ONLY): 26 min   Charges:   PT Evaluation $PT Eval Low Complexity: 1 Low PT Treatments $Gait Training: 8-22 mins        Kreg Shropshire, DPT 12/23/2020, 1:34 PM

## 2020-12-24 DIAGNOSIS — N179 Acute kidney failure, unspecified: Secondary | ICD-10-CM | POA: Diagnosis not present

## 2020-12-24 DIAGNOSIS — N12 Tubulo-interstitial nephritis, not specified as acute or chronic: Secondary | ICD-10-CM | POA: Diagnosis not present

## 2020-12-24 LAB — BASIC METABOLIC PANEL
Anion gap: 8 (ref 5–15)
BUN: 47 mg/dL — ABNORMAL HIGH (ref 8–23)
CO2: 24 mmol/L (ref 22–32)
Calcium: 9 mg/dL (ref 8.9–10.3)
Chloride: 106 mmol/L (ref 98–111)
Creatinine, Ser: 1.43 mg/dL — ABNORMAL HIGH (ref 0.61–1.24)
GFR, Estimated: 47 mL/min — ABNORMAL LOW (ref >60)
Glucose, Bld: 118 mg/dL — ABNORMAL HIGH (ref 70–99)
Potassium: 5.1 mmol/L (ref 3.5–5.1)
Sodium: 138 mmol/L (ref 135–145)

## 2020-12-24 LAB — CBC
HCT: 32.9 % — ABNORMAL LOW (ref 39.0–52.0)
Hemoglobin: 11.3 g/dL — ABNORMAL LOW (ref 13.0–17.0)
MCH: 31.9 pg (ref 26.0–34.0)
MCHC: 34.3 g/dL (ref 30.0–36.0)
MCV: 92.9 fL (ref 80.0–100.0)
Platelets: 284 10*3/uL (ref 150–400)
RBC: 3.54 MIL/uL — ABNORMAL LOW (ref 4.22–5.81)
RDW: 13 % (ref 11.5–15.5)
WBC: 8.4 10*3/uL (ref 4.0–10.5)
nRBC: 0 % (ref 0.0–0.2)

## 2020-12-24 LAB — GLUCOSE, CAPILLARY
Glucose-Capillary: 137 mg/dL — ABNORMAL HIGH (ref 70–99)
Glucose-Capillary: 138 mg/dL — ABNORMAL HIGH (ref 70–99)
Glucose-Capillary: 121 mg/dL — ABNORMAL HIGH (ref 70–99)
Glucose-Capillary: 181 mg/dL — ABNORMAL HIGH (ref 70–99)

## 2020-12-24 NOTE — Plan of Care (Signed)
  Problem: Education: Goal: Knowledge of General Education information will improve Description: Including pain rating scale, medication(s)/side effects and non-pharmacologic comfort measures Outcome: Progressing   Problem: Activity: Goal: Risk for activity intolerance will decrease Outcome: Progressing   Problem: Coping: Goal: Level of anxiety will decrease Outcome: Progressing   

## 2020-12-24 NOTE — Progress Notes (Signed)
[facility] PROGRESS NOTE   David Myers TGP:[id number] DOB: 1932-04-09 DOA: 12/16/2020  PCP: Rusty Aus, MD  Brief History/Interval Summary: 87 y.o.malewho is status post TURP in 04/2022by Dr Bernardo Heater. Other comorbidities include diabetes mellitus, kidney stones, history of stroke,obstructive sleep apnea not compliant with CPAP. As per wife, post TURP, patient has been having recurrent UTI/prostatitis and has been on and off antibiotics for several weeks. He was last on levofloxacin for treatment of recurrent UTI but continued to have persistent fever with associated lower abdominal discomfort. Patient was hospitalized after he was seen at urology office.  CT scan was done which showed new left hydronephrosis with obstructive nephrolithiasis.  Patient underwent cystoscopy and stent placement on 5/18.  Urine culture positive for Stenotrophomonas maltophilia with multiple drug resistance.  ID was consulted.   Consultants: Infectious disease.  Urology.  Procedures:   Cystoscopy and ureteral stent placement on 5/18   Antibiotics: Anti-infectives (From admission, onward)   Start     Dose/Rate Route Frequency Ordered Stop   12/22/20 1200  aztreonam (AZACTAM) 2 g in sodium chloride 0.9 % 100 mL IVPB        2 g 33.3 mL/hr over 180 Minutes Intravenous Every 8 hours 12/22/20 0852     12/22/20 1200  ceftazidime-avibactam (AVYCAZ) 1.25 g in dextrose 5 % 50 mL IVPB        1.25 g 16.67 mL/hr over 3 Hours Intravenous Every 8 hours 12/22/20 0852     12/19/20 1230  ceftazidime-avibactam (AVYCAZ) 0.94 g in dextrose 5 % 50 mL IVPB  Status:  Discontinued        0.94 g 16.67 mL/hr over 3 Hours Intravenous Every 12 hours 12/19/20 1132 12/22/20 0852   12/19/20 1230  aztreonam (AZACTAM) 2 g in sodium chloride 0.9 % 100 mL IVPB  Status:  Discontinued        2 g 33.3 mL/hr over 180 Minutes Intravenous Every 12 hours 12/19/20 1132 12/22/20 0852   12/18/20 2245  levofloxacin (LEVAQUIN)  IVPB 500 mg  Status:  Discontinued       "Followed by" Linked Group Details   500 mg 100 mL/hr over 60 Minutes Intravenous Every 48 hours 12/16/20 2147 12/19/20 1123   12/17/20 0000  cefTRIAXone (ROCEPHIN) 2 g in sodium chloride 0.9 % 100 mL IVPB  Status:  Discontinued        2 g 200 mL/hr over 30 Minutes Intravenous Every 24 hours 12/16/20 2110 12/16/20 2112   12/16/20 2245  levofloxacin (LEVAQUIN) IVPB 750 mg       "Followed by" Linked Group Details   750 mg 100 mL/hr over 90 Minutes Intravenous  Once 12/16/20 2147 12/16/20 2359   12/16/20 2115  levofloxacin (LEVAQUIN) IVPB 500 mg  Status:  Discontinued        500 mg 100 mL/hr over 60 Minutes Intravenous Every 24 hours 12/16/20 2112 12/16/20 2147   12/16/20 1745  cefTRIAXone (ROCEPHIN) 1 g in sodium chloride 0.9 % 100 mL IVPB        1 g 200 mL/hr over 30 Minutes Intravenous  Once 12/16/20 1742 12/16/20 1836      Subjective/Interval History: Patient continues to feel well.  Denies any complaints.  Having regular bowel movements.  His wife is at the bedside.  Patient did ambulate in the hallway with physical therapy yesterday and did quite well.     Assessment/Plan:  Acute left pyelonephritis with multidrug-resistant stenotrophomonas infection Infectious diseases has been following.  Patient remains  on aztreonam and ceftazidime/avibactam.  Plan is for him to remain on these antibiotics till 6/1.  Nephrolithiasis with obstruction Status post cystoscopy and ureteral stent placement ON 5/18.  Urology has been following.  Plan is for left ureteroscopy with laser lithotripsy and ureteral stent exchange on 5/31.  Acute kidney injury on chronic kidney disease stage IV Baseline creatinine around 1.2.  Presented with creatinine of 2.71.  After urological intervention and IV fluid hydration patient's renal function improving.  Creatinine down to 1.43 today.  Has good urine output.  Continue to monitor.    Essential hypertension Monitor  blood pressures closely.  Currently not on any antihypertensives here.  Normocytic anemia Hemoglobin stable.  Continue to monitor.   DVT Prophylaxis: Subcutaneous heparin Code Status: Full code Family Communication: Discussed with patient and his wife Disposition Plan: We will be in the hospital at least June 1.  Plan to go home subsequently.  Continue to mobilize.  Status is: Inpatient  Remains inpatient appropriate because:IV treatments appropriate due to intensity of illness or inability to take PO   Dispo: The patient is from: Home              Anticipated d/c is to: Home              Patient currently is not medically stable to d/c.   Difficult to place patient No      Medications:  Scheduled: . acidophilus  1 capsule Oral Daily  . aspirin EC  81 mg Oral Daily  . Chlorhexidine Gluconate Cloth  6 each Topical Daily  . cholecalciferol  1,000 Units Oral Daily  . docusate sodium  100 mg Oral BID  . famotidine  20 mg Oral QHS  . heparin  5,000 Units Subcutaneous Q8H  . insulin aspart  0-6 Units Subcutaneous TID WC  . Netarsudil-Latanoprost  1 drop Ophthalmic Daily  . pantoprazole  40 mg Oral QAC breakfast  . simvastatin  20 mg Oral QHS  . tamsulosin  0.4 mg Oral Daily   Continuous: . sodium chloride Stopped (12/22/20 0414)  . aztreonam 2 g (12/24/20 0626)  . ceftazidime avibactam (AVYCAZ) IVPB 1.25 g (12/24/20 3790)   WIO:XBDZHG chloride, acetaminophen **OR** acetaminophen, bisacodyl, ondansetron **OR** ondansetron (ZOFRAN) IV   Objective:  Vital Signs  Vitals:   12/23/20 1547 12/23/20 2021 12/24/20 0423 12/24/20 0827  BP: 98/71 98/71 111/62 132/78  Pulse: 67 76 70 75  Resp: 17 18 16 16   Temp: 98 F (36.7 C) 97.6 F (36.4 C) 97.8 F (36.6 C) (!) 97.3 F (36.3 C)  TempSrc:  Oral Oral Oral  SpO2: 97% 94% 93% 97%  Weight:      Height:        Intake/Output Summary (Last 24 hours) at 12/24/2020 0942 Last data filed at 12/24/2020 0423 Gross per 24 hour   Intake --  Output 1300 ml  Net -1300 ml   Filed Weights   12/16/20 2200  Weight: 72.4 kg    General appearance: Awake alert.  In no distress Resp: Clear to auscultation bilaterally.  Normal effort Cardio: S1-S2 is normal regular.  No S3-S4.  No rubs murmurs or bruit GI: Abdomen is soft.  Nontender nondistended.  Bowel sounds are present normal.  No masses organomegaly Extremities: No edema.  Full range of motion of lower extremities. Neurologic: Alert and oriented x3.  No focal neurological deficits.     Lab Results:  Data Reviewed: I have personally reviewed following labs and imaging studies  CBC: Recent Labs  Lab 12/18/20 0439 12/19/20 0813 12/20/20 0539 12/22/20 0538 12/24/20 0411  WBC 9.1 9.3 7.7 8.3 8.4  HGB 10.7* 11.7* 10.9* 11.6* 11.3*  HCT 30.8* 33.7* 32.1* 33.1* 32.9*  MCV 91.4 90.3 90.2 89.7 92.9  PLT 276 288 276 304 956    Basic Metabolic Panel: Recent Labs  Lab 12/18/20 0439 12/20/20 0539 12/22/20 0538 12/24/20 0411  NA 137 137 135 138  K 4.1 4.0 4.3 5.1  CL 102 102 103 106  CO2 25 25 23 24   GLUCOSE 123* 132* 115* 118*  BUN 33* 37* 42* 47*  CREATININE 1.80* 1.68* 1.57* 1.43*  CALCIUM 9.0 8.8* 8.8* 9.0    GFR: Estimated Creatinine Clearance: 34 mL/min (A) (by C-G formula based on SCr of 1.43 mg/dL (H)).   CBG: Recent Labs  Lab 12/23/20 0754 12/23/20 1157 12/23/20 1622 12/23/20 2119 12/24/20 0825  GLUCAP 123* 158* 137* 138* 121*     Recent Results (from the past 240 hour(s))  Microscopic Examination     Status: Abnormal   Collection Time: 12/16/20  2:35 PM   Urine  Result Value Ref Range Status   WBC, UA 11-30 (A) 0 - 5 /hpf Final   RBC >30 (H) 0 - 2 /hpf Final   Epithelial Cells (non renal) 0-10 0 - 10 /hpf Final   Casts Present (A) None seen /lpf Final   Cast Type Hyaline casts N/A Final   Bacteria, UA None seen None seen/Few Final  CULTURE, URINE COMPREHENSIVE     Status: None   Collection Time: 12/16/20  4:29 PM    Specimen: Urine   UR  Result Value Ref Range Status   Urine Culture, Comprehensive Final report  Final   Organism ID, Bacteria Comment  Final    Comment: Mixed urogenital flora 2,000 Colonies/mL   Blood culture (routine x 2)     Status: None   Collection Time: 12/16/20  4:39 PM   Specimen: BLOOD  Result Value Ref Range Status   Specimen Description BLOOD RIGHT ANTECUBITAL  Final   Special Requests   Final    BOTTLES DRAWN AEROBIC AND ANAEROBIC Blood Culture adequate volume   Culture   Final    NO GROWTH 5 DAYS Performed at [facility], [address] [place], [place] [address]    Report Status 12/21/2020 FINAL  Final  Resp Panel by RT-PCR (Flu A&B, Covid) Nasopharyngeal Swab     Status: None   Collection Time: 12/16/20  6:05 PM   Specimen: Nasopharyngeal Swab; Nasopharyngeal(NP) swabs in vial transport medium  Result Value Ref Range Status   SARS Coronavirus 2 by RT PCR NEGATIVE NEGATIVE Final    Comment: (NOTE) SARS-CoV-2 target nucleic acids are NOT DETECTED.  The SARS-CoV-2 RNA is generally detectable in upper respiratory specimens during the acute phase of infection. The lowest concentration of SARS-CoV-2 viral copies this assay can detect is 138 copies/mL. A negative result does not preclude SARS-Cov-2 infection and should not be used as the sole basis for treatment or other patient management decisions. A negative result may occur with  improper specimen collection/handling, submission of specimen other than nasopharyngeal swab, presence of viral mutation(s) within the areas targeted by this assay, and inadequate number of viral copies(<138 copies/mL). A negative result must be combined with clinical observations, patient history, and epidemiological information. The expected result is Negative.  Fact Sheet for Patients:  EntrepreneurPulse.com.au  Fact Sheet for Healthcare Providers:  IncredibleEmployment.be  This  test is  no t yet approved or cleared by the [place] and  has been authorized for detection and/or diagnosis of SARS-CoV-2 by FDA under an Emergency Use Authorization (EUA). This EUA will remain  in effect (meaning this test can be used) for the duration of the COVID-19 declaration under Section 564(b)(1) of the Act, 21 U.S.C.section 360bbb-3(b)(1), unless the authorization is terminated  or revoked sooner.       Influenza A by PCR NEGATIVE NEGATIVE Final   Influenza B by PCR NEGATIVE NEGATIVE Final    Comment: (NOTE) The Xpert Xpress SARS-CoV-2/FLU/RSV plus assay is intended as an aid in the diagnosis of influenza from Nasopharyngeal swab specimens and should not be used as a sole basis for treatment. Nasal washings and aspirates are unacceptable for Xpert Xpress SARS-CoV-2/FLU/RSV testing.  Fact Sheet for Patients: EntrepreneurPulse.com.au  Fact Sheet for Healthcare Providers: IncredibleEmployment.be  This test is not yet approved or cleared by the [place] FDA and has been authorized for detection and/or diagnosis of SARS-CoV-2 by FDA under an Emergency Use Authorization (EUA). This EUA will remain in effect (meaning this test can be used) for the duration of the COVID-19 declaration under Section 564(b)(1) of the Act, 21 U.S.C. section 360bbb-3(b)(1), unless the authorization is terminated or revoked.  Performed at [facility], [address] [place], [place] [address]   Urine Culture     Status: None   Collection Time: 12/16/20  6:05 PM   Specimen: Urine, Random  Result Value Ref Range Status   Specimen Description   Final    URINE, RANDOM Performed at [facility], [address] [place], [place] [address]    Special Requests   Final    NONE Performed at [facility], [address] [place], [place] [address]    Culture   Final    NO GROWTH Performed at [facility],  [place] [address] [place], [place] [address]    Report Status 12/18/2020 FINAL  Final  Anaerobic culture w Gram Stain     Status: None   Collection Time: 12/16/20  7:37 PM   Specimen: Urine, Cystoscope  Result Value Ref Range Status   Specimen Description   Final    URINE, CATHETERIZED LEFT RENAL PELVIS Performed at [facility], [place] [address], [place], [place] [address]    Special Requests   Final    NONE Performed at [facility], [address] [place], [place] [address]    Gram Stain   Final    FEW WBC PRESENT, PREDOMINANTLY PMN MODERATE GRAM NEGATIVE RODS    Culture   Final    NO ANAEROBES ISOLATED Performed at [facility], [place] [address], [place], [place] [address]    Report Status 12/22/2020 FINAL  Final  Urine culture     Status: Abnormal (Preliminary result)   Collection Time: 12/16/20  7:56 PM   Specimen: Urine, Catheterized  Result Value Ref Range Status   Specimen Description   Final    URINE, CATHETERIZED LEFT RENAL PELVIS Performed at [facility], [place] [address], [place], [place] [address]    Special Requests   Final    NONE Performed at [facility], [address] [place], [place] [address]    Culture (A)  Final    >=100,000 COLONIES/mL STENOTROPHOMONAS MALTOPHILIA MULTI-DRUG RESISTANT ORGANISM RESULT CALLED TO, READ BACK BY AND VERIFIED WITH: DR [facility] ON [id number] AT 0830 BY E.[email or web address] Sent to [facility]  for further susceptibility testing. Performed at [facility], [place] [address], [place], [place] [address]    Report Status PENDING  Incomplete   Organism ID, Bacteria STENOTROPHOMONAS MALTOPHILIA (A)  Final      Susceptibility   Stenotrophomonas maltophilia - MIC*    LEVOFLOXACIN >=8 RESISTANT Resistant     TRIMETH/SULFA >=320 RESISTANT Resistant     * >=100,000 COLONIES/mL STENOTROPHOMONAS MALTOPHILIA      Radiology Studies: No results found.     LOS: 8 days   Marcha Licklider [employer] on [email or web address]  12/24/2020, 9:42 AM

## 2020-12-24 NOTE — Plan of Care (Signed)

## 2020-12-25 DIAGNOSIS — N12 Tubulo-interstitial nephritis, not specified as acute or chronic: Secondary | ICD-10-CM | POA: Diagnosis not present

## 2020-12-25 DIAGNOSIS — N179 Acute kidney failure, unspecified: Secondary | ICD-10-CM | POA: Diagnosis not present

## 2020-12-25 LAB — GLUCOSE, CAPILLARY
Glucose-Capillary: 138 mg/dL — ABNORMAL HIGH (ref 70–99)
Glucose-Capillary: 166 mg/dL — ABNORMAL HIGH (ref 70–99)
Glucose-Capillary: 130 mg/dL — ABNORMAL HIGH (ref 70–99)
Glucose-Capillary: 184 mg/dL — ABNORMAL HIGH (ref 70–99)
Glucose-Capillary: 191 mg/dL — ABNORMAL HIGH (ref 70–99)

## 2020-12-25 LAB — BASIC METABOLIC PANEL
Anion gap: 7 (ref 5–15)
BUN: 42 mg/dL — ABNORMAL HIGH (ref 8–23)
CO2: 21 mmol/L — ABNORMAL LOW (ref 22–32)
Calcium: 8.6 mg/dL — ABNORMAL LOW (ref 8.9–10.3)
Chloride: 108 mmol/L (ref 98–111)
Creatinine, Ser: 1.37 mg/dL — ABNORMAL HIGH (ref 0.61–1.24)
GFR, Estimated: 50 mL/min — ABNORMAL LOW (ref >60)
Glucose, Bld: 122 mg/dL — ABNORMAL HIGH (ref 70–99)
Potassium: 4.3 mmol/L (ref 3.5–5.1)
Sodium: 136 mmol/L (ref 135–145)

## 2020-12-25 NOTE — Progress Notes (Signed)
Brief Pharmacy Note  Received call from micro lab in correspondence from [facility] to run susceptibilities for aztreonam and colistin. Susceptibilities for minocycline/tetracycline are in process.  Benita Gutter  12/25/20

## 2020-12-25 NOTE — Progress Notes (Signed)
[facility] PROGRESS NOTE   David Myers TWS:[id number] DOB: March 18, 1932 DOA: 12/16/2020  PCP: Rusty Aus, MD  Brief History/Interval Summary: 87 y.o.malewho is status post TURP in 04/2022by Dr Bernardo Heater. Other comorbidities include diabetes mellitus, kidney stones, history of stroke,obstructive sleep apnea not compliant with CPAP. As per wife, post TURP, patient has been having recurrent UTI/prostatitis and has been on and off antibiotics for several weeks. He was last on levofloxacin for treatment of recurrent UTI but continued to have persistent fever with associated lower abdominal discomfort. Patient was hospitalized after he was seen at urology office.  CT scan was done which showed new left hydronephrosis with obstructive nephrolithiasis.  Patient underwent cystoscopy and stent placement on 5/18.  Urine culture positive for Stenotrophomonas maltophilia with multiple drug resistance.  ID was consulted.  Consultants: Infectious disease.  Urology.  Procedures:   Cystoscopy and ureteral stent placement on 5/18   Antibiotics: Anti-infectives (From admission, onward)   Start     Dose/Rate Route Frequency Ordered Stop   12/22/20 1200  aztreonam (AZACTAM) 2 g in sodium chloride 0.9 % 100 mL IVPB        2 g 33.3 mL/hr over 180 Minutes Intravenous Every 8 hours 12/22/20 0852     12/22/20 1200  ceftazidime-avibactam (AVYCAZ) 1.25 g in dextrose 5 % 50 mL IVPB        1.25 g 16.67 mL/hr over 3 Hours Intravenous Every 8 hours 12/22/20 0852     12/19/20 1230  ceftazidime-avibactam (AVYCAZ) 0.94 g in dextrose 5 % 50 mL IVPB  Status:  Discontinued        0.94 g 16.67 mL/hr over 3 Hours Intravenous Every 12 hours 12/19/20 1132 12/22/20 0852   12/19/20 1230  aztreonam (AZACTAM) 2 g in sodium chloride 0.9 % 100 mL IVPB  Status:  Discontinued        2 g 33.3 mL/hr over 180 Minutes Intravenous Every 12 hours 12/19/20 1132 12/22/20 0852   12/18/20 2245  levofloxacin (LEVAQUIN)  IVPB 500 mg  Status:  Discontinued       "Followed by" Linked Group Details   500 mg 100 mL/hr over 60 Minutes Intravenous Every 48 hours 12/16/20 2147 12/19/20 1123   12/17/20 0000  cefTRIAXone (ROCEPHIN) 2 g in sodium chloride 0.9 % 100 mL IVPB  Status:  Discontinued        2 g 200 mL/hr over 30 Minutes Intravenous Every 24 hours 12/16/20 2110 12/16/20 2112   12/16/20 2245  levofloxacin (LEVAQUIN) IVPB 750 mg       "Followed by" Linked Group Details   750 mg 100 mL/hr over 90 Minutes Intravenous  Once 12/16/20 2147 12/16/20 2359   12/16/20 2115  levofloxacin (LEVAQUIN) IVPB 500 mg  Status:  Discontinued        500 mg 100 mL/hr over 60 Minutes Intravenous Every 24 hours 12/16/20 2112 12/16/20 2147   12/16/20 1745  cefTRIAXone (ROCEPHIN) 1 g in sodium chloride 0.9 % 100 mL IVPB        1 g 200 mL/hr over 30 Minutes Intravenous  Once 12/16/20 1742 12/16/20 1836      Subjective/Interval History: Patient denies any complaints.  Wife is at the bedside.     Assessment/Plan:  Acute left pyelonephritis with multidrug-resistant stenotrophomonas infection Infectious diseases has been following.  Patient remains on aztreonam and ceftazidime/avibactam.  Plan is for him to remain on these antibiotics till 6/1.   Will need to stay in the hospital while he  is getting these antibiotics as there are no outpatient treatment options available at this time.  Nephrolithiasis with obstruction Status post cystoscopy and ureteral stent placement ON 5/18.  Urology has been following.   Plan is for left ureteroscopy with laser lithotripsy and ureteral stent exchange on 5/31.  Acute kidney injury on chronic kidney disease stage IV Baseline creatinine around 1.2.  Presented with creatinine of 2.71.   Patient renal function started improving after urological intervention and IV fluids.  Continue to monitor.     Essential hypertension We had to hold patient's antihypertensives due to borderline low blood  pressure.  Normocytic anemia Hemoglobin stable.  Continue to monitor.   DVT Prophylaxis: Subcutaneous heparin Code Status: Full code Family Communication: Discussed with patient and his wife Disposition Plan: We will be in the hospital at least June 1.  Plan to go home subsequently.  Continue to mobilize.  Status is: Inpatient  Remains inpatient appropriate because:IV treatments appropriate due to intensity of illness or inability to take PO   Dispo: The patient is from: Home              Anticipated d/c is to: Home              Patient currently is not medically stable to d/c.   Difficult to place patient No      Medications:  Scheduled: . acidophilus  1 capsule Oral Daily  . aspirin EC  81 mg Oral Daily  . Chlorhexidine Gluconate Cloth  6 each Topical Daily  . cholecalciferol  1,000 Units Oral Daily  . docusate sodium  100 mg Oral BID  . famotidine  20 mg Oral QHS  . heparin  5,000 Units Subcutaneous Q8H  . insulin aspart  0-6 Units Subcutaneous TID WC  . Netarsudil-Latanoprost  1 drop Ophthalmic Daily  . pantoprazole  40 mg Oral QAC breakfast  . simvastatin  20 mg Oral QHS  . tamsulosin  0.4 mg Oral Daily   Continuous: . sodium chloride Stopped (12/22/20 0414)  . aztreonam 2 g (12/25/20 0506)  . ceftazidime avibactam (AVYCAZ) IVPB 1.25 g (12/25/20 0507)   HDQ:QIWLNL chloride, acetaminophen **OR** acetaminophen, bisacodyl, ondansetron **OR** ondansetron (ZOFRAN) IV   Objective:  Vital Signs  Vitals:   12/24/20 1548 12/24/20 2044 12/25/20 0520 12/25/20 0824  BP: 103/64 118/72 98/70 108/70  Pulse: 75 69 73 62  Resp: 16 18 18 18   Temp: 98.3 F (36.8 C) 98.3 F (36.8 C) 97.8 F (36.6 C) 98.6 F (37 C)  TempSrc: Oral     SpO2: 94% 93% 94% 93%  Weight:      Height:        Intake/Output Summary (Last 24 hours) at 12/25/2020 1111 Last data filed at 12/25/2020 0506 Gross per 24 hour  Intake --  Output 950 ml  Net -950 ml   Filed Weights   12/16/20  2200  Weight: 72.4 kg    General appearance: Awake alert.  In no distress Resp: Clear to auscultation bilaterally.  Normal effort Cardio: S1-S2 is normal regular.  No S3-S4.  No rubs murmurs or bruit GI: Abdomen is soft.  Nontender nondistended.  Bowel sounds are present normal.  No masses organomegaly    Lab Results:  Data Reviewed: I have personally reviewed following labs and imaging studies  CBC: Recent Labs  Lab 12/19/20 0813 12/20/20 0539 12/22/20 0538 12/24/20 0411  WBC 9.3 7.7 8.3 8.4  HGB 11.7* 10.9* 11.6* 11.3*  HCT 33.7* 32.1* 33.1* 32.9*  MCV 90.3 90.2 89.7 92.9  PLT 288 276 304 720    Basic Metabolic Panel: Recent Labs  Lab 12/20/20 0539 12/22/20 0538 12/24/20 0411 12/25/20 0453  NA 137 135 138 136  K 4.0 4.3 5.1 4.3  CL 102 103 106 108  CO2 25 23 24  21*  GLUCOSE 132* 115* 118* 122*  BUN 37* 42* 47* 42*  CREATININE 1.68* 1.57* 1.43* 1.37*  CALCIUM 8.8* 8.8* 9.0 8.6*    GFR: Estimated Creatinine Clearance: 35.5 mL/min (A) (by C-G formula based on SCr of 1.37 mg/dL (H)).   CBG: Recent Labs  Lab 12/24/20 0825 12/24/20 1214 12/24/20 1659 12/24/20 2037 12/25/20 0739  GLUCAP 121* 181* 138* 166* 130*     Recent Results (from the past 240 hour(s))  Microscopic Examination     Status: Abnormal   Collection Time: 12/16/20  2:35 PM   Urine  Result Value Ref Range Status   WBC, UA 11-30 (A) 0 - 5 /hpf Final   RBC >30 (H) 0 - 2 /hpf Final   Epithelial Cells (non renal) 0-10 0 - 10 /hpf Final   Casts Present (A) None seen /lpf Final   Cast Type Hyaline casts N/A Final   Bacteria, UA None seen None seen/Few Final  CULTURE, URINE COMPREHENSIVE     Status: None   Collection Time: 12/16/20  4:29 PM   Specimen: Urine   UR  Result Value Ref Range Status   Urine Culture, Comprehensive Final report  Final   Organism ID, Bacteria Comment  Final    Comment: Mixed urogenital flora 2,000 Colonies/mL   Blood culture (routine x 2)     Status: None    Collection Time: 12/16/20  4:39 PM   Specimen: BLOOD  Result Value Ref Range Status   Specimen Description BLOOD RIGHT ANTECUBITAL  Final   Special Requests   Final    BOTTLES DRAWN AEROBIC AND ANAEROBIC Blood Culture adequate volume   Culture   Final    NO GROWTH 5 DAYS Performed at [facility], [address] [place], [place] [address]    Report Status 12/21/2020 FINAL  Final  Resp Panel by RT-PCR (Flu A&B, Covid) Nasopharyngeal Swab     Status: None   Collection Time: 12/16/20  6:05 PM   Specimen: Nasopharyngeal Swab; Nasopharyngeal(NP) swabs in vial transport medium  Result Value Ref Range Status   SARS Coronavirus 2 by RT PCR NEGATIVE NEGATIVE Final    Comment: (NOTE) SARS-CoV-2 target nucleic acids are NOT DETECTED.  The SARS-CoV-2 RNA is generally detectable in upper respiratory specimens during the acute phase of infection. The lowest concentration of SARS-CoV-2 viral copies this assay can detect is 138 copies/mL. A negative result does not preclude SARS-Cov-2 infection and should not be used as the sole basis for treatment or other patient management decisions. A negative result may occur with  improper specimen collection/handling, submission of specimen other than nasopharyngeal swab, presence of viral mutation(s) within the areas targeted by this assay, and inadequate number of viral copies(<138 copies/mL). A negative result must be combined with clinical observations, patient history, and epidemiological information. The expected result is Negative.  Fact Sheet for Patients:  EntrepreneurPulse.com.au  Fact Sheet for Healthcare Providers:  IncredibleEmployment.be  This test is no t yet approved or cleared by the [place] FDA and  has been authorized for detection and/or diagnosis of SARS-CoV-2 by FDA under an Emergency Use Authorization (EUA). This EUA will remain  in effect (meaning this test  can be used) for  the duration of the COVID-19 declaration under Section 564(b)(1) of the Act, 21 U.S.C.section 360bbb-3(b)(1), unless the authorization is terminated  or revoked sooner.       Influenza A by PCR NEGATIVE NEGATIVE Final   Influenza B by PCR NEGATIVE NEGATIVE Final    Comment: (NOTE) The Xpert Xpress SARS-CoV-2/FLU/RSV plus assay is intended as an aid in the diagnosis of influenza from Nasopharyngeal swab specimens and should not be used as a sole basis for treatment. Nasal washings and aspirates are unacceptable for Xpert Xpress SARS-CoV-2/FLU/RSV testing.  Fact Sheet for Patients: EntrepreneurPulse.com.au  Fact Sheet for Healthcare Providers: IncredibleEmployment.be  This test is not yet approved or cleared by the [place] FDA and has been authorized for detection and/or diagnosis of SARS-CoV-2 by FDA under an Emergency Use Authorization (EUA). This EUA will remain in effect (meaning this test can be used) for the duration of the COVID-19 declaration under Section 564(b)(1) of the Act, 21 U.S.C. section 360bbb-3(b)(1), unless the authorization is terminated or revoked.  Performed at [facility], [address], [place], [place] [address]   Urine Culture     Status: None   Collection Time: 12/16/20  6:05 PM   Specimen: Urine, Random  Result Value Ref Range Status   Specimen Description   Final    URINE, RANDOM Performed at [facility], [address], [place], [place] [address]    Special Requests   Final    NONE Performed at [facility], [address] [place], [place] [address]    Culture   Final    NO GROWTH Performed at [facility], [place] [address], [place], [place] [address]    Report Status 12/18/2020 FINAL  Final  Anaerobic culture w Gram Stain     Status: None   Collection Time: 12/16/20  7:37 PM   Specimen: Urine, Cystoscope  Result Value Ref Range Status   Specimen Description    Final    URINE, CATHETERIZED LEFT RENAL PELVIS Performed at [facility], [place] [address], [place], [place] [address]    Special Requests   Final    NONE Performed at [facility], [address] [place], [place] [address]    Gram Stain   Final    FEW WBC PRESENT, PREDOMINANTLY PMN MODERATE GRAM NEGATIVE RODS    Culture   Final    NO ANAEROBES ISOLATED Performed at [facility], [place] [address] [place], [place] [address]    Report Status 12/22/2020 FINAL  Final  Urine culture     Status: Abnormal (Preliminary result)   Collection Time: 12/16/20  7:56 PM   Specimen: Urine, Catheterized  Result Value Ref Range Status   Specimen Description   Final    URINE, CATHETERIZED LEFT RENAL PELVIS Performed at [facility], [place] [address], [place], [place] [address]    Special Requests   Final    NONE Performed at [facility], [address] [place],  [address]    Culture (A)  Final    >=100,000 COLONIES/mL STENOTROPHOMONAS MALTOPHILIA MULTI-DRUG RESISTANT ORGANISM RESULT CALLED TO, READ BACK BY AND VERIFIED WITH: DR [facility] ON [id number] AT 0830 BY E.PARRISH Sent to [place] for further susceptibility testing. Performed at [facility], [place] [address], [place],  [address]    Report Status PENDING  Incomplete   Organism ID, Bacteria STENOTROPHOMONAS MALTOPHILIA (A)  Final  Susceptibility   Stenotrophomonas maltophilia - MIC*    LEVOFLOXACIN >=8 RESISTANT Resistant     TRIMETH/SULFA >=320 RESISTANT Resistant     * >=100,000 COLONIES/mL STENOTROPHOMONAS MALTOPHILIA      Radiology Studies: No results found.     LOS: 9 days   Matika Bartell [employer] on [email or web address]  12/25/2020, 11:11 AM

## 2020-12-25 NOTE — Plan of Care (Signed)
Education done

## 2020-12-25 NOTE — Progress Notes (Signed)
Physical Therapy Treatment Patient Details Name: David Myers MRN: [id number] DOB: 09-Aug-1931 Today's Date: 12/25/2020    History of Present Illness 87 y.o. male status post TURP in 10/2020 by Dr Bernardo Heater, has been having issues with recurrent UTI and is now here on antibiotics and waiting for kidney stone and stent replacement procedure.  Other comorbidities include diabetes mellitus, kidney stones, history of stroke,obstructive sleep apnea not compliant with CPAP.  Patient underwent cystoscopy and stent placement on 5/18.  Urine culture positive for Stenotrophomonas maltophilia with multiple drug resistance    PT Comments    Pt sitting upright in recliner upon arrival and agreeable to therapy.  Pt's wife present in room throughout session.  Pt noted he had already been up to ambulate around the nursing station multiple times this AM.  Pt continues to report he is at baseline and will continue to perform the exercises given to him as part of HEP today.  Patient is at baseline, all education completed, and time is given to address all questions/concerns. No additional skilled PT services needed at this time, PT signing off. PT recommends daily ambulation ad lib or with nursing staff as needed to prevent deconditioning.     Follow Up Recommendations  No PT follow up     Equipment Recommendations  None recommended by PT    Recommendations for Other Services       Precautions / Restrictions Precautions Precautions: Fall Restrictions Weight Bearing Restrictions: No    Mobility  Bed Mobility Overal bed mobility: Independent                  Transfers Overall transfer level: Independent Equipment used: None             General transfer comment: able to rise easily and confidently w/o AD  Ambulation/Gait             General Gait Details: deferred due to already ambulating this AM.  Pt notes he is at his baseline already.   Stairs              Wheelchair Mobility    Modified Rankin (Stroke Patients Only)       Balance Overall balance assessment: Modified Independent                                          Cognition Arousal/Alertness: Awake/alert Behavior During Therapy: WFL for tasks assessed/performed Overall Cognitive Status: Within Functional Limits for tasks assessed                                        Exercises Total Joint Exercises Ankle Circles/Pumps: AROM;Strengthening;Both;10 reps;Seated Quad Sets: AROM;Strengthening;Both;10 reps;Seated Gluteal Sets: AROM;Strengthening;Both;10 reps;Seated Hip ABduction/ADduction: AROM;Strengthening;Both;10 reps;Seated Long Arc Quad: AROM;Strengthening;Both;10 reps;Seated    General Comments        Pertinent Vitals/Pain Pain Assessment: No/denies pain    Home Living                      Prior Function            PT Goals (current goals can now be found in the care plan section) Acute Rehab PT Goals Patient Stated Goal: go home PT Goal Formulation: With patient Time For Goal Achievement: 01/06/21 Potential to Achieve Goals: Good  Progress towards PT goals: Progressing toward goals    Frequency    Other (Comment)      PT Plan Frequency needs to be updated    Co-evaluation              AM-PAC PT "6 Clicks" Mobility   Outcome Measure  Help needed turning from your back to your side while in a flat bed without using bedrails?: None Help needed moving from lying on your back to sitting on the side of a flat bed without using bedrails?: None Help needed moving to and from a bed to a chair (including a wheelchair)?: None Help needed standing up from a chair using your arms (e.g., wheelchair or bedside chair)?: None Help needed to walk in hospital room?: None Help needed climbing 3-5 steps with a railing? : None 6 Click Score: 24    End of Session   Activity Tolerance: Patient tolerated  treatment well Patient left: in chair;with call bell/phone within reach;with nursing/sitter in room;with family/visitor present Nurse Communication: Mobility status PT Visit Diagnosis: Muscle weakness (generalized) (M62.81)     Time: 7579-7282 PT Time Calculation (min) (ACUTE ONLY): 19 min  Charges:  $Therapeutic Exercise: 8-22 mins                     Gwenlyn Saran, PT, DPT 12/25/20, 2:34 PM

## 2020-12-25 NOTE — Consult Note (Signed)
Pharmacy Antibiotic Note  David Myers is a 87 y.o. male with medical history including TURP 10/2020, diabetes, kidney stones, history of CVA, OSA, recurrent UTI / prostatitis since TURP admitted on 12/16/2020 with obstructive nephrolithasis / new left hydronephrosis. Urology performed urgent cystoscopy and stent placement on 5/18. Patient was empirically placed on LVQ for MDR Stenotrophomonas identified on 11/26/20 UCx which was only LVQ susceptible. Cultures from this admission with MDR Stenotrophomonas which is LVQ R and Bactrim R. ID has been consulted. Pharmacy has been consulted for Avycaz and aztreonam dosing.  per micro lab at [facility] - only susceptibilities for Bactrim and LVQ were reported by [facility]. Per ID recommendations, have requested susceptibilities for minocycline/tetracycline, aztreonam, and colistin to be sent to [facility].  This will take at least 7-10 days.    Today, 12/25/2020  Day #7 antiibotics  Renal function improving  WBC WNL   afebrile  Plan: Per improved renal function, adjust doses as below:   Avycaz 1.25g IV q8h (3-hr infusion) -- renally adjusted  Aztreonam 2 g IV q8h (3-hr infusion) -- renally adjusted  Antibiotics timed to be given simultaneously   Await further susceptibility testing to guide therapy  As per ID,  Will likely need to complete therapy as inpatient  Height: 5\' 7"  (170.2 cm) Weight: 72.4 kg (159 lb 9.8 oz) IBW/kg (Calculated) : 66.1  Temp (24hrs), Avg:98.3 F (36.8 C), Min:97.8 F (36.6 C), Max:98.6 F (37 C)  Recent Labs  Lab 12/19/20 0813 12/20/20 0539 12/22/20 0538 12/24/20 0411 12/25/20 0453  WBC 9.3 7.7 8.3 8.4  --   CREATININE  --  1.68* 1.57* 1.43* 1.37*    Estimated Creatinine Clearance: 35.5 mL/min (A) (by C-G formula based on SCr of 1.37 mg/dL (H)).    Allergies  Allergen Reactions  . Augmentin [Amoxicillin-Pot Clavulanate] Diarrhea    Led to C-diff     Antimicrobials this admission: Levofloxacin 5/18 >>  5/20 Avycaz 5/21 >>  Aztreonam 5/21 >>   Dose adjustments this admission: N/A  Microbiology results: 5/18 BCx: NGTD 5/18 UCx: NG 5/18 UCx (L renal pelvis): MDR Stenotrophomonas maltophilia 5/18 UCx (L renal pelvis): GNR, pending   Thank you for allowing pharmacy to be a part of this patient's care.  Pearla Dubonnet, PharmD Clinical Pharmacist 12/25/2020 1:53 PM

## 2020-12-26 DIAGNOSIS — N179 Acute kidney failure, unspecified: Secondary | ICD-10-CM | POA: Diagnosis not present

## 2020-12-26 DIAGNOSIS — N12 Tubulo-interstitial nephritis, not specified as acute or chronic: Secondary | ICD-10-CM | POA: Diagnosis not present

## 2020-12-26 LAB — CREATININE, SERUM
Creatinine, Ser: 1.31 mg/dL — ABNORMAL HIGH (ref 0.61–1.24)
GFR, Estimated: 53 mL/min — ABNORMAL LOW (ref >60)

## 2020-12-26 NOTE — Progress Notes (Signed)
[facility] PROGRESS NOTE   David Myers RKY:[id number] DOB: August 01, 1932 DOA: 12/16/2020  PCP: Rusty Aus, MD  Brief History/Interval Summary: 87 y.o.malewho is status post TURP in 04/2022by Dr Bernardo Heater. Other comorbidities include diabetes mellitus, kidney stones, history of stroke,obstructive sleep apnea not compliant with CPAP. As per wife, post TURP, patient has been having recurrent UTI/prostatitis and has been on and off antibiotics for several weeks. He was last on levofloxacin for treatment of recurrent UTI but continued to have persistent fever with associated lower abdominal discomfort. Patient was hospitalized after he was seen at urology office.  CT scan was done which showed new left hydronephrosis with obstructive nephrolithiasis.  Patient underwent cystoscopy and stent placement on 5/18.  Urine culture positive for Stenotrophomonas maltophilia with multiple drug resistance.  ID was consulted.  Consultants: Infectious disease.  Urology.  Procedures:   Cystoscopy and ureteral stent placement on 5/18   Antibiotics: Anti-infectives (From admission, onward)   Start     Dose/Rate Route Frequency Ordered Stop   12/22/20 1200  aztreonam (AZACTAM) 2 g in sodium chloride 0.9 % 100 mL IVPB        2 g 33.3 mL/hr over 180 Minutes Intravenous Every 8 hours 12/22/20 0852     12/22/20 1200  ceftazidime-avibactam (AVYCAZ) 1.25 g in dextrose 5 % 50 mL IVPB        1.25 g 16.67 mL/hr over 3 Hours Intravenous Every 8 hours 12/22/20 0852     12/19/20 1230  ceftazidime-avibactam (AVYCAZ) 0.94 g in dextrose 5 % 50 mL IVPB  Status:  Discontinued        0.94 g 16.67 mL/hr over 3 Hours Intravenous Every 12 hours 12/19/20 1132 12/22/20 0852   12/19/20 1230  aztreonam (AZACTAM) 2 g in sodium chloride 0.9 % 100 mL IVPB  Status:  Discontinued        2 g 33.3 mL/hr over 180 Minutes Intravenous Every 12 hours 12/19/20 1132 12/22/20 0852   12/18/20 2245  levofloxacin (LEVAQUIN)  IVPB 500 mg  Status:  Discontinued       "Followed by" Linked Group Details   500 mg 100 mL/hr over 60 Minutes Intravenous Every 48 hours 12/16/20 2147 12/19/20 1123   12/17/20 0000  cefTRIAXone (ROCEPHIN) 2 g in sodium chloride 0.9 % 100 mL IVPB  Status:  Discontinued        2 g 200 mL/hr over 30 Minutes Intravenous Every 24 hours 12/16/20 2110 12/16/20 2112   12/16/20 2245  levofloxacin (LEVAQUIN) IVPB 750 mg       "Followed by" Linked Group Details   750 mg 100 mL/hr over 90 Minutes Intravenous  Once 12/16/20 2147 12/16/20 2359   12/16/20 2115  levofloxacin (LEVAQUIN) IVPB 500 mg  Status:  Discontinued        500 mg 100 mL/hr over 60 Minutes Intravenous Every 24 hours 12/16/20 2112 12/16/20 2147   12/16/20 1745  cefTRIAXone (ROCEPHIN) 1 g in sodium chloride 0.9 % 100 mL IVPB        1 g 200 mL/hr over 30 Minutes Intravenous  Once 12/16/20 1742 12/16/20 1836      Subjective/Interval History: Patient without any complaints.  Wife is at the bedside.  Has been ambulating every day.   Assessment/Plan:  Acute left pyelonephritis with multidrug-resistant stenotrophomonas infection Infectious diseases has been following.  Patient remains on aztreonam and ceftazidime/avibactam.  Plan is for him to remain on these antibiotics till 6/1.   Will need to stay in  the hospital while he is getting these antibiotics as there are no outpatient treatment options available at this time. We will involve ID again on 5/31 to finalize antibiotic plan and discharge.  Nephrolithiasis with obstruction Status post cystoscopy and ureteral stent placement ON 5/18.  Urology has been following.   Plan is for left ureteroscopy with laser lithotripsy and ureteral stent exchange on 5/31.  Acute kidney injury on chronic kidney disease stage IV Baseline creatinine around 1.2.  Presented with creatinine of 2.71.   Patient renal function started improving after urological intervention and IV fluids.  Continue to  monitor.     Essential hypertension We had to hold patient's antihypertensives due to borderline low blood pressure.  Blood pressure is low but stable.  He is asymptomatic.  Normocytic anemia Hemoglobin stable.  Continue to monitor.   DVT Prophylaxis: Subcutaneous heparin Code Status: Full code Family Communication: Discussed with patient and his wife Disposition Plan: He will be in the hospital at least June 1.  Plan to go home subsequently.  Continue to mobilize.  Status is: Inpatient  Remains inpatient appropriate because:IV treatments appropriate due to intensity of illness or inability to take PO   Dispo: The patient is from: Home              Anticipated d/c is to: Home              Patient currently is not medically stable to d/c.   Difficult to place patient No      Medications:  Scheduled: . acidophilus  1 capsule Oral Daily  . aspirin EC  81 mg Oral Daily  . Chlorhexidine Gluconate Cloth  6 each Topical Daily  . cholecalciferol  1,000 Units Oral Daily  . docusate sodium  100 mg Oral BID  . famotidine  20 mg Oral QHS  . heparin  5,000 Units Subcutaneous Q8H  . insulin aspart  0-6 Units Subcutaneous TID WC  . Netarsudil-Latanoprost  1 drop Ophthalmic Daily  . pantoprazole  40 mg Oral QAC breakfast  . simvastatin  20 mg Oral QHS  . tamsulosin  0.4 mg Oral Daily   Continuous: . sodium chloride Stopped (12/22/20 0414)  . aztreonam 2 g (12/26/20 1856)  . ceftazidime avibactam (AVYCAZ) IVPB 1.25 g (12/26/20 3149)   FWY:OVZCHY chloride, acetaminophen **OR** acetaminophen, bisacodyl, ondansetron **OR** ondansetron (ZOFRAN) IV   Objective:  Vital Signs  Vitals:   12/25/20 2046 12/25/20 2348 12/26/20 0517 12/26/20 0831  BP: 109/72 103/69 (!) 105/59 (!) 106/59  Pulse: 74 74 63 64  Resp: 17 12 16 18   Temp: 98.2 F (36.8 C) 98 F (36.7 C) 98.1 F (36.7 C) 98.6 F (37 C)  TempSrc: Oral Oral Oral   SpO2: 92% 94% 94% 90%  Weight:      Height:         Intake/Output Summary (Last 24 hours) at 12/26/2020 0908 Last data filed at 12/26/2020 0618 Gross per 24 hour  Intake --  Output 800 ml  Net -800 ml   Filed Weights   12/16/20 2200  Weight: 72.4 kg    General appearance: Awake alert.  In no distress Resp: Clear to auscultation bilaterally.  Normal effort Cardio: S1-S2 is normal regular.  No S3-S4.  No rubs murmurs or bruit GI: Abdomen is soft.  Nontender nondistended.  Bowel sounds are present normal.  No masses organomegaly   Lab Results:  Data Reviewed: I have personally reviewed following labs and imaging studies  CBC: Recent  Labs  Lab 12/20/20 0539 12/22/20 0538 12/24/20 0411  WBC 7.7 8.3 8.4  HGB 10.9* 11.6* 11.3*  HCT 32.1* 33.1* 32.9*  MCV 90.2 89.7 92.9  PLT 276 304 767    Basic Metabolic Panel: Recent Labs  Lab 12/20/20 0539 12/22/20 0538 12/24/20 0411 12/25/20 0453 12/26/20 0819  NA 137 135 138 136  --   K 4.0 4.3 5.1 4.3  --   CL 102 103 106 108  --   CO2 25 23 24  21*  --   GLUCOSE 132* 115* 118* 122*  --   BUN 37* 42* 47* 42*  --   CREATININE 1.68* 1.57* 1.43* 1.37* 1.31*  CALCIUM 8.8* 8.8* 9.0 8.6*  --     GFR: Estimated Creatinine Clearance: 37.1 mL/min (A) (by C-G formula based on SCr of 1.31 mg/dL (H)).   CBG: Recent Labs  Lab 12/24/20 1659 12/24/20 2037 12/25/20 0739 12/25/20 1146 12/25/20 1644  GLUCAP 138* 166* 130* 184* 191*     Recent Results (from the past 240 hour(s))  Microscopic Examination     Status: Abnormal   Collection Time: 12/16/20  2:35 PM   Urine  Result Value Ref Range Status   WBC, UA 11-30 (A) 0 - 5 /hpf Final   RBC >30 (H) 0 - 2 /hpf Final   Epithelial Cells (non renal) 0-10 0 - 10 /hpf Final   Casts Present (A) None seen /lpf Final   Cast Type Hyaline casts N/A Final   Bacteria, UA None seen None seen/Few Final  CULTURE, URINE COMPREHENSIVE     Status: None   Collection Time: 12/16/20  4:29 PM   Specimen: Urine   UR  Result Value Ref Range  Status   Urine Culture, Comprehensive Final report  Final   Organism ID, Bacteria Comment  Final    Comment: Mixed urogenital flora 2,000 Colonies/mL   Blood culture (routine x 2)     Status: None   Collection Time: 12/16/20  4:39 PM   Specimen: BLOOD  Result Value Ref Range Status   Specimen Description BLOOD RIGHT ANTECUBITAL  Final   Special Requests   Final    BOTTLES DRAWN AEROBIC AND ANAEROBIC Blood Culture adequate volume   Culture   Final    NO GROWTH 5 DAYS Performed at [facility], [address] [place], [place] [address]    Report Status 12/21/2020 FINAL  Final  Resp Panel by RT-PCR (Flu A&B, Covid) Nasopharyngeal Swab     Status: None   Collection Time: 12/16/20  6:05 PM   Specimen: Nasopharyngeal Swab; Nasopharyngeal(NP) swabs in vial transport medium  Result Value Ref Range Status   SARS Coronavirus 2 by RT PCR NEGATIVE NEGATIVE Final    Comment: (NOTE) SARS-CoV-2 target nucleic acids are NOT DETECTED.  The SARS-CoV-2 RNA is generally detectable in upper respiratory specimens during the acute phase of infection. The lowest concentration of SARS-CoV-2 viral copies this assay can detect is 138 copies/mL. A negative result does not preclude SARS-Cov-2 infection and should not be used as the sole basis for treatment or other patient management decisions. A negative result may occur with  improper specimen collection/handling, submission of specimen other than nasopharyngeal swab, presence of viral mutation(s) within the areas targeted by this assay, and inadequate number of viral copies(<138 copies/mL). A negative result must be combined with clinical observations, patient history, and epidemiological information. The expected result is Negative.  Fact Sheet for Patients:  EntrepreneurPulse.com.au  Fact Sheet for Healthcare  Providers:  IncredibleEmployment.be  This test is no t yet approved or cleared by the [place] and  has been authorized for detection and/or diagnosis of SARS-CoV-2 by FDA under an Emergency Use Authorization (EUA). This EUA will remain  in effect (meaning this test can be used) for the duration of the COVID-19 declaration under Section 564(b)(1) of the Act, 21 U.S.C.section 360bbb-3(b)(1), unless the authorization is terminated  or revoked sooner.       Influenza A by PCR NEGATIVE NEGATIVE Final   Influenza B by PCR NEGATIVE NEGATIVE Final    Comment: (NOTE) The Xpert Xpress SARS-CoV-2/FLU/RSV plus assay is intended as an aid in the diagnosis of influenza from Nasopharyngeal swab specimens and should not be used as a sole basis for treatment. Nasal washings and aspirates are unacceptable for Xpert Xpress SARS-CoV-2/FLU/RSV testing.  Fact Sheet for Patients: EntrepreneurPulse.com.au  Fact Sheet for Healthcare Providers: IncredibleEmployment.be  This test is not yet approved or cleared by the [place] FDA and has been authorized for detection and/or diagnosis of SARS-CoV-2 by FDA under an Emergency Use Authorization (EUA). This EUA will remain in effect (meaning this test can be used) for the duration of the COVID-19 declaration under Section 564(b)(1) of the Act, 21 U.S.C. section 360bbb-3(b)(1), unless the authorization is terminated or revoked.  Performed at [facility], [address], [place], [place] [address]   Urine Culture     Status: None   Collection Time: 12/16/20  6:05 PM   Specimen: Urine, Random  Result Value Ref Range Status   Specimen Description   Final    URINE, RANDOM Performed at [facility], [address], [place], [place] [address]    Special Requests   Final    NONE Performed at [facility], [address] [place], [place] [address]    Culture   Final    NO GROWTH Performed at [facility], [place] [address], [place], [place] [address]    Report  Status 12/18/2020 FINAL  Final  Anaerobic culture w Gram Stain     Status: None   Collection Time: 12/16/20  7:37 PM   Specimen: Urine, Cystoscope  Result Value Ref Range Status   Specimen Description   Final    URINE, CATHETERIZED LEFT RENAL PELVIS Performed at [facility], [place] [address], [place], [place] [address]    Special Requests   Final    NONE Performed at [facility], [address] [place], [place] [address]    Gram Stain   Final    FEW WBC PRESENT, PREDOMINANTLY PMN MODERATE GRAM NEGATIVE RODS    Culture   Final    NO ANAEROBES ISOLATED Performed at [facility], [place] [address], [place], [place] [address]    Report Status 12/22/2020 FINAL  Final  Urine culture     Status: Abnormal (Preliminary result)   Collection Time: 12/16/20  7:56 PM   Specimen: Urine, Catheterized  Result Value Ref Range Status   Specimen Description   Final    URINE, CATHETERIZED LEFT RENAL PELVIS Performed at [facility], [place] [address] [place], [place] [address]    Special Requests   Final    NONE Performed at [facility], [address] [place], [place] [address]    Culture (A)  Final    >=100,000 COLONIES/mL STENOTROPHOMONAS MALTOPHILIA MULTI-DRUG RESISTANT ORGANISM RESULT CALLED TO, READ BACK BY AND VERIFIED WITH: DR S.AMIN ON  [id number] AT 0830 BY E.PARRISH Sent to Gate for further susceptibility testing. Performed at [facility], [place] [address] [place], [place] [address]    Report Status PENDING  Incomplete   Organism ID, Bacteria STENOTROPHOMONAS MALTOPHILIA (A)  Final      Susceptibility   Stenotrophomonas maltophilia - MIC*    LEVOFLOXACIN >=8 RESISTANT Resistant     TRIMETH/SULFA >=320 RESISTANT Resistant     * >=100,000 COLONIES/mL STENOTROPHOMONAS MALTOPHILIA      Radiology Studies: No results found.     LOS: 10 days   Darryl Willner [employer] on [email or web address]  12/26/2020, 9:08 AM

## 2020-12-26 NOTE — Consult Note (Signed)
Pharmacy Antibiotic Note  David Myers is a 87 y.o. male with medical history including TURP 10/2020, diabetes, kidney stones, history of CVA, OSA, recurrent UTI / prostatitis since TURP admitted on 12/16/2020 with obstructive nephrolithasis / new left hydronephrosis. Urology performed urgent cystoscopy and stent placement on 5/18. Patient was empirically placed on LVQ for MDR Stenotrophomonas identified on 11/26/20 UCx which was only LVQ susceptible. Cultures from this admission with MDR Stenotrophomonas which is LVQ R and Bactrim R. ID has been consulted. Pharmacy has been consulted for Avycaz and aztreonam dosing.  per micro lab at [facility] - only susceptibilities for Bactrim and LVQ were reported by [facility]. Per ID recommendations, have requested susceptibilities for minocycline/tetracycline, aztreonam, and colistin to be sent to [facility].  This will take at least 7-10 days.    5/27: Per micro lab in correspondence from [facility]:Unable to run susceptibilities for aztreonam and colistin. Susceptibilities for minocycline/tetracycline are in process. (see RPh progress note)   Today, 12/26/2020  Day #8 antiibotics  Renal function improving Scr 1.37>1.31  WBC WNL   afebrile  Plan: continue doses as below:   Avycaz 1.25g IV q8h (3-hr infusion) -- renally adjusted  Aztreonam 2 g IV q8h (3-hr infusion) -- renally adjusted  Antibiotics timed to be given simultaneously   Await further susceptibility testing to guide therapy  As per ID,  Will likely need to complete therapy as inpatient  Height: 5\' 7"  (170.2 cm) Weight: 72.4 kg (159 lb 9.8 oz) IBW/kg (Calculated) : 66.1  Temp (24hrs), Avg:98.3 F (36.8 C), Min:98 F (36.7 C), Max:98.8 F (37.1 C)  Recent Labs  Lab 12/20/20 0539 12/22/20 0538 12/24/20 0411 12/25/20 0453 12/26/20 0819  WBC 7.7 8.3 8.4  --   --   CREATININE 1.68* 1.57* 1.43* 1.37* 1.31*    Estimated Creatinine Clearance: 37.1 mL/min (A) (by C-G formula based on  SCr of 1.31 mg/dL (H)).    Allergies  Allergen Reactions  . Augmentin [Amoxicillin-Pot Clavulanate] Diarrhea    Led to C-diff     Antimicrobials this admission: Levofloxacin 5/18 >> 5/20 Avycaz 5/21 >>  Aztreonam 5/21 >>   Dose adjustments this admission: N/A  Microbiology results: 5/18 BCx: NGTD 5/18 UCx: NG 5/18 UCx (L renal pelvis): MDR Stenotrophomonas maltophilia 5/18 UCx (L renal pelvis): GNR, pending   Thank you for allowing pharmacy to be a part of this patient's care.  Noralee Space, PharmD Clinical Pharmacist 12/26/2020 2:23 PM

## 2020-12-27 LAB — CREATININE, SERUM
Creatinine, Ser: 1.15 mg/dL (ref 0.61–1.24)
GFR, Estimated: >60 mL/min (ref >60)

## 2020-12-27 NOTE — Consult Note (Signed)
Pharmacy Antibiotic Note  David Myers is a 87 y.o. male with medical history including TURP 10/2020, diabetes, kidney stones, history of CVA, OSA, recurrent UTI / prostatitis since TURP admitted on 12/16/2020 with obstructive nephrolithasis / new left hydronephrosis. Urology performed urgent cystoscopy and stent placement on 5/18. Patient was empirically placed on LVQ for MDR Stenotrophomonas identified on 11/26/20 UCx which was only LVQ susceptible. Cultures from this admission with MDR Stenotrophomonas which is LVQ R and Bactrim R. ID has been consulted. Pharmacy has been consulted for Avycaz and aztreonam dosing.  per micro lab at [facility] - only susceptibilities for Bactrim and LVQ were reported by [facility]. Per ID recommendations, have requested susceptibilities for minocycline/tetracycline, aztreonam, and colistin to be sent to [facility].  This will take at least 7-10 days.    5/27 RPh note: Per micro lab in correspondence from [facility]:Unable to run susceptibilities for aztreonam and colistin. Susceptibilities for minocycline/tetracycline are in process. (see RPh progress note)   Today, 12/27/2020  Day #9 antiibotics  Renal function improving Scr 1.37>1.31>1.15  WBC WNL   afebrile  Plan: continue doses as below:   Avycaz 1.25g IV q8h (3-hr infusion) -- renally adjusted  Aztreonam 2 g IV q8h (3-hr infusion) -- renally adjusted  Antibiotics timed to be given simultaneously   Await further susceptibility testing to guide therapy  As per ID,  Will likely need to complete therapy as inpatient  Height: 5\' 7"  (170.2 cm) Weight: 72.4 kg (159 lb 9.8 oz) IBW/kg (Calculated) : 66.1  Temp (24hrs), Avg:98.2 F (36.8 C), Min:98 F (36.7 C), Max:98.5 F (36.9 C)  Recent Labs  Lab 12/22/20 0538 12/24/20 0411 12/25/20 0453 12/26/20 0819 12/27/20 0507  WBC 8.3 8.4  --   --   --   CREATININE 1.57* 1.43* 1.37* 1.31* 1.15    Estimated Creatinine Clearance: 42.3 mL/min (by C-G formula  based on SCr of 1.15 mg/dL).    Allergies  Allergen Reactions  . Augmentin [Amoxicillin-Pot Clavulanate] Diarrhea    Led to C-diff     Antimicrobials this admission: Levofloxacin 5/18 >> 5/20 Avycaz 5/21 >>  Aztreonam 5/21 >>   Dose adjustments this admission: N/A  Microbiology results: 5/18 BCx: NGTD 5/18 UCx: NG 5/18 UCx (L renal pelvis): MDR Stenotrophomonas maltophilia 5/18 UCx (L renal pelvis): GNR, pending   Thank you for allowing pharmacy to be a part of this patient's care.  Noralee Space, PharmD Clinical Pharmacist 12/27/2020 11:29 AM

## 2020-12-27 NOTE — Progress Notes (Signed)
[facility] PROGRESS NOTE   David Myers [place] [facility]:[id number] DOB: 12-09-1931 DOA: 12/16/2020  PCP: Rusty Aus, MD  Brief History/Interval Summary: 87 y.o.malewho is status post TURP in 04/2022by Dr Bernardo Heater. Other comorbidities include diabetes mellitus, kidney stones, history of stroke,obstructive sleep apnea not compliant with CPAP. As per wife, post TURP, patient has been having recurrent UTI/prostatitis and has been on and off antibiotics for several weeks. He was last on levofloxacin for treatment of recurrent UTI but continued to have persistent fever with associated lower abdominal discomfort. Patient was hospitalized after he was seen at urology office.  CT scan was done which showed new left hydronephrosis with obstructive nephrolithiasis.  Patient underwent cystoscopy and stent placement on 5/18.  Urine culture positive for Stenotrophomonas maltophilia with multiple drug resistance.  ID was consulted.  Consultants: Infectious disease.  Urology.  Procedures:   Cystoscopy and ureteral stent placement on 5/18   Antibiotics: Anti-infectives (From admission, onward)   Start     Dose/Rate Route Frequency Ordered Stop   12/22/20 1200  aztreonam (AZACTAM) 2 g in sodium chloride 0.9 % 100 mL IVPB        2 g 33.3 mL/hr over 180 Minutes Intravenous Every 8 hours 12/22/20 0852     12/22/20 1200  ceftazidime-avibactam (AVYCAZ) 1.25 g in dextrose 5 % 50 mL IVPB        1.25 g 16.67 mL/hr over 3 Hours Intravenous Every 8 hours 12/22/20 0852     12/19/20 1230  ceftazidime-avibactam (AVYCAZ) 0.94 g in dextrose 5 % 50 mL IVPB  Status:  Discontinued        0.94 g 16.67 mL/hr over 3 Hours Intravenous Every 12 hours 12/19/20 1132 12/22/20 0852   12/19/20 1230  aztreonam (AZACTAM) 2 g in sodium chloride 0.9 % 100 mL IVPB  Status:  Discontinued        2 g 33.3 mL/hr over 180 Minutes Intravenous Every 12 hours 12/19/20 1132 12/22/20 0852   12/18/20 2245  levofloxacin (LEVAQUIN)  IVPB 500 mg  Status:  Discontinued       "Followed by" Linked Group Details   500 mg 100 mL/hr over 60 Minutes Intravenous Every 48 hours 12/16/20 2147 12/19/20 1123   12/17/20 0000  cefTRIAXone (ROCEPHIN) 2 g in sodium chloride 0.9 % 100 mL IVPB  Status:  Discontinued        2 g 200 mL/hr over 30 Minutes Intravenous Every 24 hours 12/16/20 2110 12/16/20 2112   12/16/20 2245  levofloxacin (LEVAQUIN) IVPB 750 mg       "Followed by" Linked Group Details   750 mg 100 mL/hr over 90 Minutes Intravenous  Once 12/16/20 2147 12/16/20 2359   12/16/20 2115  levofloxacin (LEVAQUIN) IVPB 500 mg  Status:  Discontinued        500 mg 100 mL/hr over 60 Minutes Intravenous Every 24 hours 12/16/20 2112 12/16/20 2147   12/16/20 1745  cefTRIAXone (ROCEPHIN) 1 g in sodium chloride 0.9 % 100 mL IVPB        1 g 200 mL/hr over 30 Minutes Intravenous  Once 12/16/20 1742 12/16/20 1836      Subjective/Interval History: Patient without any complaints.  Urinating regularly.  Having daily bowel movements.   Assessment/Plan:  Acute left pyelonephritis with multidrug-resistant stenotrophomonas infection Patient was seen by infectious disease and urology.  Patient remains on aztreonam and ceftazidime/avibactam.  Plan is for him to remain on these antibiotics till 6/1.   Will need to stay in the  hospital while he is getting these antibiotics as there are no outpatient treatment options available at this time. We will involve ID again on 5/31 to finalize antibiotic plan and discharge.  Nephrolithiasis with obstruction Status post cystoscopy and ureteral stent placement ON 5/18.  Urology has been following.   Plan is for left ureteroscopy with laser lithotripsy and ureteral stent exchange on 5/31.  Acute kidney injury on chronic kidney disease stage IV Baseline creatinine around 1.2.  Presented with creatinine of 2.71.   Patient renal function started improving after urological intervention and IV fluids.     Essential hypertension We had to hold patient's antihypertensives due to borderline low blood pressure.  Blood pressure is low but stable.  He is asymptomatic.  Normocytic anemia Hemoglobin stable.  Continue to monitor.   DVT Prophylaxis: Subcutaneous heparin Code Status: Full code Family Communication: Discussed with patient and his wife Disposition Plan: He will be in the hospital at least June 1.  Plan to go home subsequently.  Continue to mobilize.  Status is: Inpatient  Remains inpatient appropriate because:IV treatments appropriate due to intensity of illness or inability to take PO   Dispo: The patient is from: Home              Anticipated d/c is to: Home              Patient currently is not medically stable to d/c.   Difficult to place patient No      Medications:  Scheduled: . acidophilus  1 capsule Oral Daily  . aspirin EC  81 mg Oral Daily  . Chlorhexidine Gluconate Cloth  6 each Topical Daily  . cholecalciferol  1,000 Units Oral Daily  . docusate sodium  100 mg Oral BID  . famotidine  20 mg Oral QHS  . heparin  5,000 Units Subcutaneous Q8H  . insulin aspart  0-6 Units Subcutaneous TID WC  . Netarsudil-Latanoprost  1 drop Ophthalmic Daily  . pantoprazole  40 mg Oral QAC breakfast  . simvastatin  20 mg Oral QHS  . tamsulosin  0.4 mg Oral Daily   Continuous: . sodium chloride 250 mL (12/26/20 2320)  . aztreonam 2 g (12/27/20 0610)  . ceftazidime avibactam (AVYCAZ) IVPB 1.25 g (12/27/20 9381)   WEX:HBZJIR chloride, acetaminophen **OR** acetaminophen, bisacodyl, ondansetron **OR** ondansetron (ZOFRAN) IV   Objective:  Vital Signs  Vitals:   12/26/20 0831 12/26/20 1541 12/26/20 2051 12/27/20 0546  BP: (!) 106/59 105/68 104/71 (!) 91/58  Pulse: 64 68 66 67  Resp: 18 18 19 16   Temp: 98.6 F (37 C) 98 F (36.7 C) 98.5 F (36.9 C) 98 F (36.7 C)  TempSrc:   Oral   SpO2: 90% 94% 91% 96%  Weight:      Height:        Intake/Output Summary  (Last 24 hours) at 12/27/2020 0945 Last data filed at 12/27/2020 0545 Gross per 24 hour  Intake --  Output 600 ml  Net -600 ml   Filed Weights   12/16/20 2200  Weight: 72.4 kg    General appearance: Awake alert.  In no distress Resp: Clear to auscultation bilaterally.  Normal effort Cardio: S1-S2 is normal regular.  No S3-S4.  No rubs murmurs or bruit GI: Abdomen is soft.  Nontender nondistended.  Bowel sounds are present normal.  No masses organomegaly   Lab Results:  Data Reviewed: I have personally reviewed following labs and imaging studies  CBC: Recent Labs  Lab 12/22/20 0538  12/24/20 0411  WBC 8.3 8.4  HGB 11.6* 11.3*  HCT 33.1* 32.9*  MCV 89.7 92.9  PLT 304 142    Basic Metabolic Panel: Recent Labs  Lab 12/22/20 0538 12/24/20 0411 12/25/20 0453 12/26/20 0819 12/27/20 0507  NA 135 138 136  --   --   K 4.3 5.1 4.3  --   --   CL 103 106 108  --   --   CO2 23 24 21*  --   --   GLUCOSE 115* 118* 122*  --   --   BUN 42* 47* 42*  --   --   CREATININE 1.57* 1.43* 1.37* 1.31* 1.15  CALCIUM 8.8* 9.0 8.6*  --   --     GFR: Estimated Creatinine Clearance: 42.3 mL/min (by C-G formula based on SCr of 1.15 mg/dL).   CBG: Recent Labs  Lab 12/24/20 1659 12/24/20 2037 12/25/20 0739 12/25/20 1146 12/25/20 1644  GLUCAP 138* 166* 130* 184* 191*     No results found for this or any previous visit (from the past 240 hour(s)).    Radiology Studies: No results found.     LOS: 11 days   Clotee Schlicker [employer] on [email or web address]  12/27/2020, 9:45 AM

## 2020-12-27 NOTE — Plan of Care (Signed)

## 2020-12-28 DIAGNOSIS — N390 Urinary tract infection, site not specified: Secondary | ICD-10-CM | POA: Diagnosis not present

## 2020-12-28 LAB — CREATININE, SERUM
Creatinine, Ser: 1.15 mg/dL (ref 0.61–1.24)
GFR, Estimated: >60 mL/min (ref >60)

## 2020-12-28 NOTE — Progress Notes (Signed)
[facility] PROGRESS NOTE   David Myers MVE:[id number] DOB: [birth year] DOA: 12/16/2020  PCP: Rusty Aus, MD  Brief History/Interval Summary: 87 y.o.malewho is status post TURP in 04/2022by Dr Bernardo Heater. Other comorbidities include diabetes mellitus, kidney stones, history of stroke,obstructive sleep apnea not compliant with CPAP. As per wife, post TURP, patient has been having recurrent UTI/prostatitis and has been on and off antibiotics for several weeks. He was last on levofloxacin for treatment of recurrent UTI but continued to have persistent fever with associated lower abdominal discomfort. Patient was hospitalized after he was seen at urology office.  CT scan was done which showed new left hydronephrosis with obstructive nephrolithiasis.  Patient underwent cystoscopy and stent placement on 5/18.  Urine culture positive for Stenotrophomonas maltophilia with multiple drug resistance.  ID was consulted.  Consultants: Infectious disease.  Urology.  Procedures:   Cystoscopy and ureteral stent placement on 5/18   Antibiotics: Anti-infectives (From admission, onward)   Start     Dose/Rate Route Frequency Ordered Stop   12/22/20 1200  aztreonam (AZACTAM) 2 g in sodium chloride 0.9 % 100 mL IVPB        2 g 33.3 mL/hr over 180 Minutes Intravenous Every 8 hours 12/22/20 0852     12/22/20 1200  ceftazidime-avibactam (AVYCAZ) 1.25 g in dextrose 5 % 50 mL IVPB        1.25 g 16.67 mL/hr over 3 Hours Intravenous Every 8 hours 12/22/20 0852     12/19/20 1230  ceftazidime-avibactam (AVYCAZ) 0.94 g in dextrose 5 % 50 mL IVPB  Status:  Discontinued        0.94 g 16.67 mL/hr over 3 Hours Intravenous Every 12 hours 12/19/20 1132 12/22/20 0852   12/19/20 1230  aztreonam (AZACTAM) 2 g in sodium chloride 0.9 % 100 mL IVPB  Status:  Discontinued        2 g 33.3 mL/hr over 180 Minutes Intravenous Every 12 hours 12/19/20 1132 12/22/20 0852   12/18/20 2245  levofloxacin (LEVAQUIN)  IVPB 500 mg  Status:  Discontinued       "Followed by" Linked Group Details   500 mg 100 mL/hr over 60 Minutes Intravenous Every 48 hours 12/16/20 2147 12/19/20 1123   12/17/20 0000  cefTRIAXone (ROCEPHIN) 2 g in sodium chloride 0.9 % 100 mL IVPB  Status:  Discontinued        2 g 200 mL/hr over 30 Minutes Intravenous Every 24 hours 12/16/20 2110 12/16/20 2112   12/16/20 2245  levofloxacin (LEVAQUIN) IVPB 750 mg       "Followed by" Linked Group Details   750 mg 100 mL/hr over 90 Minutes Intravenous  Once 12/16/20 2147 12/16/20 2359   12/16/20 2115  levofloxacin (LEVAQUIN) IVPB 500 mg  Status:  Discontinued        500 mg 100 mL/hr over 60 Minutes Intravenous Every 24 hours 12/16/20 2112 12/16/20 2147   12/16/20 1745  cefTRIAXone (ROCEPHIN) 1 g in sodium chloride 0.9 % 100 mL IVPB        1 g 200 mL/hr over 30 Minutes Intravenous  Once 12/16/20 1742 12/16/20 1836      Subjective/Interval History: Patient denies any abdominal pain nausea or vomiting.  Urinating well.  Having regular bowel movements.  His wife is at the bedside.     Assessment/Plan:  Acute left pyelonephritis with multidrug-resistant stenotrophomonas infection Patient was seen by infectious disease and urology.  Patient remains on aztreonam and ceftazidime/avibactam.  Plan is for him to remain  on these antibiotics till 6/1.   Will need to stay in the hospital while he is getting these antibiotics as there are no outpatient treatment options available at this time. We will involve ID again on 5/31 to finalize antibiotic plan and discharge.  Nephrolithiasis with obstruction Status post cystoscopy and ureteral stent placement ON 5/18.  Urology has been following.   Plan is for left ureteroscopy with laser lithotripsy and ureteral stent exchange on 5/31.  Acute kidney injury on chronic kidney disease stage IV Baseline creatinine around 1.2.  Presented with creatinine of 2.71.   Patient renal function started improving  after urological intervention and IV fluids.    Essential hypertension We had to hold patient's antihypertensives due to borderline low blood pressure.  Blood pressure is low but stable.  He is asymptomatic.  Normocytic anemia Hemoglobin stable.  Continue to monitor.   DVT Prophylaxis: Subcutaneous heparin Code Status: Full code Family Communication: Discussed with patient and his wife Disposition Plan: He will be in the hospital at least June 1.  Plan to go home subsequently.  Continue to mobilize.  Status is: Inpatient  Remains inpatient appropriate because:IV treatments appropriate due to intensity of illness or inability to take PO   Dispo: The patient is from: Home              Anticipated d/c is to: Home              Patient currently is not medically stable to d/c.   Difficult to place patient No      Medications:  Scheduled: . acidophilus  1 capsule Oral Daily  . aspirin EC  81 mg Oral Daily  . Chlorhexidine Gluconate Cloth  6 each Topical Daily  . cholecalciferol  1,000 Units Oral Daily  . docusate sodium  100 mg Oral BID  . famotidine  20 mg Oral QHS  . heparin  5,000 Units Subcutaneous Q8H  . insulin aspart  0-6 Units Subcutaneous TID WC  . Netarsudil-Latanoprost  1 drop Ophthalmic Daily  . pantoprazole  40 mg Oral QAC breakfast  . simvastatin  20 mg Oral QHS  . tamsulosin  0.4 mg Oral Daily   Continuous: . sodium chloride 250 mL (12/26/20 2320)  . aztreonam 2 g (12/28/20 0520)  . ceftazidime avibactam (AVYCAZ) IVPB 1.25 g (12/28/20 0518)   HQI:ONGEXB chloride, acetaminophen **OR** acetaminophen, bisacodyl, ondansetron **OR** ondansetron (ZOFRAN) IV   Objective:  Vital Signs  Vitals:   12/27/20 2021 12/28/20 0005 12/28/20 0320 12/28/20 0821  BP: 99/70 106/69 110/67 107/69  Pulse: 67 (!) 56 (!) 55 60  Resp: 16 16 15 17   Temp: 98.5 F (36.9 C) 98.9 F (37.2 C) 98 F (36.7 C) 97.6 F (36.4 C)  TempSrc: Oral  Oral Oral  SpO2: 94% 96% 95% 92%   Weight:      Height:        Intake/Output Summary (Last 24 hours) at 12/28/2020 1102 Last data filed at 12/28/2020 0329 Gross per 24 hour  Intake --  Output 1350 ml  Net -1350 ml   Filed Weights   12/16/20 2200  Weight: 72.4 kg    General appearance: Awake alert.  In no distress Resp: Clear to auscultation bilaterally.  Normal effort Cardio: S1-S2 is normal regular.  No S3-S4.  No rubs murmurs or bruit GI: Abdomen is soft.  Nontender nondistended.  Bowel sounds are present normal.  No masses organomegaly   Lab Results:  Data Reviewed: I have personally reviewed  following labs and imaging studies  CBC: Recent Labs  Lab 12/22/20 0538 12/24/20 0411  WBC 8.3 8.4  HGB 11.6* 11.3*  HCT 33.1* 32.9*  MCV 89.7 92.9  PLT 304 567    Basic Metabolic Panel: Recent Labs  Lab 12/22/20 0538 12/24/20 0411 12/25/20 0453 12/26/20 0819 12/27/20 0507 12/28/20 0542  NA 135 138 136  --   --   --   K 4.3 5.1 4.3  --   --   --   CL 103 106 108  --   --   --   CO2 23 24 21*  --   --   --   GLUCOSE 115* 118* 122*  --   --   --   BUN 42* 47* 42*  --   --   --   CREATININE 1.57* 1.43* 1.37* 1.31* 1.15 1.15  CALCIUM 8.8* 9.0 8.6*  --   --   --     GFR: Estimated Creatinine Clearance: 42.3 mL/min (by C-G formula based on SCr of 1.15 mg/dL).   CBG: Recent Labs  Lab 12/24/20 1659 12/24/20 2037 12/25/20 0739 12/25/20 1146 12/25/20 1644  GLUCAP 138* 166* 130* 184* 191*     No results found for this or any previous visit (from the past 240 hour(s)).    Radiology Studies: No results found.     LOS: 12 days   Jezabelle Chisolm [employer] on [email or web address]  12/28/2020, 11:02 AM

## 2020-12-28 NOTE — Progress Notes (Signed)
   Date of Admission:  12/16/2020    ID: David Myers is a 87 y.o. male  Active Problems:   Kidney stone   Pyelonephritis   Acute cystitis with hematuria   AKI (acute kidney injury) ([place])  Medical History 11/17/20 TURP 11/23/20 -Foley catheter removed as OP 11/26/20- Stenotrophomonas in urine - S to levofloxacin- R to trim/sulfa and ceftazidime  12/08/20- UC - R stenotrophomonas  Subjective: Feeling better No pain abdomen Says urine is clear No dysuria or hematuria  Medications:  . acidophilus  1 capsule Oral Daily  . aspirin EC  81 mg Oral Daily  . Chlorhexidine Gluconate Cloth  6 each Topical Daily  . cholecalciferol  1,000 Units Oral Daily  . docusate sodium  100 mg Oral BID  . famotidine  20 mg Oral QHS  . heparin  5,000 Units Subcutaneous Q8H  . insulin aspart  0-6 Units Subcutaneous TID WC  . Netarsudil-Latanoprost  1 drop Ophthalmic Daily  . pantoprazole  40 mg Oral QAC breakfast  . simvastatin  20 mg Oral QHS  . tamsulosin  0.4 mg Oral Daily    Objective: Vital signs in last 24 hours: Temp:  [97.6 F (36.4 C)-98.9 F (37.2 C)] 97.8 F (36.6 C) (05/30 1204) Pulse Rate:  [55-68] 68 (05/30 1204) Resp:  [15-17] 17 (05/30 1204) BP: (99-110)/(67-70) 109/67 (05/30 1204) SpO2:  [92 %-96 %] 94 % (05/30 1204)  PHYSICAL EXAM:  General: Alert, cooperative, no distress, appears stated age.  Head: Normocephalic, without obvious abnormality, atraumatic. Eyes: Conjunctivae clear, anicteric sclerae. Pupils are equal ENT Nares normal. No drainage or sinus tenderness. Lips, mucosa, and tongue normal. No Thrush Neck: Supple, symmetrical, no adenopathy, thyroid: non tender no carotid bruit and no JVD. Back: No CVA tenderness. Lungs: Clear to auscultation bilaterally. No Wheezing or Rhonchi. No rales. Heart:irregular Abdomen: Soft, non-tender,not distended. Bowel sounds normal. No masses Extremities: atraumatic, no cyanosis. No edema. No clubbing Skin: No rashes or  lesions. Or bruising Lymph: Cervical, supraclavicular normal. Neurologic: Grossly non-focal  Lab Results Recent Labs    12/27/20 0507 12/28/20 0542  CREATININE 1.15 1.15   Liver Panel No results for input(s): PROT, ALBUMIN, AST, ALT, ALKPHOS, BILITOT, BILIDIR, IBILI in the last 72 hours. Sedimentation Rate No results for input(s): ESRSEDRATE in the last 72 hours. C-Reactive Protein No results for input(s): CRP in the last 72 hours.  Microbiology:  Studies/Results: No results found.   Assessment/Plan: Multidrug-resistant stenotrophomonas complicated urinary tract infection. Recent TURP followed by prostatitis, now admitted with left hydronephrosis secondary to ureteric calculi in the distal left ureter.  Status post stent placement. Currently on aztreonam and Mallard for stone removal tomorrow. Susceptibility for stenotrophomonas has been sent to [facility] We will repeat urine culture to see whether there is clearance of bacteria. Duration of antibiotic depends on repeat culture result and clinical course..very likely we may be able to stop IV 24-48 hrs after removal of stone if repeat urine culture is negative. Discussed the management with patient and his wife

## 2020-12-29 ENCOUNTER — Encounter
Admission: EM | Disposition: A | Discharge status: Home / Self Care | Payer: Self-pay | Source: Home / Self Care | Attending: Internal Medicine

## 2020-12-29 ENCOUNTER — Inpatient Hospital Stay: Payer: PPO | Admitting: Anesthesiology

## 2020-12-29 ENCOUNTER — Encounter: Payer: Self-pay | Admitting: Internal Medicine

## 2020-12-29 ENCOUNTER — Inpatient Hospital Stay: Payer: PPO

## 2020-12-29 DIAGNOSIS — N179 Acute kidney failure, unspecified: Secondary | ICD-10-CM | POA: Diagnosis not present

## 2020-12-29 DIAGNOSIS — N12 Tubulo-interstitial nephritis, not specified as acute or chronic: Secondary | ICD-10-CM | POA: Diagnosis not present

## 2020-12-29 DIAGNOSIS — N202 Calculus of kidney with calculus of ureter: Secondary | ICD-10-CM

## 2020-12-29 HISTORY — PX: CYSTOSCOPY/URETEROSCOPY/HOLMIUM LASER/STENT PLACEMENT: SHX6546

## 2020-12-29 LAB — CBC
HCT: 31.3 % — ABNORMAL LOW (ref 39.0–52.0)
Hemoglobin: 10.8 g/dL — ABNORMAL LOW (ref 13.0–17.0)
MCH: 31.3 pg (ref 26.0–34.0)
MCHC: 34.5 g/dL (ref 30.0–36.0)
MCV: 90.7 fL (ref 80.0–100.0)
Platelets: 225 10*3/uL (ref 150–400)
RBC: 3.45 MIL/uL — ABNORMAL LOW (ref 4.22–5.81)
RDW: 13.8 % (ref 11.5–15.5)
WBC: 7.1 10*3/uL (ref 4.0–10.5)
nRBC: 0 % (ref 0.0–0.2)

## 2020-12-29 LAB — GLUCOSE, CAPILLARY
Glucose-Capillary: 142 mg/dL — ABNORMAL HIGH (ref 70–99)
Glucose-Capillary: 155 mg/dL — ABNORMAL HIGH (ref 70–99)
Glucose-Capillary: 132 mg/dL — ABNORMAL HIGH (ref 70–99)
Glucose-Capillary: 145 mg/dL — ABNORMAL HIGH (ref 70–99)
Glucose-Capillary: 137 mg/dL — ABNORMAL HIGH (ref 70–99)
Glucose-Capillary: 125 mg/dL — ABNORMAL HIGH (ref 70–99)
Glucose-Capillary: 146 mg/dL — ABNORMAL HIGH (ref 70–99)
Glucose-Capillary: 208 mg/dL — ABNORMAL HIGH (ref 70–99)
Glucose-Capillary: 117 mg/dL — ABNORMAL HIGH (ref 70–99)
Glucose-Capillary: 134 mg/dL — ABNORMAL HIGH (ref 70–99)
Glucose-Capillary: 143 mg/dL — ABNORMAL HIGH (ref 70–99)
Glucose-Capillary: 128 mg/dL — ABNORMAL HIGH (ref 70–99)
Glucose-Capillary: 121 mg/dL — ABNORMAL HIGH (ref 70–99)
Glucose-Capillary: 109 mg/dL — ABNORMAL HIGH (ref 70–99)
Glucose-Capillary: 106 mg/dL — ABNORMAL HIGH (ref 70–99)
Glucose-Capillary: 174 mg/dL — ABNORMAL HIGH (ref 70–99)

## 2020-12-29 LAB — BASIC METABOLIC PANEL
Anion gap: 9 (ref 5–15)
BUN: 37 mg/dL — ABNORMAL HIGH (ref 8–23)
CO2: 20 mmol/L — ABNORMAL LOW (ref 22–32)
Calcium: 8.8 mg/dL — ABNORMAL LOW (ref 8.9–10.3)
Chloride: 110 mmol/L (ref 98–111)
Creatinine, Ser: 1.26 mg/dL — ABNORMAL HIGH (ref 0.61–1.24)
GFR, Estimated: 55 mL/min — ABNORMAL LOW (ref >60)
Glucose, Bld: 96 mg/dL (ref 70–99)
Potassium: 3.7 mmol/L (ref 3.5–5.1)
Sodium: 139 mmol/L (ref 135–145)

## 2020-12-29 SURGERY — CYSTOSCOPY/URETEROSCOPY/HOLMIUM LASER/STENT PLACEMENT
Anesthesia: General | Laterality: Left

## 2020-12-29 MED ORDER — LACTATED RINGERS IV SOLN: INTRAVENOUS | Status: DC | PRN

## 2020-12-29 MED ORDER — PROPOFOL 10 MG/ML IV BOLUS
INTRAVENOUS | Status: AC
  Filled 2020-12-29: qty 20

## 2020-12-29 MED ORDER — OXYCODONE HCL 5 MG/5ML PO SOLN: 5.0000 mg | Freq: Once | ORAL | Status: DC | PRN

## 2020-12-29 MED ORDER — IOPAMIDOL (ISOVUE-200) INJECTION 41%
INTRAVENOUS | Status: DC | PRN
  Administered 2020-12-29: 5 mL via INTRAVENOUS

## 2020-12-29 MED ORDER — FENTANYL CITRATE (PF) 100 MCG/2ML IJ SOLN
INTRAMUSCULAR | Status: AC
  Filled 2020-12-29: qty 2

## 2020-12-29 MED ORDER — OXYCODONE HCL 5 MG PO TABS: 5.0000 mg | ORAL_TABLET | Freq: Once | ORAL | Status: DC | PRN

## 2020-12-29 MED ORDER — EPHEDRINE 5 MG/ML INJ
INTRAVENOUS | Status: AC
  Filled 2020-12-29: qty 10

## 2020-12-29 MED ORDER — EPHEDRINE SULFATE 50 MG/ML IJ SOLN
INTRAMUSCULAR | Status: DC | PRN
  Administered 2020-12-29 (×2): 10 mg via INTRAVENOUS

## 2020-12-29 MED ORDER — FENTANYL CITRATE (PF) 100 MCG/2ML IJ SOLN: 25.0000 ug | INTRAMUSCULAR | Status: DC | PRN

## 2020-12-29 MED ORDER — ONDANSETRON HCL 4 MG/2ML IJ SOLN
INTRAMUSCULAR | Status: DC | PRN
  Administered 2020-12-29: 4 mg via INTRAVENOUS

## 2020-12-29 MED ORDER — LIDOCAINE HCL (CARDIAC) PF 100 MG/5ML IV SOSY
PREFILLED_SYRINGE | INTRAVENOUS | Status: DC | PRN
  Administered 2020-12-29: 80 mg via INTRAVENOUS

## 2020-12-29 MED ORDER — SUGAMMADEX SODIUM 200 MG/2ML IV SOLN
INTRAVENOUS | Status: DC | PRN
  Administered 2020-12-29: 200 mg via INTRAVENOUS

## 2020-12-29 MED ORDER — PHENYLEPHRINE HCL-NACL 10-0.9 MG/250ML-% IV SOLN
INTRAVENOUS | Status: DC | PRN
  Administered 2020-12-29: 50 ug/min via INTRAVENOUS

## 2020-12-29 MED ORDER — ACETAMINOPHEN 10 MG/ML IV SOLN
INTRAVENOUS | Status: AC
  Filled 2020-12-29: qty 100

## 2020-12-29 MED ORDER — ROCURONIUM BROMIDE 10 MG/ML (PF) SYRINGE
PREFILLED_SYRINGE | INTRAVENOUS | Status: AC
  Filled 2020-12-29: qty 10

## 2020-12-29 MED ORDER — PHENYLEPHRINE HCL (PRESSORS) 10 MG/ML IV SOLN
INTRAVENOUS | Status: DC | PRN
  Administered 2020-12-29 (×3): 100 ug via INTRAVENOUS

## 2020-12-29 MED ORDER — FENTANYL CITRATE (PF) 100 MCG/2ML IJ SOLN
INTRAMUSCULAR | Status: DC | PRN
  Administered 2020-12-29: 50 ug via INTRAVENOUS
  Administered 2020-12-29: 12.5 ug via INTRAVENOUS

## 2020-12-29 MED ORDER — SODIUM CHLORIDE 0.9 % IV BOLUS
250.0000 mL | Freq: Once | INTRAVENOUS | Status: AC
  Administered 2020-12-29: 250 mL via INTRAVENOUS

## 2020-12-29 MED ORDER — LIDOCAINE HCL (PF) 2 % IJ SOLN
INTRAMUSCULAR | Status: AC
  Filled 2020-12-29: qty 5

## 2020-12-29 MED ORDER — PHENAZOPYRIDINE HCL 100 MG PO TABS: 100.0000 mg | ORAL_TABLET | Freq: Three times a day (TID) | ORAL | Status: DC | PRN

## 2020-12-29 MED ORDER — ACETAMINOPHEN 10 MG/ML IV SOLN
INTRAVENOUS | Status: DC | PRN
  Administered 2020-12-29: 1000 mg via INTRAVENOUS

## 2020-12-29 MED ORDER — PROPOFOL 10 MG/ML IV BOLUS
INTRAVENOUS | Status: DC | PRN
  Administered 2020-12-29: 100 mg via INTRAVENOUS

## 2020-12-29 MED ORDER — ROCURONIUM BROMIDE 100 MG/10ML IV SOLN
INTRAVENOUS | Status: DC | PRN
  Administered 2020-12-29: 20 mg via INTRAVENOUS
  Administered 2020-12-29: 50 mg via INTRAVENOUS

## 2020-12-29 SURGICAL SUPPLY — 29 items
BAG DRAIN CYSTO-URO LG1000N (MISCELLANEOUS) ×2 IMPLANT
BASKET ZERO TIP 1.9FR (BASKET) ×1 IMPLANT
BRUSH SCRUB EZ 1% IODOPHOR (MISCELLANEOUS) ×2 IMPLANT
BSKT STON RTRVL ZERO TP 1.9FR (BASKET) ×1
CATH URET FLEX-TIP 2 LUMEN 10F (CATHETERS) IMPLANT
CATH URETL 5X70 OPEN END (CATHETERS) IMPLANT
CNTNR SPEC 2.5X3XGRAD LEK (MISCELLANEOUS) ×1
CONT SPEC 4OZ STER OR WHT (MISCELLANEOUS) ×1
CONT SPEC 4OZ STRL OR WHT (MISCELLANEOUS) ×1
CONTAINER SPEC 2.5X3XGRAD LEK (MISCELLANEOUS) IMPLANT
DRAPE UTILITY 15X26 TOWEL STRL (DRAPES) ×2 IMPLANT
GLOVE SURG UNDER POLY LF SZ7.5 (GLOVE) ×2 IMPLANT
GOWN STRL REUS W/ TWL LRG LVL3 (GOWN DISPOSABLE) ×1 IMPLANT
GOWN STRL REUS W/ TWL XL LVL3 (GOWN DISPOSABLE) ×1 IMPLANT
GOWN STRL REUS W/TWL LRG LVL3 (GOWN DISPOSABLE) ×2
GOWN STRL REUS W/TWL XL LVL3 (GOWN DISPOSABLE) ×2
GUIDEWIRE STR DUAL SENSOR (WIRE) ×2 IMPLANT
INFUSOR MANOMETER BAG 3000ML (MISCELLANEOUS) ×2 IMPLANT
IV NS IRRIG 3000ML ARTHROMATIC (IV SOLUTION) ×2 IMPLANT
KIT TURNOVER CYSTO (KITS) ×2 IMPLANT
PACK CYSTO AR (MISCELLANEOUS) ×2 IMPLANT
SET CYSTO W/LG BORE CLAMP LF (SET/KITS/TRAYS/PACK) ×2 IMPLANT
SHEATH URETERAL 12FRX35CM (MISCELLANEOUS) ×1 IMPLANT
STENT URET 6FRX24 CONTOUR (STENTS) ×1 IMPLANT
STENT URET 6FRX26 CONTOUR (STENTS) IMPLANT
SURGILUBE 2OZ TUBE FLIPTOP (MISCELLANEOUS) ×2 IMPLANT
TRACTIP FLEXIVA PULSE ID 200 (Laser) ×2 IMPLANT
VALVE UROSEAL ADJ ENDO (VALVE) ×1 IMPLANT
WATER STERILE IRR 1000ML POUR (IV SOLUTION) ×2 IMPLANT

## 2020-12-29 NOTE — Transfer of Care (Signed)
Immediate Anesthesia Transfer of Care Note  Patient: David Myers  Procedure(s) Performed: CYSTOSCOPY/URETEROSCOPY/HOLMIUM LASER/STENT PLACEMENT (Left )  Patient Location: PACU  Anesthesia Type:General  Level of Consciousness: drowsy  Airway & Oxygen Therapy: Patient Spontanous Breathing and Patient connected to face mask oxygen  Post-op Assessment: Report given to RN and Post -op Vital signs reviewed and stable  Post vital signs: stable  Last Vitals:  Vitals Value Taken Time  BP 116/51 12/29/20 1310  Temp 35.9 C 12/29/20 1311  Pulse 45 12/29/20 1314  Resp 19 12/29/20 1314  SpO2 100 % 12/29/20 1314  Vitals shown include unvalidated device data.  Last Pain:  Vitals:   12/29/20 0921  TempSrc: Oral  PainSc: 0-No pain         Complications: No complications documented.

## 2020-12-29 NOTE — Anesthesia Procedure Notes (Signed)
Procedure Name: Intubation Date/Time: 12/29/2020 11:04 AM Performed by: Natasha Mead, CRNA Pre-anesthesia Checklist: Patient identified, Emergency Drugs available, Suction available and Patient being monitored Patient Re-evaluated:Patient Re-evaluated prior to induction Oxygen Delivery Method: Circle system utilized Preoxygenation: Pre-oxygenation with 100% oxygen Induction Type: IV induction Ventilation: Mask ventilation without difficulty Laryngoscope Size: 4 and McGraph Grade View: Grade I Tube type: Oral Tube size: 7.0 mm Number of attempts: 1 Airway Equipment and Method: Stylet and Oral airway Placement Confirmation: ETT inserted through vocal cords under direct vision,  positive ETCO2 and breath sounds checked- equal and bilateral Secured at: 21 cm Tube secured with: Tape Dental Injury: Teeth and Oropharynx as per pre-operative assessment

## 2020-12-29 NOTE — Interval H&P Note (Signed)
History and Physical Interval Note:  12/29/2020 10:39 AM  David Myers  has presented today for surgery, with the diagnosis of left ureteral, renal calculi.  The various methods of treatment have been discussed with the patient and family. After consideration of risks, benefits and other options for treatment, the patient has consented to  Procedure(s): CYSTOSCOPY/URETEROSCOPY/HOLMIUM LASER/STENT PLACEMENT (Left) as a surgical intervention.  The patient's history has been reviewed, patient examined, no change in status, stable for surgery.  I have reviewed the patient's chart and labs.  Questions were answered to the patient's satisfaction.     [place]

## 2020-12-29 NOTE — Anesthesia Preprocedure Evaluation (Signed)
Anesthesia Evaluation  Patient identified by MRN, date of birth, ID band Patient awake    Reviewed: Allergy & Precautions, H&P , NPO status , Patient's Chart, lab work & pertinent test results  History of Anesthesia Complications Negative for: history of anesthetic complications  Airway Mallampati: III  TM Distance: >3 FB Neck ROM: limited    Dental  (+) Chipped   Pulmonary neg shortness of breath, sleep apnea ,    Pulmonary exam normal        Cardiovascular Exercise Tolerance: Good hypertension, (-) angina+ Peripheral Vascular Disease  Normal cardiovascular exam     Neuro/Psych PSYCHIATRIC DISORDERS Dementia TIA Neuromuscular disease CVA    GI/Hepatic Neg liver ROS, hiatal hernia, GERD  Medicated and Controlled,  Endo/Other  diabetes, Type 2  Renal/GU Renal disease     Musculoskeletal  (+) Arthritis ,   Abdominal   Peds  Hematology negative hematology ROS (+)   Anesthesia Other Findings Past Medical History: No date: Arthritis No date: Diabetes mellitus without complication (HCC)     Comment:  diet controlled No date: GERD (gastroesophageal reflux disease) No date: History of hiatal hernia No date: History of kidney stones No date: Hypertension No date: Sleep apnea     Comment:  does not uses CPAP machine for many years  No date: Stroke [facility])     Comment:  2018  Past Surgical History: 08/12/2015: CATARACT EXTRACTION W/PHACO; Left     Comment:  Procedure: CATARACT EXTRACTION PHACO AND INTRAOCULAR               LENS PLACEMENT (IOC);  Surgeon: Leandrew Koyanagi, MD;               Location: [place];  Service: Ophthalmology;                Laterality: Left;  DIABETIC - diet controlled TORIC 09/09/2015: CATARACT EXTRACTION W/PHACO; Right     Comment:  Procedure: CATARACT EXTRACTION PHACO AND INTRAOCULAR               LENS PLACEMENT (IOC);  Surgeon: Leandrew Koyanagi, MD;                Location: [place];  Service: Ophthalmology;                Laterality: Right;  DIABETIC - diet controlled No date: COLONOSCOPY No date: EYE SURGERY 11/17/2020: TRANSURETHRAL RESECTION OF PROSTATE; N/A     Comment:  Procedure: TRANSURETHRAL RESECTION OF THE PROSTATE               (TURP);  Surgeon: Abbie Sons, MD;  Location: [facility]               ORS;  Service: Urology;  Laterality: N/A; No date: WRIST SURGERY     Reproductive/Obstetrics negative OB ROS                             Anesthesia Physical  Anesthesia Plan  ASA: IV and emergent  Anesthesia Plan: General ETT   Post-op Pain Management:    Induction: Intravenous  PONV Risk Score and Plan: Ondansetron, Dexamethasone, Midazolam and Treatment may vary due to age or medical condition  Airway Management Planned: Oral ETT  Additional Equipment:   Intra-op Plan:   Post-operative Plan: Extubation in OR  Informed Consent: I have reviewed the patients History and Physical, chart, labs and discussed the procedure including the risks, benefits  and alternatives for the proposed anesthesia with the patient or authorized representative who has indicated his/her understanding and acceptance.     Dental Advisory Given  Plan Discussed with: Anesthesiologist, CRNA and Surgeon  Anesthesia Plan Comments: (Patient and wife consented for risks of anesthesia including but not limited to:  - adverse reactions to medications - damage to eyes, teeth, lips or other oral mucosa - nerve damage due to positioning  - sore throat or hoarseness - Damage to heart, brain, nerves, lungs, other parts of body or loss of life  They voiced understanding.)        Anesthesia Quick Evaluation

## 2020-12-29 NOTE — Progress Notes (Signed)
[facility] PROGRESS NOTE   David Myers AJG:[id number] DOB: May 08, 1932 DOA: 12/16/2020  PCP: Rusty Aus, MD  Brief History/Interval Summary: 87 y.o.malewho is status post TURP in 04/2022by Dr Bernardo Heater. Other comorbidities include diabetes mellitus, kidney stones, history of stroke,obstructive sleep apnea not compliant with CPAP. As per wife, post TURP, patient has been having recurrent UTI/prostatitis and has been on and off antibiotics for several weeks. He was last on levofloxacin for treatment of recurrent UTI but continued to have persistent fever with associated lower abdominal discomfort. Patient was hospitalized after he was seen at urology office.  CT scan was done which showed new left hydronephrosis with obstructive nephrolithiasis.  Patient underwent cystoscopy and stent placement on 5/18.  Urine culture positive for Stenotrophomonas maltophilia with multiple drug resistance.  ID was consulted.  Consultants: Infectious disease.  Urology.  Procedures:   Cystoscopy and ureteral stent placement on 5/18  Lithotripsy and ureteral stent exchange planned for today, 5/31   Antibiotics: Anti-infectives (From admission, onward)   Start     Dose/Rate Route Frequency Ordered Stop   12/22/20 1200  [MAR Hold]  aztreonam (AZACTAM) 2 g in sodium chloride 0.9 % 100 mL IVPB        (MAR Hold since Tue 12/29/2020 at 0910.Hold Reason: Transfer to a Procedural area.)   2 g 33.3 mL/hr over 180 Minutes Intravenous Every 8 hours 12/22/20 0852     12/22/20 1200  [MAR Hold]  ceftazidime-avibactam (AVYCAZ) 1.25 g in dextrose 5 % 50 mL IVPB        (MAR Hold since Tue 12/29/2020 at 0910.Hold Reason: Transfer to a Procedural area.)   1.25 g 16.67 mL/hr over 3 Hours Intravenous Every 8 hours 12/22/20 0852     12/19/20 1230  ceftazidime-avibactam (AVYCAZ) 0.94 g in dextrose 5 % 50 mL IVPB  Status:  Discontinued        0.94 g 16.67 mL/hr over 3 Hours Intravenous Every 12 hours 12/19/20  1132 12/22/20 0852   12/19/20 1230  aztreonam (AZACTAM) 2 g in sodium chloride 0.9 % 100 mL IVPB  Status:  Discontinued        2 g 33.3 mL/hr over 180 Minutes Intravenous Every 12 hours 12/19/20 1132 12/22/20 0852   12/18/20 2245  levofloxacin (LEVAQUIN) IVPB 500 mg  Status:  Discontinued       "Followed by" Linked Group Details   500 mg 100 mL/hr over 60 Minutes Intravenous Every 48 hours 12/16/20 2147 12/19/20 1123   12/17/20 0000  cefTRIAXone (ROCEPHIN) 2 g in sodium chloride 0.9 % 100 mL IVPB  Status:  Discontinued        2 g 200 mL/hr over 30 Minutes Intravenous Every 24 hours 12/16/20 2110 12/16/20 2112   12/16/20 2245  levofloxacin (LEVAQUIN) IVPB 750 mg       "Followed by" Linked Group Details   750 mg 100 mL/hr over 90 Minutes Intravenous  Once 12/16/20 2147 12/16/20 2359   12/16/20 2115  levofloxacin (LEVAQUIN) IVPB 500 mg  Status:  Discontinued        500 mg 100 mL/hr over 60 Minutes Intravenous Every 24 hours 12/16/20 2112 12/16/20 2147   12/16/20 1745  cefTRIAXone (ROCEPHIN) 1 g in sodium chloride 0.9 % 100 mL IVPB        1 g 200 mL/hr over 30 Minutes Intravenous  Once 12/16/20 1742 12/16/20 1836      Subjective/Interval History: Patient had an uneventful night.  Looking forward with his procedure this  morning.  His wife is at the bedside.     Assessment/Plan:  Acute left pyelonephritis with multidrug-resistant stenotrophomonas infection Patient was seen by infectious disease and urology.  Patient remains on aztreonam and ceftazidime/avibactam.  Will need to stay in the hospital while he is getting these antibiotics as there are no outpatient treatment options available at this time. Seen by ID yesterday.  They repeated a urine culture.  They are planning antibiotics for 24 to 48 hours after his procedure today.  Will defer to them.  Nephrolithiasis with obstruction Status post cystoscopy and ureteral stent placement ON 5/18.  Urology has been following.   Plan is  for left ureteroscopy with laser lithotripsy and ureteral stent exchange today.  Acute kidney injury on chronic kidney disease stage IV Baseline creatinine around 1.2.  Presented with creatinine of 2.71.   Patient renal function started improving after urological intervention and IV fluids.   Renal function now at baseline.  Essential hypertension We had to hold patient's antihypertensives due to borderline low blood pressure.  Blood pressure is low but stable.  He is asymptomatic.  Normocytic anemia Hemoglobin stable.  Continue to monitor.   DVT Prophylaxis: Subcutaneous heparin Code Status: Full code Family Communication: Discussed with patient and his wife Disposition Plan: Home hopefully in 24 to 48 hours depending on plans per infectious disease.  Status is: Inpatient  Remains inpatient appropriate because:IV treatments appropriate due to intensity of illness or inability to take PO   Dispo: The patient is from: Home              Anticipated d/c is to: Home              Patient currently is not medically stable to d/c.   Difficult to place patient No      Medications:  Scheduled: . [MAR Hold] acidophilus  1 capsule Oral Daily  . [MAR Hold] aspirin EC  81 mg Oral Daily  . [MAR Hold] Chlorhexidine Gluconate Cloth  6 each Topical Daily  . [MAR Hold] cholecalciferol  1,000 Units Oral Daily  . [MAR Hold] docusate sodium  100 mg Oral BID  . [MAR Hold] famotidine  20 mg Oral QHS  . [MAR Hold] heparin  5,000 Units Subcutaneous Q8H  . [MAR Hold] insulin aspart  0-6 Units Subcutaneous TID WC  . [MAR Hold] Netarsudil-Latanoprost  1 drop Ophthalmic Daily  . [MAR Hold] pantoprazole  40 mg Oral QAC breakfast  . [MAR Hold] simvastatin  20 mg Oral QHS  . [MAR Hold] tamsulosin  0.4 mg Oral Daily   Continuous: . [MAR Hold] sodium chloride 250 mL (12/26/20 2320)  . [MAR Hold] aztreonam 2 g (12/29/20 0522)  . [MAR Hold] ceftazidime avibactam (AVYCAZ) IVPB 1.25 g (12/29/20 0523)    PRN:[MAR Hold] sodium chloride, [MAR Hold] acetaminophen **OR** [MAR Hold] acetaminophen, [MAR Hold] bisacodyl, [MAR Hold] ondansetron **OR** [MAR Hold] ondansetron (ZOFRAN) IV   Objective:  Vital Signs  Vitals:   12/29/20 0042 12/29/20 0412 12/29/20 0759 12/29/20 0921  BP: 103/74 100/64 118/62 106/71  Pulse: 72 70 80 (!) 54  Resp: 17 17 18 18   Temp: (!) 97.4 F (36.3 C) 97.8 F (36.6 C) 97.7 F (36.5 C) 98 F (36.7 C)  TempSrc: Oral Oral Oral Oral  SpO2: 95% 95% 93% 98%  Weight:    72.4 kg  Height:    5\' 7"  (1.702 m)    Intake/Output Summary (Last 24 hours) at 12/29/2020 1017 Last data filed at 12/29/2020  1914 Gross per 24 hour  Intake --  Output 975 ml  Net -975 ml   Filed Weights   12/16/20 2200 12/29/20 0921  Weight: 72.4 kg 72.4 kg    General appearance: Awake alert.  In no distress Resp: Clear to auscultation bilaterally.  Normal effort Cardio: S1-S2 is normal regular.  No S3-S4.  No rubs murmurs or bruit GI: Abdomen is soft.  Nontender nondistended.  Bowel sounds are present normal.  No masses organomegaly Extremities: No edema.  Full range of motion of lower extremities. Neurologic: No focal neurological deficits.     Lab Results:  Data Reviewed: I have personally reviewed following labs and imaging studies  CBC: Recent Labs  Lab 12/24/20 0411 12/29/20 0550  WBC 8.4 7.1  HGB 11.3* 10.8*  HCT 32.9* 31.3*  MCV 92.9 90.7  PLT 284 782    Basic Metabolic Panel: Recent Labs  Lab 12/24/20 0411 12/25/20 0453 12/26/20 0819 12/27/20 0507 12/28/20 0542 12/29/20 0550  NA 138 136  --   --   --  139  K 5.1 4.3  --   --   --  3.7  CL 106 108  --   --   --  110  CO2 24 21*  --   --   --  20*  GLUCOSE 118* 122*  --   --   --  96  BUN 47* 42*  --   --   --  37*  CREATININE 1.43* 1.37* 1.31* 1.15 1.15 1.26*  CALCIUM 9.0 8.6*  --   --   --  8.8*    GFR: Estimated Creatinine Clearance: 38.6 mL/min (A) (by C-G formula based on SCr of 1.26 mg/dL  (H)).   CBG: Recent Labs  Lab 12/28/20 0822 12/28/20 1205 12/28/20 1641 12/28/20 2218 12/29/20 0801  GLUCAP 117* 134* 143* 128* 121*     No results found for this or any previous visit (from the past 240 hour(s)).    Radiology Studies: No results found.     LOS: 13 days   Shanielle Correll [employer] on [email or web address]  12/29/2020, 10:17 AM

## 2020-12-29 NOTE — Op Note (Signed)
Preoperative diagnosis:  1. Left distal ureteral calculi 2. Left nephrolithiasis  Postoperative diagnosis:  1.  Same  Procedure:  1. Cystoscopy 2. Left ureteroscopy and stone removal 3. Ureteroscopic laser lithotripsy 4. Left ureteral stent exchange (6FR/24 cm) 5. Left retrograde pyelography with interpretation  Surgeon: Nicki Reaper C. Nilton Lave, M.D.  Anesthesia: General  Complications: None  Intraoperative findings:  1. Cystoscopy-normal caliber ureter without stricture; healing, resected prostatic fossa.  Heavily trabeculated bladder with cellules, diverticula.  Inflammatory changes left UO secondary to indwelling stent 2. Ureteropyeloscopy-8 and 5 mm calculi distal ureter; dense 12 mm lower calyceal left renal calculus 3. Left retrograde pyelography post procedure showed no filling defects, stone fragments or contrast extravasation   EBL: Minimal  Specimens: 1. Calculus fragments for analysis   Indication: David Myers is a 87 y.o. male with BPH April 2022.  Postoperatively he had intermittent fever and a urine culture positive for stenotrophomonas maltophilia which was initially Levaquin sensitive.  He completed a course of Levaquin with improvement however had recurrent symptoms.  A CT was obtained which showed left hydronephrosis secondary to an 8 mm left distal ureteral calculus.  He underwent urgent stent placement and urine obtained from the left renal pelvis grew multidrug-resistant stenotrophomonas maltophilia.  He has remained in the hospital for IV antibiotic therapy and presents for definitive stone treatment.  After reviewing the management options for treatment, the patient elected to proceed with the above surgical procedure(s). We have discussed the potential benefits and risks of the procedure, side effects of the proposed treatment, the likelihood of the patient achieving the goals of the procedure, and any potential problems that might occur during the  procedure or recuperation. Informed consent has been obtained.  Description of procedure:  The patient was taken to the operating room and general anesthesia was induced.  The patient was placed in the dorsal lithotomy position, prepped and draped in the usual sterile fashion, and preoperative antibiotics were administered. A preoperative time-out was performed.   A 21 French cystoscope was lubricated, passed per urethra and advanced proximally under direct vision with findings as described above.  The left ureteral stent was grasped with endoscopic forceps and brought out to the urethral meatus.  A 0.038 Sensor wire was placed through the stent and into the renal pelvis under fluoroscopic guidance.  The indwelling stent was removed.  A 4.5 French semirigid ureteroscope was then passed per urethra and easily advanced into the left ureteral orifice where the 8 mm calculus was identified.  There was mild mucosal edema distal to the stone.  The stone was fragmented at a setting of 0.2 J / 20 Hz and the fragments were removed with a 1.9 [place] nitinol basket.  The 4 mm calculus was treated and removed in a similar fashion.  The ureteroscope was replaced and a second Sensor wire was placed through the ureteroscope into the renal pelvis.  The ureteroscope was removed and a 12/14 French ureteral access sheath was placed over the working wire under fluoroscopic guidance without difficulty.  A single channel digital flexible ureteroscope was then placed through the access sheath into the renal pelvis.  Pyeloscopy was performed with findings as described above.  The calculus was then dusted with a 242 m laser fiber at bipedal settings of 0.3 J / 60 Hz and 0.2 J / 40 Hz.  During dusting as the stone became smaller a few larger fragments were obtained and settled in a lower pole calyx.  These were further treated with  noncontact laser lithotripsy at settings of 0.6J/50 Hz.  All calyces were examined.  A few  fragments were removed from the lower pole calyx however were multiple fragments within the basket that were <1 mm in size.  The ureteroscope was repassed and retrograde pyelogram was performed with findings as described above.  All calyces were examined under fluoroscopic guidance and no additional fragments estimated at >1 mm in size were identified.  The ureteroscope and access sheath were removed in tandem and no mucosal abnormalities of the ureter were noted.  A 6FR/24 cm Contour ureteral stent was then placed on fluoroscopic guidance with good positioning noted proximally and distally.  The bladder was emptied with a cystoscope sheath.   The patient appeared to tolerate the procedure well and without complications.  After anesthetic reversal the patient was transported to the PACU in stable condition.    Plan:  Office follow-up 1 week for cystoscopy with stent removal   [name], MD

## 2020-12-30 ENCOUNTER — Other Ambulatory Visit: Payer: Self-pay

## 2020-12-30 DIAGNOSIS — N179 Acute kidney failure, unspecified: Secondary | ICD-10-CM | POA: Diagnosis not present

## 2020-12-30 DIAGNOSIS — I10 Essential (primary) hypertension: Secondary | ICD-10-CM

## 2020-12-30 DIAGNOSIS — Z1624 Resistance to multiple antibiotics: Secondary | ICD-10-CM | POA: Diagnosis not present

## 2020-12-30 DIAGNOSIS — N390 Urinary tract infection, site not specified: Secondary | ICD-10-CM | POA: Diagnosis not present

## 2020-12-30 DIAGNOSIS — N200 Calculus of kidney: Secondary | ICD-10-CM | POA: Diagnosis not present

## 2020-12-30 LAB — CBC
HCT: 31.2 % — ABNORMAL LOW (ref 39.0–52.0)
Hemoglobin: 10.7 g/dL — ABNORMAL LOW (ref 13.0–17.0)
MCH: 31.9 pg (ref 26.0–34.0)
MCHC: 34.3 g/dL (ref 30.0–36.0)
MCV: 93.1 fL (ref 80.0–100.0)
Platelets: 217 10*3/uL (ref 150–400)
RBC: 3.35 MIL/uL — ABNORMAL LOW (ref 4.22–5.81)
RDW: 14.1 % (ref 11.5–15.5)
WBC: 8.2 10*3/uL (ref 4.0–10.5)
nRBC: 0 % (ref 0.0–0.2)

## 2020-12-30 LAB — BASIC METABOLIC PANEL
Anion gap: 8 (ref 5–15)
BUN: 33 mg/dL — ABNORMAL HIGH (ref 8–23)
CO2: 22 mmol/L (ref 22–32)
Calcium: 8.7 mg/dL — ABNORMAL LOW (ref 8.9–10.3)
Chloride: 107 mmol/L (ref 98–111)
Creatinine, Ser: 1.23 mg/dL (ref 0.61–1.24)
GFR, Estimated: 57 mL/min — ABNORMAL LOW (ref >60)
Glucose, Bld: 106 mg/dL — ABNORMAL HIGH (ref 70–99)
Potassium: 4.6 mmol/L (ref 3.5–5.1)
Sodium: 137 mmol/L (ref 135–145)

## 2020-12-30 LAB — URINE CULTURE: Culture: NO GROWTH

## 2020-12-30 LAB — GLUCOSE, CAPILLARY
Glucose-Capillary: 131 mg/dL — ABNORMAL HIGH (ref 70–99)
Glucose-Capillary: 151 mg/dL — ABNORMAL HIGH (ref 70–99)

## 2020-12-30 MED ORDER — TAMSULOSIN HCL 0.4 MG PO CAPS: 0.4000 mg | ORAL_CAPSULE | Freq: Every day | ORAL | 0 refills | Status: DC

## 2020-12-30 NOTE — Anesthesia Postprocedure Evaluation (Signed)
Anesthesia Post Note  Patient: David Myers  Procedure(s) Performed: CYSTOSCOPY/URETEROSCOPY/HOLMIUM LASER/STENT PLACEMENT (Left )  Patient location during evaluation: PACU Anesthesia Type: General Level of consciousness: awake and alert Pain management: pain level controlled Vital Signs Assessment: post-procedure vital signs reviewed and stable Respiratory status: spontaneous breathing, nonlabored ventilation, respiratory function stable and patient connected to nasal cannula oxygen Cardiovascular status: blood pressure returned to baseline and stable Postop Assessment: no apparent nausea or vomiting Anesthetic complications: no   No complications documented.   Last Vitals:  Vitals:   12/30/20 0831 12/30/20 1128  BP: 110/72 102/62  Pulse: (!) 105 70  Resp: 17 17  Temp: 36.8 C 36.6 C  SpO2: 92% 93%    Last Pain:  Vitals:   12/30/20 1128  TempSrc: Oral  PainSc:                  [name]

## 2020-12-30 NOTE — Progress Notes (Signed)
Date of Admission:  12/16/2020      ID: David Myers is a 87 y.o. maleActive Problems:   Kidney stone   Pyelonephritis   Acute cystitis with hematuria   AKI (acute kidney injury) ([place])   Underwent cystoscopy, left ureteroscopy and stone removal, ureteroscopy laser lithotripsy and left ureteral stent exchange yesterday. Subjective: Doing better No dysuria No flank pain No fever  Medications:  . acidophilus  1 capsule Oral Daily  . aspirin EC  81 mg Oral Daily  . Chlorhexidine Gluconate Cloth  6 each Topical Daily  . cholecalciferol  1,000 Units Oral Daily  . docusate sodium  100 mg Oral BID  . famotidine  20 mg Oral QHS  . heparin  5,000 Units Subcutaneous Q8H  . insulin aspart  0-6 Units Subcutaneous TID WC  . Netarsudil-Latanoprost  1 drop Ophthalmic Daily  . pantoprazole  40 mg Oral QAC breakfast  . simvastatin  20 mg Oral QHS  . tamsulosin  0.4 mg Oral Daily    Objective: Vital signs in last 24 hours: Patient Vitals for the past 24 hrs:  BP Temp Temp src Pulse Resp SpO2  12/30/20 1128 102/62 97.9 F (36.6 C) Oral 70 17 93 %  12/30/20 0831 110/72 98.3 F (36.8 C) -- (!) 105 17 92 %  12/30/20 0517 103/67 98 F (36.7 C) Oral 61 20 93 %  12/30/20 0043 115/80 98.1 F (36.7 C) Oral 71 16 96 %  12/29/20 2004 95/61 98.1 F (36.7 C) Oral 64 16 93 %  12/29/20 1605 (!) 100/37 97.6 F (36.4 C) -- (!) 53 20 94 %  12/29/20 1419 (!) 99/50 98.9 F (37.2 C) -- (!) 49 16 100 %  12/29/20 1341 (!) 108/59 (!) 97.1 F (36.2 C) -- 67 (!) 21 93 %  12/29/20 1330 107/60 -- -- 83 17 92 %    PHYSICAL EXAM:  General: Alert, cooperative, no distress, appears stated age.  Head: Normocephalic, without obvious abnormality, atraumatic. Eyes: rt black eye ENT Nares normal. No drainage or sinus tenderness. Lips, mucosa, and tongue normal. No Thrush Neck: Supple, symmetrical, no adenopathy, thyroid: non tender no carotid bruit and no JVD. Back: No CVA tenderness. Lungs:  Clear to auscultation bilaterally. No Wheezing or Rhonchi. No rales. Heart: Regular rate and rhythm, no murmur, rub or gallop. Abdomen: Soft, non-tender,not distended. Bowel sounds normal. No masses Extremities: atraumatic, no cyanosis. No edema. No clubbing Skin: No rashes or lesions. Or bruising Lymph: Cervical, supraclavicular normal. Neurologic: Grossly non-focal  Lab Results Recent Labs    12/29/20 0550 12/30/20 0529  WBC 7.1 8.2  HGB 10.8* 10.7*  HCT 31.3* 31.2*  NA 139 137  K 3.7 4.6  CL 110 107  CO2 20* 22  BUN 37* 33*  CREATININE 1.26* 1.23   Liver Panel No results for input(s): PROT, ALBUMIN, AST, ALT, ALKPHOS, BILITOT, BILIDIR, IBILI in the last 72 hours. Sedimentation Rate No results for input(s): ESRSEDRATE in the last 72 hours. C-Reactive Protein No results for input(s): CRP in the last 72 hours.  Microbiology: 12/28/2020 urine culture no growth    Assessment/Plan: Complicated urinary tract infection secondary to multidrug-resistant stenotrophomonas.  Recent TURP followed by UTI. Left hydronephrosis secondary to ureteric calculi in the distal left ureter.  Status post laser lithotripsy and stone removal and stent exchange. Repeat urine culture from 12/28/2020 is negative.  Patient is on day 14 of IV aztreonam and IV Avycaz. He has no fever and no white count.  As repeat culture is negative he will not need any antibiotics a on discharge. Discussed the management with patient ad his wife

## 2020-12-30 NOTE — Discharge Summary (Signed)
Physician Discharge Summary  David Myers BDZ:[id number] DOB: September 06, 1931 DOA: 12/16/2020  PCP: David Aus, MD  Admit date: 12/16/2020 Discharge date: 12/30/2020  Admitted From: home Disposition: home   Recommendations for Outpatient Follow-up:  1. Follow up with PCP in 1-2 weeks 2. F/u w/ urology, Dr. Bernardo Myers in 7-10 days for cystoscopy stent removal w/ KUB prior  Home Health: no  Equipment/Devices:   Discharge Condition: stable  CODE STATUS: full Diet recommendation: Heart Healthy   Brief/Interim Summary: HPI was taken from Dr. Phineas Myers: Patient is a poor historian at this time due to his condition.  He is still under effects of anesthesia hence could not give me any significant history.  Most of the history was obtained from wife.  David Myers is an 87 y.o. male who is status post TURP in 10/2020 by Dr David Myers.  Other comorbidities include diabetes mellitus, kidney stones, history of stroke,obstructive sleep apnea not compliant with CPAP.  As per wife, post TURP, patient has been having recurrent UTI/prostatitis and has been on and off antibiotics for several weeks.  He was last on levofloxacin for treatment of recurrent UTI but continued to have persistent fever with associated lower abdominal discomfort.  He went back to see urology today, urinalysis was obtained in the office and was suggestive of UTI.  Stat CT scan was requested by urology and result came back suggesting new left hydronephrosis with obstructive nephrolithiasis.  Urology recommended patient to be admitted for urgent cystoscopy and stent placement.  Procedure was done this evening.  No perioperative or postop complications reported.  Patient was referred to hospitalist service for admission and further management.  Review of records, patient gram-negative rod UTI on 04/2 8.  Cultures isolated Stenotrophomonas maltophilia which was multidrug-resistant with sensitive to only to levofloxacin.  Repeat  cultures on 05/10 shows persistent gram-negative UTI  As per Dr. [place] Myers: 87 y.o.malewho is status post TURP in 04/2022by Dr David Myers. Other comorbidities include diabetes mellitus, kidney stones, history of stroke,obstructive sleep apnea not compliant with CPAP. As per wife, post TURP, patient has been having recurrent UTI/prostatitis and has been on and off antibiotics for several weeks. He was last on levofloxacin for treatment of recurrent UTI but continued to have persistent fever with associated lower abdominal discomfort. Patient was hospitalized after he was seen at urology office.  CT scan was done which showed new left hydronephrosis with obstructive nephrolithiasis.  Patient underwent cystoscopy and stent placement on 5/18.  Urine culture positive for Stenotrophomonas maltophilia with multiple drug resistance.  ID was consulted.   Hospital course from Dr. Jimmye Myers 12/30/20: Pt's repeat urine culture on 12/29/20 shows no growth. Abxs were d/c as per by ID. PT evaluated the pt but pt does not any PT f/u.   Discharge Diagnoses:  Active Problems:   Kidney stone   Pyelonephritis   Acute cystitis with hematuria   AKI (acute kidney injury) ([place])  Acute left pyelonephritis: with multidrug-resistant stenotrophomonas infection. Repeat urine cx on 12/29/20 shows no growth. Abxs were d/c as per ID  Nephrolithiasis: with obstruction. S/p cystoscopy and ureteral stent placement on 5/18. S/p cystoscopy, left ureteroscopy & stone removal, ureteroscopic laser lithotripsy, left ureteral stent exchange on 12/29/20 as per uro. Will go for cystoscopy stent removal in 7-10 days w/ prior KUB as per urology   AKI on CKDIV: Cr continues to trend down. Avoid nephrotoxic meds   HTN: continue to hold anti-HTN meds as BP is low normal   Normocytic anemia: H&H  are stable. No need for a transfusion currently    Discharge Instructions  Discharge Instructions    Diet - low sodium heart healthy    Complete by: As directed    Discharge instructions   Complete by: As directed    F/u w/ PCP in 1-2 weeks. F/u w/ urology, Dr. Bernardo Myers, in 7-10 days for outpatient cystoscopy stent removal w/ prior KUB   Increase activity slowly   Complete by: As directed      Allergies as of 12/30/2020      Reactions   Augmentin [amoxicillin-pot Clavulanate] Diarrhea   Led to C-diff       Medication List    STOP taking these medications   levofloxacin 500 MG tablet Commonly known as: LEVAQUIN     TAKE these medications   aspirin EC 81 MG tablet Take 81 mg by mouth daily. Notes to patient: Last dose given 12/30/2020 at 09:39am   docusate sodium 100 MG capsule Commonly known as: Colace Take 1 capsule (100 mg total) by mouth 2 (two) times daily. Notes to patient: Last dose given 12/30/2020 at 09:39am   famotidine 40 MG tablet Commonly known as: PEPCID Take 40 mg by mouth at bedtime. Notes to patient: Last dose given 12/29/2020 at 8:58pm   HYDROcodone-acetaminophen 5-325 MG tablet Commonly known as: NORCO/VICODIN Take 1-2 tablets by mouth every 6 (six) hours as needed for moderate pain. Notes to patient: Not given while in hospital.    losartan-hydrochlorothiazide 50-12.5 MG tablet Commonly known as: HYZAAR Take 0.5 tablets by mouth daily. Notes to patient: Not given while in hospital.    montelukast 5 MG chewable tablet Commonly known as: SINGULAIR Chew by mouth. Notes to patient: Not given while in hospital.    pantoprazole 40 MG tablet Commonly known as: PROTONIX Take 40 mg by mouth daily. Notes to patient: Last dose given 12/30/2020 at 09:39am   Rocklatan 0.02-0.005 % Soln Generic drug: Netarsudil-Latanoprost Apply 1 drop to eye daily. Notes to patient: Last dose given 12/29/2020 at 8:57pm   simvastatin 20 MG tablet Commonly known as: ZOCOR Take 20 mg by mouth at bedtime. Notes to patient: Last dose given 12/29/2020 at 8:58pm   tamsulosin 0.4 MG Caps capsule Commonly  known as: FLOMAX Take 1 capsule (0.4 mg total) by mouth daily. Start taking on: December 31, 2020 Notes to patient: Last dose given 12/30/2020 at 09:39am   Vitamin D3 25 MCG (1000 UT) Caps Take 1,000 Units by mouth daily. Notes to patient: Last dose given 12/30/2020 a t09:39am       Follow-up Information    Stoioff, Ronda Fairly, MD In 1 week.   Specialty: Urology Why: For cysto stent removal with KUB prior Contact information: [place] [address] [phone]              Allergies  Allergen Reactions  . Augmentin [Amoxicillin-Pot Clavulanate] Diarrhea    Led to C-diff     Consultations:  Urology: Dr. Bernardo Myers   ID: Dr. Delaine Lame    Procedures/Studies: DG OR UROLOGY CYSTO IMAGE ([place])  Result Date: 12/29/2020 There is no interpretation for this exam.  This order is for images obtained during a surgical procedure.  Please See "Surgeries" Tab for more information regarding the procedure.   DG OR UROLOGY CYSTO IMAGE ([facility] ONLY)  Result Date: 12/16/2020 There is no interpretation for this exam.  This order is for images obtained during a surgical procedure.  Please See "Surgeries" Tab for more  information regarding the procedure.   CT RENAL STONE STUDY  Result Date: 12/16/2020 CLINICAL DATA:  Left flank pain. Hematuria. Fever and dysuria. Recent TURP procedure. EXAM: CT ABDOMEN AND PELVIS WITHOUT CONTRAST TECHNIQUE: Multidetector CT imaging of the abdomen and pelvis was performed following the standard protocol without IV contrast. COMPARISON:  02/12/2014 FINDINGS: Lower chest: No acute findings. Chronic interstitial lung disease noted in both lung bases. Hepatobiliary: No mass visualized on this unenhanced exam. Gallbladder is unremarkable. No evidence of biliary ductal dilatation. Pancreas: No mass or inflammatory process visualized on this unenhanced exam. Spleen:  Within normal limits in size. Adrenals/Urinary tract: A few renal calculi  are seen bilaterally, largest in lower pole of left kidney measuring 10 mm. New moderate left hydroureteronephrosis is seen, due to 2 adjacent calculi in the distal left ureter measuring 7 mm and 4 mm. Diffuse bladder wall thickening and trabeculation again seen, consistent with chronic bladder outlet obstruction. Stomach/Bowel: Increased size of moderate hiatal hernia noted. No evidence of obstruction, inflammatory process, or abnormal fluid collections. Normal appendix visualized. Diverticulosis is seen mainly involving the sigmoid colon, however there is no evidence of diverticulitis. Vascular/Lymphatic: No pathologically enlarged lymph nodes identified. No evidence of abdominal aortic aneurysm. Aortic atherosclerotic calcification noted. Reproductive: Mildly enlarged prostate gland again noted TURP defect which is new since previous study. Other:  Stable small left inguinal hernia containing only fat. Musculoskeletal:  No suspicious bone lesions identified. IMPRESSION: New moderate left hydroureteronephrosis due to 2 adjacent calculi in distal left ureter measuring 7 mm and 4 mm. Bilateral nephrolithiasis. Mildly enlarged prostate gland and findings of chronic bladder outlet obstruction. New TURP defect noted. Increased size of moderate hiatal hernia. Colonic diverticulosis. No radiographic evidence of diverticulitis. Aortic Atherosclerosis (ICD10-I70.0). Electronically Signed   By: Marlaine Hind M.D.   On: 12/16/2020 16:21   (Echo, Carotid, EGD, Colonoscopy, ERCP)    Subjective:   Discharge Exam: Vitals:   12/30/20 0831 12/30/20 1128  BP: 110/72 102/62  Pulse: (!) 105 70  Resp: 17 17  Temp: 98.3 F (36.8 C) 97.9 F (36.6 C)  SpO2: 92% 93%   Vitals:   12/30/20 0043 12/30/20 0517 12/30/20 0831 12/30/20 1128  BP: 115/80 103/67 110/72 102/62  Pulse: 71 61 (!) 105 70  Resp: 16 20 17 17   Temp: 98.1 F (36.7 C) 98 F (36.7 C) 98.3 F (36.8 C) 97.9 F (36.6 C)  TempSrc: Oral Oral  Oral   SpO2: 96% 93% 92% 93%  Weight:      Height:        General: Pt is alert, awake, not in acute distress Cardiovascular:  S1/S2 +, no rubs, no gallops Respiratory: CTA bilaterally, no wheezing, no rhonchi Abdominal: Soft, NT, ND, bowel sounds + Extremities: no edema, no cyanosis    The results of significant diagnostics from this hospitalization (including imaging, microbiology, ancillary and laboratory) are listed below for reference.     Microbiology: Recent Results (from the past 240 hour(s))  Urine Culture     Status: None   Collection Time: 12/28/20  6:12 PM   Specimen: Urine, Random  Result Value Ref Range Status   Specimen Description   Final    URINE, RANDOM Performed at [facility], [address], [place], [place] [address]    Special Requests   Final    NONE Performed at [facility], [address], [place], [place] [address]    Culture   Final    NO GROWTH Performed at  [facility], [place] [address] [place], [place] [address]    Report Status 12/30/2020 FINAL  Final     Labs: BNP (last 3 results) No results for input(s): BNP in the last 8760 hours. Basic Metabolic Panel: Recent Labs  Lab 12/24/20 0411 12/25/20 0453 12/26/20 0819 12/27/20 0507 12/28/20 0542 12/29/20 0550 12/30/20 0529  NA 138 136  --   --   --  139 137  K 5.1 4.3  --   --   --  3.7 4.6  CL 106 108  --   --   --  110 107  CO2 24 21*  --   --   --  20* 22  GLUCOSE 118* 122*  --   --   --  96 106*  BUN 47* 42*  --   --   --  37* 33*  CREATININE 1.43* 1.37* 1.31* 1.15 1.15 1.26* 1.23  CALCIUM 9.0 8.6*  --   --   --  8.8* 8.7*   Liver Function Tests: No results for input(s): AST, ALT, ALKPHOS, BILITOT, PROT, ALBUMIN in the last 168 hours. No results for input(s): LIPASE, AMYLASE in the last 168 hours. No results for input(s): AMMONIA in the last 168 hours. CBC: Recent Labs  Lab 12/24/20 0411 12/29/20 0550 12/30/20 0529  WBC 8.4 7.1 8.2  HGB 11.3*  10.8* 10.7*  HCT 32.9* 31.3* 31.2*  MCV 92.9 90.7 93.1  PLT 284 225 217   Cardiac Enzymes: No results for input(s): CKTOTAL, CKMB, CKMBINDEX, TROPONINI in the last 168 hours. BNP: Invalid input(s): POCBNP CBG: Recent Labs  Lab 12/29/20 1316 12/29/20 1638 12/29/20 2130 12/30/20 0828 12/30/20 1121  GLUCAP 109* 106* 174* 131* 151*   D-Dimer No results for input(s): DDIMER in the last 72 hours. Hgb A1c No results for input(s): HGBA1C in the last 72 hours. Lipid Profile No results for input(s): CHOL, HDL, LDLCALC, TRIG, CHOLHDL, LDLDIRECT in the last 72 hours. Thyroid function studies No results for input(s): TSH, T4TOTAL, T3FREE, THYROIDAB in the last 72 hours.  Invalid input(s): FREET3 Anemia work up No results for input(s): VITAMINB12, FOLATE, FERRITIN, TIBC, IRON, RETICCTPCT in the last 72 hours. Urinalysis    Component Value Date/Time   COLORURINE YELLOW (A) 12/16/2020 1805   APPEARANCEUR CLEAR (A) 12/16/2020 1805   APPEARANCEUR Cloudy (A) 12/16/2020 1435   LABSPEC 1.010 12/16/2020 1805   PHURINE 6.0 12/16/2020 1805   GLUCOSEU NEGATIVE 12/16/2020 1805   HGBUR MODERATE (A) 12/16/2020 1805   BILIRUBINUR NEGATIVE 12/16/2020 1805   BILIRUBINUR Negative 12/16/2020 [facility] 12/16/2020 1805   PROTEINUR NEGATIVE 12/16/2020 1805   PROTEINUR 1+ (A) 12/16/2020 1435   NITRITE NEGATIVE 12/16/2020 1805   NITRITE Negative 12/16/2020 1435   LEUKOCYTESUR MODERATE (A) 12/16/2020 1805   LEUKOCYTESUR 1+ (A) 12/16/2020 1435   Sepsis Labs Invalid input(s): PROCALCITONIN,  WBC,  LACTICIDVEN Microbiology Recent Results (from the past 240 hour(s))  Urine Culture     Status: None   Collection Time: 12/28/20  6:12 PM   Specimen: Urine, Random  Result Value Ref Range Status   Specimen Description   Final    URINE, RANDOM Performed at [facility], [address], [place], [place] [address]    Special Requests   Final    NONE Performed at [facility], [address], [place], [place] [address]    Culture   Final    NO GROWTH Performed at [facility], [address] [place], [place] [address]    Report Status 12/30/2020 FINAL  Final     Time coordinating discharge: Over 30 minutes  SIGNED:   Wyvonnia Dusky, MD  [facility] 12/30/2020, 2:00 PM Pager   If 7PM-7AM, please contact night-coverage

## 2020-12-30 NOTE — Progress Notes (Signed)
Urology Inpatient Progress Note  Subjective: No acute events overnight, patient is afebrile, VSS. Creatinine stable today, 1.23.  WBC count WNL, 8.2. Patient is voiding spontaneously and has passed 2 stone fragments versus small clots this morning.  He notes some mild dysuria associated with these.  Otherwise he states he is tolerating his stent well without abdominal or flank discomfort.  Additionally, he reports a black eye on the right side following surgery.  Anti-infectives: Anti-infectives (From admission, onward)   Start     Dose/Rate Route Frequency Ordered Stop   12/22/20 1200  aztreonam (AZACTAM) 2 g in sodium chloride 0.9 % 100 mL IVPB        2 g 33.3 mL/hr over 180 Minutes Intravenous Every 8 hours 12/22/20 0852     12/22/20 1200  ceftazidime-avibactam (AVYCAZ) 1.25 g in dextrose 5 % 50 mL IVPB        1.25 g 16.67 mL/hr over 3 Hours Intravenous Every 8 hours 12/22/20 0852     12/19/20 1230  ceftazidime-avibactam (AVYCAZ) 0.94 g in dextrose 5 % 50 mL IVPB  Status:  Discontinued        0.94 g 16.67 mL/hr over 3 Hours Intravenous Every 12 hours 12/19/20 1132 12/22/20 0852   12/19/20 1230  aztreonam (AZACTAM) 2 g in sodium chloride 0.9 % 100 mL IVPB  Status:  Discontinued        2 g 33.3 mL/hr over 180 Minutes Intravenous Every 12 hours 12/19/20 1132 12/22/20 0852   12/18/20 2245  levofloxacin (LEVAQUIN) IVPB 500 mg  Status:  Discontinued       "Followed by" Linked Group Details   500 mg 100 mL/hr over 60 Minutes Intravenous Every 48 hours 12/16/20 2147 12/19/20 1123   12/17/20 0000  cefTRIAXone (ROCEPHIN) 2 g in sodium chloride 0.9 % 100 mL IVPB  Status:  Discontinued        2 g 200 mL/hr over 30 Minutes Intravenous Every 24 hours 12/16/20 2110 12/16/20 2112   12/16/20 2245  levofloxacin (LEVAQUIN) IVPB 750 mg       "Followed by" Linked Group Details   750 mg 100 mL/hr over 90 Minutes Intravenous  Once 12/16/20 2147 12/16/20 2359   12/16/20 2115  levofloxacin (LEVAQUIN) IVPB  500 mg  Status:  Discontinued        500 mg 100 mL/hr over 60 Minutes Intravenous Every 24 hours 12/16/20 2112 12/16/20 2147   12/16/20 1745  cefTRIAXone (ROCEPHIN) 1 g in sodium chloride 0.9 % 100 mL IVPB        1 g 200 mL/hr over 30 Minutes Intravenous  Once 12/16/20 1742 12/16/20 1836      Current Facility-Administered Medications  Medication Dose Route Frequency Provider Last Rate Last Admin  . 0.9 %  sodium chloride infusion   Intravenous PRN Lorella Nimrod, MD 10 mL/hr at 12/26/20 2320 250 mL at 12/26/20 2320  . acetaminophen (TYLENOL) tablet 650 mg  650 mg Oral Q6H PRN Acheampong, Warnell Bureau, MD       Or  . acetaminophen (TYLENOL) suppository 650 mg  650 mg Rectal Q6H PRN Acheampong, Warnell Bureau, MD      . acidophilus (RISAQUAD) capsule 1 capsule  1 capsule Oral Daily Lorella Nimrod, MD   1 capsule at 12/28/20 0958  . aspirin EC tablet 81 mg  81 mg Oral Daily Acheampong, Warnell Bureau, MD   81 mg at 12/28/20 6606  . aztreonam (AZACTAM) 2 g in sodium chloride 0.9 % 100 mL IVPB  2 g Intravenous Q8H Berton Mount, RPH 33.3 mL/hr at 12/30/20 0513 2 g at 12/30/20 0513  . bisacodyl (DULCOLAX) EC tablet 5 mg  5 mg Oral Daily PRN Acheampong, Warnell Bureau, MD   5 mg at 12/18/20 2148  . ceftazidime-avibactam (AVYCAZ) 1.25 g in dextrose 5 % 50 mL IVPB  1.25 g Intravenous Q8H Berton Mount, RPH 16.67 mL/hr at 12/30/20 0509 1.25 g at 12/30/20 0509  . Chlorhexidine Gluconate Cloth 2 % PADS 6 each  6 each Topical Daily Lorella Nimrod, MD   6 each at 12/26/20 [phone]  . cholecalciferol (VITAMIN D3) tablet 1,000 Units  1,000 Units Oral Daily Acheampong, Warnell Bureau, MD   1,000 Units at 12/28/20 [phone]  . docusate sodium (COLACE) capsule 100 mg  100 mg Oral BID Artist Beach, MD   100 mg at 12/29/20 2058  . famotidine (PEPCID) tablet 20 mg  20 mg Oral QHS Lorella Nimrod, MD   20 mg at 12/29/20 2058  . heparin injection 5,000 Units  5,000 Units Subcutaneous Q8H Acheampong, Warnell Bureau, MD   5,000 Units at 12/30/20 0514  .  insulin aspart (novoLOG) injection 0-6 Units  0-6 Units Subcutaneous TID WC Acheampong, Warnell Bureau, MD   1 Units at 12/26/20 [phone]  . Netarsudil-Latanoprost 0.02-0.005 % SOLN 1 drop  1 drop Ophthalmic Daily Acheampong, Warnell Bureau, MD   1 drop at 12/29/20 2057  . ondansetron (ZOFRAN) tablet 4 mg  4 mg Oral Q6H PRN Acheampong, Warnell Bureau, MD       Or  . ondansetron (ZOFRAN) injection 4 mg  4 mg Intravenous Q6H PRN Acheampong, Warnell Bureau, MD      . pantoprazole (PROTONIX) EC tablet 40 mg  40 mg Oral QAC breakfast Acheampong, Warnell Bureau, MD   40 mg at 12/29/20 0825  . simvastatin (ZOCOR) tablet 20 mg  20 mg Oral QHS Artist Beach, MD   20 mg at 12/29/20 2058  . tamsulosin (FLOMAX) capsule 0.4 mg  0.4 mg Oral Daily Acheampong, Warnell Bureau, MD   0.4 mg at 12/28/20 7425   Objective: Vital signs in last 24 hours: Temp:  [96.7 F (35.9 C)-98.9 F (37.2 C)] 98.3 F (36.8 C) (06/01 0831) Pulse Rate:  [49-105] 105 (06/01 0831) Resp:  [16-21] 17 (06/01 0831) BP: (95-115)/(37-80) 110/72 (06/01 0831) SpO2:  [92 %-100 %] 92 % (06/01 0831) Weight:  [72.4 kg] 72.4 kg (05/31 0921)  Intake/Output from previous day: 05/31 0701 - 06/01 0700 In: -  Out: 1075 [Urine:1075] Intake/Output this shift: No intake/output data recorded.  Physical Exam Vitals and nursing note reviewed.  Constitutional:      General: He is not in acute distress.    Appearance: He is not ill-appearing, toxic-appearing or diaphoretic.  HENT:     Head: Normocephalic and atraumatic. Right periorbital erythema present.  Neurological:     Mental Status: He is alert.    Lab Results:  Recent Labs    12/29/20 0550 12/30/20 0529  WBC 7.1 8.2  HGB 10.8* 10.7*  HCT 31.3* 31.2*  PLT 225 217   BMET Recent Labs    12/29/20 0550 12/30/20 0529  NA 139 137  K 3.7 4.6  CL 110 107  CO2 20* 22  GLUCOSE 96 106*  BUN 37* 33*  CREATININE 1.26* 1.23  CALCIUM 8.8* 8.7*   Assessment & Plan: 87 year old male admitted with MDR Stenotrophomonas  febrile UTI associated with obstructing distal left ureteral calculi, now POD1 from left ureteroscopy with  laser lithotripsy and stent exchange with Dr. Bernardo Heater.  Surgery without complications, patient is voiding spontaneously, and is doing well clinically today.  Repeat urine culture is pending.  Okay for discharge from the urologic perspective.  He will require outpatient cystoscopy stent removal in 7 to 10 days with KUB prior.  Patient is in agreement with this plan.  Debroah Loop, PA-C 12/30/2020

## 2020-12-30 NOTE — Care Management Important Message (Signed)
Important Message  Patient Details  Name: David Myers MRN: [id number] Date of Birth: 1932-07-09   Medicare Important Message Given:  Yes  Patient is in an isolation room so I called his room [phone]) and reviewed the Important Message from Medicare with him.  He is very pleasant and said he was very happy with the care he has received here. Michela Pitcher he was in agreement with his discharge today and would not need a copy of this form.  I wished a speedy recovery and thanked him for his time.   Juliann Pulse A Caymen Dubray 12/30/2020, 1:11 PM

## 2020-12-30 NOTE — Progress Notes (Signed)
Pt d/c home with wife this afternoon d/c paperwork was reviewed with pt and wife and both expressed understanding.  NAD noted VSS.  IVs removed from pts L arm and hand without issue.  All personal belongings taken at time of d/c.  Pt was wheeled to med mall ent. And assisted into POV by NTs.

## 2020-12-31 LAB — MINIMUM INHIBITORY CONC. (1 DRUG)

## 2020-12-31 LAB — MIC RESULT

## 2021-01-01 LAB — URINE CULTURE: Culture: >=100000 — AB

## 2021-01-04 DIAGNOSIS — N3090 Cystitis, unspecified without hematuria: Secondary | ICD-10-CM | POA: Diagnosis not present

## 2021-01-04 DIAGNOSIS — N200 Calculus of kidney: Secondary | ICD-10-CM | POA: Diagnosis not present

## 2021-01-04 LAB — CALCULI, WITH PHOTOGRAPH (CLINICAL LAB)
Calcium Oxalate Dihydrate: 10 %
Calcium Oxalate Monohydrate: 90 %
Weight Calculi: 27 mg

## 2021-01-06 ENCOUNTER — Ambulatory Visit: Payer: Self-pay | Admitting: Urology

## 2021-01-08 ENCOUNTER — Ambulatory Visit
Admission: RE | Admit: 2021-01-08 | Discharge: 2021-01-08 | Disposition: A | Discharge status: Home / Self Care | Payer: PPO | Source: Ambulatory Visit | Attending: Physician Assistant | Admitting: Physician Assistant

## 2021-01-08 ENCOUNTER — Encounter: Payer: Self-pay | Admitting: Urology

## 2021-01-08 ENCOUNTER — Other Ambulatory Visit: Payer: Self-pay

## 2021-01-08 ENCOUNTER — Ambulatory Visit (INDEPENDENT_AMBULATORY_CARE_PROVIDER_SITE_OTHER): Payer: PPO | Admitting: Urology

## 2021-01-08 ENCOUNTER — Ambulatory Visit
Admission: RE | Admit: 2021-01-08 | Discharge: 2021-01-08 | Disposition: A | Discharge status: Home / Self Care | Payer: PPO | Attending: Physician Assistant | Admitting: Physician Assistant

## 2021-01-08 VITALS — BP 191/78 | HR 73 | Ht 68.0 in | Wt 155.0 lb

## 2021-01-08 DIAGNOSIS — N200 Calculus of kidney: Secondary | ICD-10-CM

## 2021-01-08 DIAGNOSIS — Z87442 Personal history of urinary calculi: Secondary | ICD-10-CM | POA: Diagnosis not present

## 2021-01-08 LAB — URINALYSIS, COMPLETE
Bilirubin, UA: NEGATIVE
Glucose, UA: NEGATIVE
Ketones, UA: NEGATIVE
Nitrite, UA: NEGATIVE
Specific Gravity, UA: 1.02 (ref 1.005–1.030)
Urobilinogen, Ur: 0.2 mg/dL (ref 0.2–1.0)
pH, UA: 5.5 (ref 5.0–7.5)

## 2021-01-08 LAB — MICROSCOPIC EXAMINATION
Bacteria, UA: NONE SEEN
RBC, Urine: >30 /hpf — AB (ref 0–2)
WBC, UA: >30 /hpf — AB (ref 0–5)

## 2021-01-08 MED ORDER — LEVOFLOXACIN 500 MG PO TABS
500.0000 mg | ORAL_TABLET | Freq: Once | ORAL | Status: AC
  Administered 2021-01-08: 500 mg via ORAL

## 2021-01-08 NOTE — Progress Notes (Signed)
Indications: Patient is 87 y.o., who is s/p ureteroscopic removal of left renal and ureteral calculi 12/29/2020.  He had no postoperative problems the patient is presenting today for stent removal.  Procedure:  Flexible Cystoscopy with stent removal (81191)  Timeout was performed and the correct patient, procedure and participants were identified.    Description:  The patient was prepped and draped in the usual sterile fashion. Flexible cystosopy was performed.  The stent was visualized, grasped, and removed intact without difficulty. The patient tolerated the procedure well.  A single dose of oral antibiotics was given.  Complications:  None  Plan:  Instructed to call for fever or flank pain post stent removal Stone analysis 90% calcium oxalate monohydrate/10% calcium dihydrate Metabolic evaluation was discussed.  He states he had a stone treated many years ago.  He will think about a metabolic evaluation versus general stone prevention guidelines and will discuss next month on follow-up Follow-up KUB 6 months Has follow-up scheduled 02/08/2021 post TURP   [name], MD

## 2021-01-14 ENCOUNTER — Other Ambulatory Visit (HOSPITAL_COMMUNITY): Payer: Self-pay | Admitting: Family Medicine

## 2021-01-14 ENCOUNTER — Other Ambulatory Visit: Payer: Self-pay | Admitting: Family Medicine

## 2021-01-14 DIAGNOSIS — Z8781 Personal history of (healed) traumatic fracture: Secondary | ICD-10-CM

## 2021-01-15 DIAGNOSIS — H02135 Senile ectropion of left lower eyelid: Secondary | ICD-10-CM | POA: Diagnosis not present

## 2021-01-15 DIAGNOSIS — H02132 Senile ectropion of right lower eyelid: Secondary | ICD-10-CM | POA: Diagnosis not present

## 2021-01-15 DIAGNOSIS — H10233 Serous conjunctivitis, except viral, bilateral: Secondary | ICD-10-CM | POA: Diagnosis not present

## 2021-01-15 DIAGNOSIS — Z961 Presence of intraocular lens: Secondary | ICD-10-CM | POA: Diagnosis not present

## 2021-01-15 DIAGNOSIS — R7309 Other abnormal glucose: Secondary | ICD-10-CM | POA: Diagnosis not present

## 2021-01-15 DIAGNOSIS — H401212 Low-tension glaucoma, right eye, moderate stage: Secondary | ICD-10-CM | POA: Diagnosis not present

## 2021-01-15 DIAGNOSIS — H401221 Low-tension glaucoma, left eye, mild stage: Secondary | ICD-10-CM | POA: Diagnosis not present

## 2021-01-19 DIAGNOSIS — R7309 Other abnormal glucose: Secondary | ICD-10-CM | POA: Diagnosis not present

## 2021-01-19 DIAGNOSIS — H401221 Low-tension glaucoma, left eye, mild stage: Secondary | ICD-10-CM | POA: Diagnosis not present

## 2021-01-19 DIAGNOSIS — H02135 Senile ectropion of left lower eyelid: Secondary | ICD-10-CM | POA: Diagnosis not present

## 2021-01-19 DIAGNOSIS — H10233 Serous conjunctivitis, except viral, bilateral: Secondary | ICD-10-CM | POA: Diagnosis not present

## 2021-01-19 DIAGNOSIS — Z961 Presence of intraocular lens: Secondary | ICD-10-CM | POA: Diagnosis not present

## 2021-01-19 DIAGNOSIS — H401212 Low-tension glaucoma, right eye, moderate stage: Secondary | ICD-10-CM | POA: Diagnosis not present

## 2021-01-19 DIAGNOSIS — H02132 Senile ectropion of right lower eyelid: Secondary | ICD-10-CM | POA: Diagnosis not present

## 2021-01-20 ENCOUNTER — Ambulatory Visit: Admission: RE | Admit: 2021-01-20 | Payer: PPO | Source: Ambulatory Visit

## 2021-01-21 ENCOUNTER — Ambulatory Visit
Admission: RE | Admit: 2021-01-21 | Discharge: 2021-01-21 | Disposition: A | Discharge status: Home / Self Care | Payer: PPO | Source: Ambulatory Visit | Attending: Family Medicine | Admitting: Family Medicine

## 2021-01-21 ENCOUNTER — Other Ambulatory Visit: Payer: Self-pay

## 2021-01-21 DIAGNOSIS — S22080A Wedge compression fracture of T11-T12 vertebra, initial encounter for closed fracture: Secondary | ICD-10-CM | POA: Diagnosis not present

## 2021-01-21 DIAGNOSIS — Z8781 Personal history of (healed) traumatic fracture: Secondary | ICD-10-CM | POA: Diagnosis not present

## 2021-01-26 ENCOUNTER — Other Ambulatory Visit: Payer: Self-pay | Admitting: Orthopedic Surgery

## 2021-01-26 DIAGNOSIS — H10233 Serous conjunctivitis, except viral, bilateral: Secondary | ICD-10-CM | POA: Diagnosis not present

## 2021-01-26 DIAGNOSIS — H401212 Low-tension glaucoma, right eye, moderate stage: Secondary | ICD-10-CM | POA: Diagnosis not present

## 2021-01-26 DIAGNOSIS — H02135 Senile ectropion of left lower eyelid: Secondary | ICD-10-CM | POA: Diagnosis not present

## 2021-01-26 DIAGNOSIS — Z961 Presence of intraocular lens: Secondary | ICD-10-CM | POA: Diagnosis not present

## 2021-01-26 DIAGNOSIS — H401221 Low-tension glaucoma, left eye, mild stage: Secondary | ICD-10-CM | POA: Diagnosis not present

## 2021-01-26 DIAGNOSIS — H02132 Senile ectropion of right lower eyelid: Secondary | ICD-10-CM | POA: Diagnosis not present

## 2021-01-26 DIAGNOSIS — R7309 Other abnormal glucose: Secondary | ICD-10-CM | POA: Diagnosis not present

## 2021-01-27 ENCOUNTER — Ambulatory Visit
Admission: RE | Admit: 2021-01-27 | Discharge: 2021-01-27 | Disposition: A | Discharge status: Home / Self Care | Payer: PPO | Attending: Orthopedic Surgery | Admitting: Orthopedic Surgery

## 2021-01-27 ENCOUNTER — Encounter
Admission: RE | Disposition: A | Discharge status: Home / Self Care | Payer: Self-pay | Source: Home / Self Care | Attending: Orthopedic Surgery

## 2021-01-27 ENCOUNTER — Encounter: Payer: Self-pay | Admitting: Orthopedic Surgery

## 2021-01-27 ENCOUNTER — Ambulatory Visit: Payer: PPO | Admitting: Anesthesiology

## 2021-01-27 ENCOUNTER — Ambulatory Visit: Payer: PPO

## 2021-01-27 ENCOUNTER — Other Ambulatory Visit: Payer: Self-pay

## 2021-01-27 DIAGNOSIS — Z86010 Personal history of colonic polyps: Secondary | ICD-10-CM | POA: Insufficient documentation

## 2021-01-27 DIAGNOSIS — Z7982 Long term (current) use of aspirin: Secondary | ICD-10-CM | POA: Diagnosis not present

## 2021-01-27 DIAGNOSIS — S22080S Wedge compression fracture of T11-T12 vertebra, sequela: Secondary | ICD-10-CM | POA: Insufficient documentation

## 2021-01-27 DIAGNOSIS — Z880 Allergy status to penicillin: Secondary | ICD-10-CM | POA: Insufficient documentation

## 2021-01-27 DIAGNOSIS — Z79899 Other long term (current) drug therapy: Secondary | ICD-10-CM | POA: Diagnosis not present

## 2021-01-27 DIAGNOSIS — W19XXXA Unspecified fall, initial encounter: Secondary | ICD-10-CM | POA: Diagnosis not present

## 2021-01-27 DIAGNOSIS — Y92012 Bathroom of single-family (private) house as the place of occurrence of the external cause: Secondary | ICD-10-CM | POA: Insufficient documentation

## 2021-01-27 DIAGNOSIS — Z8719 Personal history of other diseases of the digestive system: Secondary | ICD-10-CM | POA: Diagnosis not present

## 2021-01-27 DIAGNOSIS — K219 Gastro-esophageal reflux disease without esophagitis: Secondary | ICD-10-CM | POA: Diagnosis not present

## 2021-01-27 DIAGNOSIS — Z419 Encounter for procedure for purposes other than remedying health state, unspecified: Secondary | ICD-10-CM

## 2021-01-27 DIAGNOSIS — Z4889 Encounter for other specified surgical aftercare: Secondary | ICD-10-CM | POA: Diagnosis not present

## 2021-01-27 DIAGNOSIS — D7589 Other specified diseases of blood and blood-forming organs: Secondary | ICD-10-CM | POA: Diagnosis not present

## 2021-01-27 DIAGNOSIS — M8088XS Other osteoporosis with current pathological fracture, vertebra(e), sequela: Secondary | ICD-10-CM | POA: Insufficient documentation

## 2021-01-27 DIAGNOSIS — S22080A Wedge compression fracture of T11-T12 vertebra, initial encounter for closed fracture: Secondary | ICD-10-CM | POA: Diagnosis not present

## 2021-01-27 HISTORY — PX: KYPHOPLASTY: SHX5884

## 2021-01-27 LAB — GLUCOSE, CAPILLARY
Glucose-Capillary: 114 mg/dL — ABNORMAL HIGH (ref 70–99)
Glucose-Capillary: 121 mg/dL — ABNORMAL HIGH (ref 70–99)

## 2021-01-27 SURGERY — KYPHOPLASTY
Anesthesia: General

## 2021-01-27 MED ORDER — CEFAZOLIN SODIUM-DEXTROSE 2-4 GM/100ML-% IV SOLN
2.0000 g | INTRAVENOUS | Status: AC
  Administered 2021-01-27: 2 g via INTRAVENOUS

## 2021-01-27 MED ORDER — OXYCODONE HCL 5 MG/5ML PO SOLN: 5.0000 mg | Freq: Once | ORAL | Status: DC | PRN

## 2021-01-27 MED ORDER — KETAMINE HCL 50 MG/5ML IJ SOSY
PREFILLED_SYRINGE | INTRAMUSCULAR | Status: AC
  Filled 2021-01-27: qty 5

## 2021-01-27 MED ORDER — BUPIVACAINE-EPINEPHRINE (PF) 0.5% -1:200000 IJ SOLN
INTRAMUSCULAR | Status: DC | PRN
  Administered 2021-01-27: 20 mL

## 2021-01-27 MED ORDER — 0.9 % SODIUM CHLORIDE (POUR BTL) OPTIME
TOPICAL | Status: DC | PRN
  Administered 2021-01-27: 500 mL

## 2021-01-27 MED ORDER — SODIUM CHLORIDE 0.9 % IV SOLN: INTRAVENOUS | Status: DC

## 2021-01-27 MED ORDER — CHLORHEXIDINE GLUCONATE 0.12 % MT SOLN
OROMUCOSAL | Status: AC
  Filled 2021-01-27: qty 15

## 2021-01-27 MED ORDER — ACETAMINOPHEN 10 MG/ML IV SOLN
INTRAVENOUS | Status: AC
  Filled 2021-01-27: qty 100

## 2021-01-27 MED ORDER — LIDOCAINE HCL 1 % IJ SOLN
INTRAMUSCULAR | Status: DC | PRN
  Administered 2021-01-27: 20 mL via INTRADERMAL
  Administered 2021-01-27: 10 mL via INTRADERMAL

## 2021-01-27 MED ORDER — OXYCODONE HCL 5 MG PO TABS: 5.0000 mg | ORAL_TABLET | Freq: Once | ORAL | Status: DC | PRN

## 2021-01-27 MED ORDER — IOHEXOL 180 MG/ML  SOLN
INTRAMUSCULAR | Status: DC | PRN
  Administered 2021-01-27: 10 mL

## 2021-01-27 MED ORDER — ACETAMINOPHEN 10 MG/ML IV SOLN
INTRAVENOUS | Status: DC | PRN
  Administered 2021-01-27: 1000 mg via INTRAVENOUS

## 2021-01-27 MED ORDER — BUPIVACAINE-EPINEPHRINE (PF) 0.5% -1:200000 IJ SOLN
INTRAMUSCULAR | Status: AC
  Filled 2021-01-27: qty 30

## 2021-01-27 MED ORDER — ORAL CARE MOUTH RINSE: 15.0000 mL | Freq: Once | OROMUCOSAL | Status: AC

## 2021-01-27 MED ORDER — CHLORHEXIDINE GLUCONATE 0.12 % MT SOLN
15.0000 mL | Freq: Once | OROMUCOSAL | Status: AC
  Administered 2021-01-27: 15 mL via OROMUCOSAL

## 2021-01-27 MED ORDER — LIDOCAINE HCL (PF) 1 % IJ SOLN
INTRAMUSCULAR | Status: AC
  Filled 2021-01-27: qty 60

## 2021-01-27 MED ORDER — MIDAZOLAM HCL 2 MG/2ML IJ SOLN
INTRAMUSCULAR | Status: DC | PRN
  Administered 2021-01-27 (×2): 1 mg via INTRAVENOUS

## 2021-01-27 MED ORDER — KETAMINE HCL 10 MG/ML IJ SOLN
INTRAMUSCULAR | Status: DC | PRN
  Administered 2021-01-27 (×2): 10 mg via INTRAVENOUS
  Administered 2021-01-27: 5 mg via INTRAVENOUS

## 2021-01-27 MED ORDER — CEFAZOLIN SODIUM-DEXTROSE 2-4 GM/100ML-% IV SOLN
INTRAVENOUS | Status: AC
  Filled 2021-01-27: qty 100

## 2021-01-27 MED ORDER — TAMSULOSIN HCL 0.4 MG PO CAPS: 0.4000 mg | ORAL_CAPSULE | Freq: Every morning | ORAL | 0 refills | Status: AC

## 2021-01-27 MED ORDER — FENTANYL CITRATE (PF) 100 MCG/2ML IJ SOLN
25.0000 ug | INTRAMUSCULAR | Status: DC | PRN
Start: 2021-01-27 — End: 2021-01-28

## 2021-01-27 MED ORDER — MIDAZOLAM HCL 2 MG/2ML IJ SOLN
INTRAMUSCULAR | Status: AC
  Filled 2021-01-27: qty 2

## 2021-01-27 SURGICAL SUPPLY — 23 items
ADH SKN CLS APL DERMABOND .7 (GAUZE/BANDAGES/DRESSINGS) ×1
CEMENT KYPHON CX01A KIT/MIXER (Cement) ×3 IMPLANT
DERMABOND ADVANCED (GAUZE/BANDAGES/DRESSINGS) ×2
DERMABOND ADVANCED .7 DNX12 (GAUZE/BANDAGES/DRESSINGS) ×1 IMPLANT
DEVICE BIOPSY BONE KYPHX (INSTRUMENTS) ×3 IMPLANT
DRAPE C-ARM XRAY 36X54 (DRAPES) ×3 IMPLANT
DURAPREP 26ML APPLICATOR (WOUND CARE) ×3 IMPLANT
GAUZE 4X4 16PLY ~~LOC~~+RFID DBL (SPONGE) ×3 IMPLANT
GLOVE SURG SYN 9.0  PF PI (GLOVE) ×2
GLOVE SURG SYN 9.0 PF PI (GLOVE) ×1 IMPLANT
GOWN SRG 2XL LVL 4 RGLN SLV (GOWNS) ×1 IMPLANT
GOWN STRL NON-REIN 2XL LVL4 (GOWNS) ×3
GOWN STRL REUS W/ TWL LRG LVL3 (GOWN DISPOSABLE) ×1 IMPLANT
GOWN STRL REUS W/TWL LRG LVL3 (GOWN DISPOSABLE) ×3
MANIFOLD NEPTUNE II (INSTRUMENTS) ×3 IMPLANT
PACK KYPHOPLASTY (MISCELLANEOUS) ×3 IMPLANT
RENTAL RFA  GENERATOR (MISCELLANEOUS)
RENTAL RFA GENERATOR (MISCELLANEOUS) IMPLANT
STRAP SAFETY 5IN WIDE (MISCELLANEOUS) ×3 IMPLANT
SWABSTK COMLB BENZOIN TINCTURE (MISCELLANEOUS) ×3 IMPLANT
TRAY KYPHOPAK 15/2 EXPRESS (KITS) ×3 IMPLANT
TRAY KYPHOPAK 15/3 EXPRESS 1ST (MISCELLANEOUS) IMPLANT
TRAY KYPHOPAK 20/3 EXPRESS 1ST (MISCELLANEOUS) IMPLANT

## 2021-01-27 NOTE — Transfer of Care (Signed)
Immediate Anesthesia Transfer of Care Note  Patient: David Myers  Procedure(s) Performed: T 11 KYPHOPLASTY  Patient Location: PACU  Anesthesia Type:General  Level of Consciousness: drowsy and patient cooperative  Airway & Oxygen Therapy: Patient Spontanous Breathing and Patient connected to nasal cannula oxygen  Post-op Assessment: Report given to RN and Post -op Vital signs reviewed and stable  Post vital signs: Reviewed and stable  Last Vitals:  Vitals Value Taken Time  BP 146/70 01/27/21 1725  Temp    Pulse 81 01/27/21 1725  Resp 18 01/27/21 1725  SpO2 100 % 01/27/21 1725  Vitals shown include unvalidated device data.  Last Pain:  Vitals:   01/27/21 1413  TempSrc: Oral         Complications: No notable events documented.

## 2021-01-27 NOTE — H&P (Signed)
Chief Complaint  Patient presents with   Back Pain  H & P FOR KYPHOPLASTY   History of Present Illness:   David Myers is a 87 y.o. male that presents to clinic today for his history and physical for kyphoplasty of T11 by Dr. Rudene Christians on 01/27/2021. Patient presents with his wife.  His pain began following a fall in mid May 2022. He was initially seen on 12/15/2020 by Mr. Whitaker following a fall after getting off the commode. Main complaint following the fall was mid back pain with radiation around the ribs. Further evaluation was somewhat delayed as the patient was hospitalized for cystitis/hydronephrosis and underwent cystoscopy and ureteral stent placement as well as lithotripsy on 12/29/2020. Patient subsequently underwent an MRI of the thoracic spine on 01/22/2021 which showed compression fracture and bone edema at T11. Patient continues to have mild pain in this area, though the more significant complaint is the radiating pain around the ribs.   He has been staying active with walking 30-45 minutes a day but states he has had to stop due to the back pain.   Past Medical, Surgical, Family, Social History, Allergies, Medications:   Past Medical History:  Past Medical History:  Diagnosis Date   B12 deficiency 03/23/2015   BPH (benign prostatic hyperplasia) 02/10/2014   Colon polyps  hyperplastic   Compression injury 01/2002  traumatic, thoracic   Diabetes mellitus type 2, diet-controlled (CMS-HCC) 03/23/2015   Elevated PSA, less than 10 ng/ml 03/23/2015   History of Clostridium difficile   Nephrolithiasis 02/10/2014   Prostatitis   Past Surgical History:  Past Surgical History:  Procedure Laterality Date   COLONOSCOPY 03/15/1999  Hyperplastic [facility]   COLONOSCOPY 10/14/2008  [facility] (Son)   COLONOSCOPY 11/26/2013  [facility] (Son): No repeat due to age per RTE (dw)   EGD 03/24/2014  No repeat per RTE (dw)   left UPJ stone 12/31/2003  post lithotripsy   right wrist fracture  required  pins   transurethral resection of the prostate 11/17/2020   Current Medications:  Current Outpatient Medications  Medication Sig Dispense Refill   aspirin 81 MG EC tablet Take 81 mg by mouth once daily.   cholecalciferol (VITAMIN D3) 1,000 unit capsule Take 1,000 Units by mouth once daily.   cyanocobalamin, vitamin B-12, 1,000 mcg TbER Take 1 tablet by mouth once daily as needed   erythromycin (ROMYCIN) ophthalmic ointment APPLY SMALL AMOUNT TO EYELIDS AT BEDTIME X 14 DAYS   famotidine (PEPCID) 40 MG tablet TAKE 1 TABLET BY MOUTH EVERY DAY AT NIGHT 90 tablet 1   pantoprazole (PROTONIX) 40 MG DR tablet Take 1 tablet (40 mg total) by mouth once daily 90 tablet 3   ROCKLATAN 0.02-0.005 % ophthalmic solution Place 1 drop into both eyes nightly   simvastatin (ZOCOR) 20 MG tablet Take 1 tablet (20 mg total) by mouth nightly 90 tablet 3   tamsulosin (FLOMAX) 0.4 mg capsule Take by mouth   No current facility-administered medications for this visit.   Allergies:  Allergies  Allergen Reactions   Augmentin [Amoxicillin-Pot Clavulanate] Diarrhea   Social History:  Social History   Socioeconomic History   Marital status: Married  Tobacco Use   Smoking status: Never Smoker   Smokeless tobacco: Never Used  Scientific laboratory technician Use: Never used  Substance and Sexual Activity   Alcohol use: No  Alcohol/week: 0.0 standard drinks   Drug use: No   Sexual activity: Yes   Family History:  Family History  Problem Relation Age of Onset   COPD Mother   Emphysema Mother   Diabetes Mother   COPD Brother   Prostate cancer Brother   Review of Systems:   A 10+ ROS was performed, reviewed, and the pertinent orthopaedic findings are documented in the HPI.   Physical Examination:   BP 124/70 (BP Location: Left upper arm, Patient Position: Sitting, BP Cuff Size: Adult)  Ht 168.9 cm (5' 6.5")  Wt 73 kg (161 lb)  BMI 25.60 kg/m   Patient is a well-developed, well-nourished male in no acute  distress. Patient has normal mood and affect. Patient is alert and oriented to person, place, and time. Pupils are equal and round with synchronous movement. No injected sclera. There is no palpable lymphadenopathy. Respirations are normal, without noticeable retractions.   Cardiovascular: Regular rate and rhythm, with no murmurs/rubs/gallops  Respiratory: Lungs clear to auscultation bilaterally  Examination of the mid and low back shows skin that is clean and dry. Patient mildly tender to palpation over T11. Mildly tender to palpation over the left lower ribs.  Neurovascularly intact to the bilateral upper extremities.   Tests Performed/Reviewed:  X-rays  EXAM: MRI THORACIC SPINE WITHOUT CONTRAST  TECHNIQUE: Multiplanar, multisequence MR imaging of the thoracic spine was performed. No intravenous contrast was administered.  COMPARISON: Report of 12/15/2020 lumbar spine radiographs. Lumbar spine MRI 02/09/2020.  FINDINGS: Alignment: Minimal anterolisthesis of C7 on T1.  Vertebrae: T11 inferior endplate compression fracture with 55% vertebral body height loss and extensive marrow edema throughout the vertebral body. Minimal retropulsion of the posteroinferior T11 vertebral body cortex. Chronic T4 and L1 compression fractures with mild and moderate vertebral body height loss, respectively. Partially visualized chronic L2 compression fracture. No suspicious marrow lesion. Partially visualized edema in the posterior left eleventh rib.  Cord: Normal signal.  Paraspinal and other soft tissues: Hiatal hernia.  Disc levels:  Disc bulging at T2-3, a small left paracentral disc protrusion at T3-4, a small central disc extrusion at T6-7, a small central disc protrusion at T7-8, minimal disc bulging at T9-10 and T10-11, and minimal retropulsion of the T11 vertebral body do not result in significant spinal stenosis or significant spinal cord mass effect. There is relatively  widespread, moderate facet arthrosis in the thoracic spine without evidence of a high-grade neural foraminal stenosis.  IMPRESSION: 1. Acute or subacute T11 compression fracture with 55% height loss. 2. Partially visualized marrow edema in the posterior left eleventh rib, indeterminate for a fracture. 3. Chronic T4, L1, and L2 compression fractures. 4. Moderate thoracic disc and facet degeneration without compressive stenosis.  Electronically Signed By: Logan Bores M.D. On: 01/22/2021 16:05  I personally reviewed and visualized the imaging studies if available. I additionally personally interpreted any radiographs taken during today's visit.  Impression:   ICD-10-CM  1. Closed wedge compression fracture of T11 vertebra, initial encounter (CMS-HCC) S22.080A   Plan:   -Compression fracture of T11, subacute Treatment options were discussed with the patient. Advised patient that he would likely benefit from a kyphoplasty. This could help some of his lingering mid back pain as well as radicular symptoms that he is experiencing. Advised patient that he would likely stay in the hospital 1 night following the procedure, but he could be discharged the day of. Patient would like to proceed with the kyphoplasty.  Follow-up with Rachelle Hora pending procedure.  Contact our office with any questions or concerns. Follow up as indicated, or sooner should any new problems arise, if conditions worsen, or  if they are otherwise concerned.   Gwenlyn Fudge, PA-C [facility] [place], [place] [address] Phone: [phone]  This note was generated in part with voice recognition software and I apologize for any typographical errors that were not detected and corrected.  Electronically signed by Gwenlyn Fudge, PA at 01/26/2021 12:56 PM EDT  Reviewed  H+P. No changes noted.

## 2021-01-27 NOTE — Anesthesia Preprocedure Evaluation (Signed)
Anesthesia Evaluation  Patient identified by MRN, date of birth, ID band Patient awake    Reviewed: Allergy & Precautions, NPO status , Patient's Chart, lab work & pertinent test results  History of Anesthesia Complications Negative for: history of anesthetic complications  Airway Mallampati: III  TM Distance: >3 FB Neck ROM: full    Dental  (+) Chipped   Pulmonary neg shortness of breath, sleep apnea ,    Pulmonary exam normal        Cardiovascular Exercise Tolerance: Good hypertension, (-) angina+ Peripheral Vascular Disease  (-) Past MI Normal cardiovascular exam     Neuro/Psych PSYCHIATRIC DISORDERS TIA Neuromuscular disease CVA    GI/Hepatic Neg liver ROS, hiatal hernia, GERD  Medicated and Controlled,  Endo/Other  diabetes, Type 2  Renal/GU Renal disease  negative genitourinary   Musculoskeletal   Abdominal   Peds  Hematology negative hematology ROS (+)   Anesthesia Other Findings Past Medical History: No date: Arthritis No date: Diabetes mellitus without complication (HCC)     Comment:  diet controlled No date: GERD (gastroesophageal reflux disease) No date: History of hiatal hernia No date: History of kidney stones No date: Hypertension No date: Sleep apnea     Comment:  does not uses CPAP machine for many years  No date: Stroke [facility])     Comment:  2018  Past Surgical History: 08/12/2015: CATARACT EXTRACTION W/PHACO; Left     Comment:  Procedure: CATARACT EXTRACTION PHACO AND INTRAOCULAR               LENS PLACEMENT (IOC);  Surgeon: Leandrew Koyanagi, MD;               Location: [place];  Service: Ophthalmology;                Laterality: Left;  DIABETIC - diet controlled TORIC 09/09/2015: CATARACT EXTRACTION W/PHACO; Right     Comment:  Procedure: CATARACT EXTRACTION PHACO AND INTRAOCULAR               LENS PLACEMENT (IOC);  Surgeon: Leandrew Koyanagi, MD;               Location:  [place];  Service: Ophthalmology;                Laterality: Right;  DIABETIC - diet controlled No date: COLONOSCOPY 12/16/2020: CYSTOSCOPY WITH STENT PLACEMENT; Left     Comment:  Procedure: CYSTOSCOPY WITH STENT PLACEMENT;  Surgeon:               Abbie Sons, MD;  Location: [facility];  Service:               Urology;  Laterality: Left; 12/29/2020: CYSTOSCOPY/URETEROSCOPY/HOLMIUM LASER/STENT PLACEMENT; Left     Comment:  Procedure: CYSTOSCOPY/URETEROSCOPY/HOLMIUM LASER/STENT               PLACEMENT;  Surgeon: Abbie Sons, MD;  Location:               [facility];  Service: Urology;  Laterality: Left; No date: EYE SURGERY 11/17/2020: TRANSURETHRAL RESECTION OF PROSTATE; N/A     Comment:  Procedure: TRANSURETHRAL RESECTION OF THE PROSTATE               (TURP);  Surgeon: Abbie Sons, MD;  Location: [facility]               ORS;  Service: Urology;  Laterality: N/A; 12/16/2020: URETEROSCOPY; Left     Comment:  Procedure: URETEROSCOPY;  Surgeon: Abbie Sons, MD;              Location: [facility];  Service: Urology;  Laterality: Left; No date: WRIST SURGERY     Reproductive/Obstetrics negative OB ROS                             Anesthesia Physical Anesthesia Plan  ASA: 3  Anesthesia Plan: General   Post-op Pain Management:    Induction: Intravenous  PONV Risk Score and Plan: Propofol infusion and TIVA  Airway Management Planned: Natural Airway and Nasal Cannula  Additional Equipment:   Intra-op Plan:   Post-operative Plan:   Informed Consent: I have reviewed the patients History and Physical, chart, labs and discussed the procedure including the risks, benefits and alternatives for the proposed anesthesia with the patient or authorized representative who has indicated his/her understanding and acceptance.     Dental Advisory Given  Plan Discussed with: Anesthesiologist, CRNA and Surgeon  Anesthesia Plan Comments: (Patient  consented for risks of anesthesia including but not limited to:  - adverse reactions to medications - risk of airway placement if required - damage to eyes, teeth, lips or other oral mucosa - nerve damage due to positioning  - sore throat or hoarseness - Damage to heart, brain, nerves, lungs, other parts of body or loss of life  Patient voiced understanding.)        Anesthesia Quick Evaluation

## 2021-01-27 NOTE — Anesthesia Postprocedure Evaluation (Signed)
Anesthesia Post Note  Patient: David Myers  Procedure(s) Performed: T 11 KYPHOPLASTY  Patient location during evaluation: PACU Anesthesia Type: General Level of consciousness: awake and alert Pain management: pain level controlled Vital Signs Assessment: post-procedure vital signs reviewed and stable Respiratory status: spontaneous breathing, nonlabored ventilation, respiratory function stable and patient connected to nasal cannula oxygen Cardiovascular status: blood pressure returned to baseline and stable Postop Assessment: no apparent nausea or vomiting Anesthetic complications: no   No notable events documented.   Last Vitals:  Vitals:   01/27/21 1745 01/27/21 1800  BP: 140/75 (!) 144/80  Pulse: 72 69  Resp: 17 17  Temp:    SpO2: 100% 100%    Last Pain:  Vitals:   01/27/21 1413  TempSrc: Oral                 Precious Haws Quintel Mccalla

## 2021-01-27 NOTE — Op Note (Signed)
01/27/2021  5:22 PM  PATIENT:  David Myers   MRN: [id number]   PRE-OPERATIVE DIAGNOSIS:  closed wedge compression fracture of T11   POST-OPERATIVE DIAGNOSIS:  closed wedge compression fracture of T11   PROCEDURE:  Procedure(s): KYPHOPLASTY T11  SURGEON: Laurene Footman, MD   ASSISTANTS: None   ANESTHESIA:   local and MAC   EBL:  No intake/output data recorded.   BLOOD ADMINISTERED:none   DRAINS: none    LOCAL MEDICATIONS USED:  MARCAINE    and XYLOCAINE    SPECIMEN: T11 vertebral body biopsy   DISPOSITION OF SPECIMEN: Pathology   COUNTS:  YES   TOURNIQUET:  * No tourniquets in log *   IMPLANTS: Bone cement   DICTATION: .Dragon Dictation  patient was brought to the operating room and after adequate anesthesia was obtained the patient was placed prone.  C arm was brought in in good visualization of the affected level obtained on both AP and lateral projections.  After patient identification and timeout procedures were completed, local anesthetic was infiltrated with 10 cc 1% Xylocaine infiltrated subcutaneously.  This is done the area on the each side of the planned approach.  The back was then prepped and draped in the usual sterile manner and repeat timeout procedure carried out.  A spinal needle was brought down to the pedicle on the each side of T11 and a 50-50 mix of 1% Xylocaine half percent Sensorcaine with epinephrine total of 20 cc injected on each side.  After allowing this to set a small incision was made and the trocar was advanced into the vertebral body in an extrapedicular fashion.  Biopsy was obtained drilling was carried out balloon inserted with inflation to 2.5 cc on the right and 1.5 cc on the left.  When the cement was appropriate consistency 4 cc were injected on the right and 1 cc on the left into the vertebral body without extravasation, good fill superior to inferior endplates and from right to left sides along the inferior endplate.  After the  cement had set the trochar was removed and permanent C-arm views obtained.  The wound was closed with Dermabond followed by Band-Aid   PLAN OF CARE: Discharge home after recovery room   PATIENT DISPOSITION:  PACU - hemodynamically stable.

## 2021-01-27 NOTE — Discharge Instructions (Addendum)
Take it easy today and then tomorrow try to start walking. Try to avoid bending and lifting anything over 10 pounds for 2 weeks. Band-Aids come off on Friday then okay to shower. Resume all your regular medications. Call office if you are having problems.AMBULATORY SURGERY  DISCHARGE INSTRUCTIONS   The drugs that you were given will stay in your system until tomorrow so for the next 24 hours you should not:  Drive an automobile Make any legal decisions Drink any alcoholic beverage   You may resume regular meals tomorrow.  Today it is better to start with liquids and gradually work up to solid foods.  You may eat anything you prefer, but it is better to start with liquids, then soup and crackers, and gradually work up to solid foods.   Please notify your doctor immediately if you have any unusual bleeding, trouble breathing, redness and pain at the surgery site, drainage, fever, or pain not relieved by medication.    Additional Instructions:        Please contact your physician with any problems or Same Day Surgery at [phone], Monday through Friday 6 am to 4 pm, or [place] at [facility] number at [phone].

## 2021-01-28 ENCOUNTER — Encounter: Payer: Self-pay | Admitting: Orthopedic Surgery

## 2021-01-29 LAB — SURGICAL PATHOLOGY

## 2021-02-08 ENCOUNTER — Ambulatory Visit (INDEPENDENT_AMBULATORY_CARE_PROVIDER_SITE_OTHER): Payer: PPO | Admitting: Urology

## 2021-02-08 ENCOUNTER — Other Ambulatory Visit: Payer: Self-pay

## 2021-02-08 ENCOUNTER — Encounter: Payer: Self-pay | Admitting: Urology

## 2021-02-08 VITALS — BP 147/75 | HR 56 | Ht 68.0 in | Wt 162.0 lb

## 2021-02-08 DIAGNOSIS — N200 Calculus of kidney: Secondary | ICD-10-CM

## 2021-02-08 DIAGNOSIS — R339 Retention of urine, unspecified: Secondary | ICD-10-CM

## 2021-02-08 LAB — BLADDER SCAN AMB NON-IMAGING: Scan Result: 192

## 2021-02-08 NOTE — Progress Notes (Signed)
02/08/2021 9:16 AM   David Myers [birth year] [id number]  Referring provider: Rusty Aus, MD [facility] [place],  [place] [address]  Chief Complaint  Patient presents with   Routine Post Op    Urologic history: 1.  BPH with LUTS/incomplete bladder emptying TURP 11/17/2020  2.  Left distal ureteral calculi with infection Stent placement 12/16/2020 Ureteroscopic stone removal 12/29/2020   HPI: 87 y.o. male presents for postop follow-up.  Was treated for persistent UTI after TURP and found to have obstructing left distal ureteral calculi with urine culture positive for rare bacteria requiring prolonged hospitalization for IV antibiotics Underwent stent placement followed by definitive stone treatment 12/29/2020 Stent removed 01/08/2021 and he had no problems post stent removal Stone analysis CaOxMono/CaOxDi 90/10 Preop IPSS 23/35 States he is not sure he is voiding any better than preop though IPSS today 6/35    PMH: Past Medical History:  Diagnosis Date   Arthritis    Diabetes mellitus without complication ([place])    diet controlled   GERD (gastroesophageal reflux disease)    History of hiatal hernia    History of kidney stones    Hypertension    Sleep apnea    does not uses CPAP machine for many years    Stroke [facility])    2018    Surgical History: Past Surgical History:  Procedure Laterality Date   CATARACT EXTRACTION W/PHACO Left 08/12/2015   Procedure: CATARACT EXTRACTION PHACO AND INTRAOCULAR LENS PLACEMENT ([place]);  Surgeon: Leandrew Koyanagi, MD;  Location: [place];  Service: Ophthalmology;  Laterality: Left;  DIABETIC - diet controlled TORIC   CATARACT EXTRACTION W/PHACO Right 09/09/2015   Procedure: CATARACT EXTRACTION PHACO AND INTRAOCULAR LENS PLACEMENT (IOC);  Surgeon: Leandrew Koyanagi, MD;  Location: [place];  Service: Ophthalmology;  Laterality: Right;  DIABETIC - diet  controlled   COLONOSCOPY     CYSTOSCOPY WITH STENT PLACEMENT Left 12/16/2020   Procedure: CYSTOSCOPY WITH STENT PLACEMENT;  Surgeon: Abbie Sons, MD;  Location: [facility];  Service: Urology;  Laterality: Left;   CYSTOSCOPY/URETEROSCOPY/HOLMIUM LASER/STENT PLACEMENT Left 12/29/2020   Procedure: CYSTOSCOPY/URETEROSCOPY/HOLMIUM LASER/STENT PLACEMENT;  Surgeon: Abbie Sons, MD;  Location: [facility];  Service: Urology;  Laterality: Left;   EYE SURGERY     KYPHOPLASTY N/A 01/27/2021   Procedure: T 11 KYPHOPLASTY;  Surgeon: Hessie Knows, MD;  Location: [facility];  Service: Orthopedics;  Laterality: N/A;   TRANSURETHRAL RESECTION OF PROSTATE N/A 11/17/2020   Procedure: TRANSURETHRAL RESECTION OF THE PROSTATE (TURP);  Surgeon: Abbie Sons, MD;  Location: [facility];  Service: Urology;  Laterality: N/A;   URETEROSCOPY Left 12/16/2020   Procedure: URETEROSCOPY;  Surgeon: Abbie Sons, MD;  Location: [facility];  Service: Urology;  Laterality: Left;   WRIST SURGERY      Home Medications:  Allergies as of 02/08/2021       Reactions   Augmentin [amoxicillin-pot Clavulanate] Diarrhea   Led to C-diff         Medication List        Accurate as of February 08, 2021  9:16 AM. If you have any questions, ask your nurse or doctor.          acetaminophen 500 MG tablet Commonly known as: TYLENOL Take 500-1,000 mg by mouth every 6 (six) hours as needed (back pain).   aspirin EC 81 MG tablet Take 81 mg by mouth in the morning.   Cyanocobalamin 1000 MCG Tbcr Take 1  tablet by mouth daily as needed.   erythromycin ophthalmic ointment Place 1 application into both eyes at bedtime.   famotidine 40 MG tablet Commonly known as: PEPCID Take 40 mg by mouth at bedtime.   neomycin-polymyxin b-dexamethasone 3.5-10000-0.1 Susp Commonly known as: MAXITROL Place 1 drop into both eyes in the morning and at bedtime.   pantoprazole 40 MG tablet Commonly known as: PROTONIX Take 40 mg by mouth in the  morning.   Rocklatan 0.02-0.005 % Soln Generic drug: Netarsudil-Latanoprost Apply 1 drop to eye at bedtime.   simvastatin 20 MG tablet Commonly known as: ZOCOR Take 20 mg by mouth at bedtime.   tamsulosin 0.4 MG Caps capsule Commonly known as: FLOMAX Take 1 capsule (0.4 mg total) by mouth in the morning.   Vitamin D3 25 MCG (1000 UT) Caps Take 1,000 Units by mouth in the morning.        Allergies:  Allergies  Allergen Reactions   Augmentin [Amoxicillin-Pot Clavulanate] Diarrhea    Led to C-diff     Family History: Family History  Problem Relation Age of Onset   Hypertension Mother     Social History:  reports that he has never smoked. He has never used smokeless tobacco. He reports that he does not drink alcohol and does not use drugs.   Physical Exam: BP (!) 147/75   Pulse (!) 56   Ht 5\' 8"  (1.727 m)   Wt 162 lb (73.5 kg)   BMI 24.63 kg/m   Constitutional:  Alert and oriented, No acute distress. HEENT: [place] AT, moist mucus membranes.  Trachea midline, no masses. Cardiovascular: No clubbing, cyanosis, or edema. Respiratory: Normal respiratory effort, no increased work of breathing. Neurologic: Grossly intact, no focal deficits, moving all 4 extremities. Psychiatric: Normal mood and affect.   Assessment & Plan:    1.  BPH with LUTS Bladder scan PVR today 192 mL Cystoscopy at the time of stent placement/ureteroscopy noted wide open outlet and the elevated residual most likely due to bladder hypotonicity We will continue to monitor with a 63-month follow-up for PVR  2.  Nephrolithiasis Status post left ureteroscopy He elected general stone prevention guidelines and lieu of a metabolic evaluation Provided literature on stone prevention KUB 6 months   Abbie Sons, MD  [facility] [address], [place], [place] [address] [phone]

## 2021-02-10 DIAGNOSIS — L0201 Cutaneous abscess of face: Secondary | ICD-10-CM | POA: Diagnosis not present

## 2021-02-15 ENCOUNTER — Telehealth: Payer: Self-pay

## 2021-02-15 NOTE — Telephone Encounter (Signed)
I am not exactly sure what his wife is referring to.  He had mild voiding symptoms at our last visit and did not have any specific complaints.  Please see if you can get any additional information.  Thanks

## 2021-02-15 NOTE — Telephone Encounter (Signed)
Incoming call from pt's wife who states that Dr. Bernardo Heater mentioned a form they were supposed to fill out to help with patient's symptoms however they did not receive the form. Please advise.

## 2021-02-15 NOTE — Telephone Encounter (Signed)
Patient was just asking about the stone book . He is coming by office to pick it up

## 2021-02-23 DIAGNOSIS — H401212 Low-tension glaucoma, right eye, moderate stage: Secondary | ICD-10-CM | POA: Diagnosis not present

## 2021-02-23 DIAGNOSIS — H10239 Serous conjunctivitis, except viral, unspecified eye: Secondary | ICD-10-CM | POA: Diagnosis not present

## 2021-02-23 DIAGNOSIS — H02135 Senile ectropion of left lower eyelid: Secondary | ICD-10-CM | POA: Diagnosis not present

## 2021-02-23 DIAGNOSIS — H02132 Senile ectropion of right lower eyelid: Secondary | ICD-10-CM | POA: Diagnosis not present

## 2021-02-23 DIAGNOSIS — R7309 Other abnormal glucose: Secondary | ICD-10-CM | POA: Diagnosis not present

## 2021-02-23 DIAGNOSIS — Z961 Presence of intraocular lens: Secondary | ICD-10-CM | POA: Diagnosis not present

## 2021-02-23 DIAGNOSIS — H401221 Low-tension glaucoma, left eye, mild stage: Secondary | ICD-10-CM | POA: Diagnosis not present

## 2021-03-10 DIAGNOSIS — Z961 Presence of intraocular lens: Secondary | ICD-10-CM | POA: Diagnosis not present

## 2021-03-10 DIAGNOSIS — H02132 Senile ectropion of right lower eyelid: Secondary | ICD-10-CM | POA: Diagnosis not present

## 2021-03-10 DIAGNOSIS — R7309 Other abnormal glucose: Secondary | ICD-10-CM | POA: Diagnosis not present

## 2021-03-10 DIAGNOSIS — H02135 Senile ectropion of left lower eyelid: Secondary | ICD-10-CM | POA: Diagnosis not present

## 2021-03-10 DIAGNOSIS — H401221 Low-tension glaucoma, left eye, mild stage: Secondary | ICD-10-CM | POA: Diagnosis not present

## 2021-03-10 DIAGNOSIS — H401212 Low-tension glaucoma, right eye, moderate stage: Secondary | ICD-10-CM | POA: Diagnosis not present

## 2021-03-10 DIAGNOSIS — H10239 Serous conjunctivitis, except viral, unspecified eye: Secondary | ICD-10-CM | POA: Diagnosis not present

## 2021-03-12 DIAGNOSIS — E1151 Type 2 diabetes mellitus with diabetic peripheral angiopathy without gangrene: Secondary | ICD-10-CM | POA: Diagnosis not present

## 2021-03-19 DIAGNOSIS — E782 Mixed hyperlipidemia: Secondary | ICD-10-CM | POA: Diagnosis not present

## 2021-03-19 DIAGNOSIS — R0600 Dyspnea, unspecified: Secondary | ICD-10-CM | POA: Diagnosis not present

## 2021-03-19 DIAGNOSIS — R059 Cough, unspecified: Secondary | ICD-10-CM | POA: Diagnosis not present

## 2021-03-19 DIAGNOSIS — E1151 Type 2 diabetes mellitus with diabetic peripheral angiopathy without gangrene: Secondary | ICD-10-CM | POA: Diagnosis not present

## 2021-03-19 DIAGNOSIS — J40 Bronchitis, not specified as acute or chronic: Secondary | ICD-10-CM | POA: Diagnosis not present

## 2021-03-19 DIAGNOSIS — N1831 Chronic kidney disease, stage 3a: Secondary | ICD-10-CM | POA: Diagnosis not present

## 2021-03-19 DIAGNOSIS — E538 Deficiency of other specified B group vitamins: Secondary | ICD-10-CM | POA: Diagnosis not present

## 2021-03-31 DIAGNOSIS — H401212 Low-tension glaucoma, right eye, moderate stage: Secondary | ICD-10-CM | POA: Diagnosis not present

## 2021-03-31 DIAGNOSIS — H02132 Senile ectropion of right lower eyelid: Secondary | ICD-10-CM | POA: Diagnosis not present

## 2021-03-31 DIAGNOSIS — R7309 Other abnormal glucose: Secondary | ICD-10-CM | POA: Diagnosis not present

## 2021-03-31 DIAGNOSIS — Z961 Presence of intraocular lens: Secondary | ICD-10-CM | POA: Diagnosis not present

## 2021-03-31 DIAGNOSIS — H10239 Serous conjunctivitis, except viral, unspecified eye: Secondary | ICD-10-CM | POA: Diagnosis not present

## 2021-03-31 DIAGNOSIS — H02135 Senile ectropion of left lower eyelid: Secondary | ICD-10-CM | POA: Diagnosis not present

## 2021-03-31 DIAGNOSIS — H401221 Low-tension glaucoma, left eye, mild stage: Secondary | ICD-10-CM | POA: Diagnosis not present

## 2021-04-08 DIAGNOSIS — L570 Actinic keratosis: Secondary | ICD-10-CM | POA: Diagnosis not present

## 2021-04-08 DIAGNOSIS — Z85828 Personal history of other malignant neoplasm of skin: Secondary | ICD-10-CM | POA: Diagnosis not present

## 2021-04-08 DIAGNOSIS — D225 Melanocytic nevi of trunk: Secondary | ICD-10-CM | POA: Diagnosis not present

## 2021-04-08 DIAGNOSIS — L821 Other seborrheic keratosis: Secondary | ICD-10-CM | POA: Diagnosis not present

## 2021-04-08 DIAGNOSIS — D2261 Melanocytic nevi of right upper limb, including shoulder: Secondary | ICD-10-CM | POA: Diagnosis not present

## 2021-04-23 ENCOUNTER — Other Ambulatory Visit: Payer: Self-pay

## 2021-04-23 ENCOUNTER — Ambulatory Visit
Admission: RE | Admit: 2021-04-23 | Discharge: 2021-04-23 | Disposition: A | Discharge status: Home / Self Care | Payer: PPO | Source: Ambulatory Visit | Attending: Family Medicine | Admitting: Family Medicine

## 2021-04-23 ENCOUNTER — Other Ambulatory Visit: Payer: Self-pay | Admitting: Family Medicine

## 2021-04-23 DIAGNOSIS — R5383 Other fatigue: Secondary | ICD-10-CM

## 2021-04-23 DIAGNOSIS — R2689 Other abnormalities of gait and mobility: Secondary | ICD-10-CM | POA: Diagnosis not present

## 2021-04-23 DIAGNOSIS — R410 Disorientation, unspecified: Secondary | ICD-10-CM

## 2021-04-28 ENCOUNTER — Ambulatory Visit: Payer: Self-pay | Admitting: Urology

## 2021-05-06 DIAGNOSIS — R42 Dizziness and giddiness: Secondary | ICD-10-CM | POA: Diagnosis not present

## 2021-05-06 DIAGNOSIS — Z23 Encounter for immunization: Secondary | ICD-10-CM | POA: Diagnosis not present

## 2021-05-06 DIAGNOSIS — G9349 Other encephalopathy: Secondary | ICD-10-CM | POA: Diagnosis not present

## 2021-05-08 ENCOUNTER — Observation Stay
Admission: EM | Admit: 2021-05-08 | Discharge: 2021-05-09 | Disposition: A | Discharge status: Home / Self Care | Payer: PPO | Attending: Internal Medicine | Admitting: Internal Medicine

## 2021-05-08 ENCOUNTER — Other Ambulatory Visit: Payer: Self-pay

## 2021-05-08 ENCOUNTER — Observation Stay: Payer: PPO

## 2021-05-08 ENCOUNTER — Emergency Department: Payer: PPO

## 2021-05-08 DIAGNOSIS — I639 Cerebral infarction, unspecified: Principal | ICD-10-CM | POA: Insufficient documentation

## 2021-05-08 DIAGNOSIS — R4182 Altered mental status, unspecified: Secondary | ICD-10-CM | POA: Diagnosis not present

## 2021-05-08 DIAGNOSIS — Z8673 Personal history of transient ischemic attack (TIA), and cerebral infarction without residual deficits: Secondary | ICD-10-CM | POA: Diagnosis not present

## 2021-05-08 DIAGNOSIS — I672 Cerebral atherosclerosis: Secondary | ICD-10-CM | POA: Diagnosis not present

## 2021-05-08 DIAGNOSIS — R29818 Other symptoms and signs involving the nervous system: Secondary | ICD-10-CM | POA: Diagnosis not present

## 2021-05-08 DIAGNOSIS — I10 Essential (primary) hypertension: Secondary | ICD-10-CM | POA: Diagnosis not present

## 2021-05-08 DIAGNOSIS — Z7982 Long term (current) use of aspirin: Secondary | ICD-10-CM | POA: Insufficient documentation

## 2021-05-08 DIAGNOSIS — J329 Chronic sinusitis, unspecified: Secondary | ICD-10-CM | POA: Diagnosis not present

## 2021-05-08 DIAGNOSIS — E1151 Type 2 diabetes mellitus with diabetic peripheral angiopathy without gangrene: Secondary | ICD-10-CM | POA: Insufficient documentation

## 2021-05-08 DIAGNOSIS — M952 Other acquired deformity of head: Secondary | ICD-10-CM | POA: Diagnosis not present

## 2021-05-08 DIAGNOSIS — Z20822 Contact with and (suspected) exposure to covid-19: Secondary | ICD-10-CM | POA: Insufficient documentation

## 2021-05-08 DIAGNOSIS — R0902 Hypoxemia: Secondary | ICD-10-CM | POA: Diagnosis not present

## 2021-05-08 DIAGNOSIS — R410 Disorientation, unspecified: Secondary | ICD-10-CM | POA: Diagnosis not present

## 2021-05-08 DIAGNOSIS — G319 Degenerative disease of nervous system, unspecified: Secondary | ICD-10-CM | POA: Diagnosis not present

## 2021-05-08 DIAGNOSIS — Z79899 Other long term (current) drug therapy: Secondary | ICD-10-CM | POA: Insufficient documentation

## 2021-05-08 DIAGNOSIS — M5136 Other intervertebral disc degeneration, lumbar region: Secondary | ICD-10-CM | POA: Diagnosis present

## 2021-05-08 DIAGNOSIS — G459 Transient cerebral ischemic attack, unspecified: Secondary | ICD-10-CM | POA: Diagnosis not present

## 2021-05-08 DIAGNOSIS — R4781 Slurred speech: Secondary | ICD-10-CM | POA: Diagnosis not present

## 2021-05-08 DIAGNOSIS — E782 Mixed hyperlipidemia: Secondary | ICD-10-CM | POA: Diagnosis present

## 2021-05-08 DIAGNOSIS — Z9079 Acquired absence of other genital organ(s): Secondary | ICD-10-CM

## 2021-05-08 DIAGNOSIS — R29810 Facial weakness: Secondary | ICD-10-CM | POA: Diagnosis not present

## 2021-05-08 DIAGNOSIS — R972 Elevated prostate specific antigen [PSA]: Secondary | ICD-10-CM | POA: Diagnosis present

## 2021-05-08 DIAGNOSIS — K449 Diaphragmatic hernia without obstruction or gangrene: Secondary | ICD-10-CM | POA: Diagnosis not present

## 2021-05-08 DIAGNOSIS — F0150 Vascular dementia without behavioral disturbance: Secondary | ICD-10-CM | POA: Diagnosis present

## 2021-05-08 LAB — COMPREHENSIVE METABOLIC PANEL
ALT: 27 U/L (ref 0–44)
AST: 21 U/L (ref 15–41)
Albumin: 3.6 g/dL (ref 3.5–5.0)
Alkaline Phosphatase: 50 U/L (ref 38–126)
Anion gap: 7 (ref 5–15)
BUN: 19 mg/dL (ref 8–23)
CO2: 27 mmol/L (ref 22–32)
Calcium: 8.8 mg/dL — ABNORMAL LOW (ref 8.9–10.3)
Chloride: 102 mmol/L (ref 98–111)
Creatinine, Ser: 1.21 mg/dL (ref 0.61–1.24)
GFR, Estimated: 58 mL/min — ABNORMAL LOW (ref >60)
Glucose, Bld: 190 mg/dL — ABNORMAL HIGH (ref 70–99)
Potassium: 4.1 mmol/L (ref 3.5–5.1)
Sodium: 136 mmol/L (ref 135–145)
Total Bilirubin: 0.8 mg/dL (ref 0.3–1.2)
Total Protein: 7.4 g/dL (ref 6.5–8.1)

## 2021-05-08 LAB — RESP PANEL BY RT-PCR (FLU A&B, COVID) ARPGX2
Influenza A by PCR: NEGATIVE
Influenza B by PCR: NEGATIVE
SARS Coronavirus 2 by RT PCR: NEGATIVE

## 2021-05-08 LAB — CBC
HCT: 38.8 % — ABNORMAL LOW (ref 39.0–52.0)
Hemoglobin: 13.7 g/dL (ref 13.0–17.0)
MCH: 32.8 pg (ref 26.0–34.0)
MCHC: 35.3 g/dL (ref 30.0–36.0)
MCV: 92.8 fL (ref 80.0–100.0)
Platelets: 177 10*3/uL (ref 150–400)
RBC: 4.18 MIL/uL — ABNORMAL LOW (ref 4.22–5.81)
RDW: 12.9 % (ref 11.5–15.5)
WBC: 6.2 10*3/uL (ref 4.0–10.5)
nRBC: 0 % (ref 0.0–0.2)

## 2021-05-08 LAB — DIFFERENTIAL
Abs Immature Granulocytes: 0.02 10*3/uL (ref 0.00–0.07)
Basophils Absolute: 0 10*3/uL (ref 0.0–0.1)
Basophils Relative: 1 %
Eosinophils Absolute: 0.4 10*3/uL (ref 0.0–0.5)
Eosinophils Relative: 7 %
Immature Granulocytes: 0 %
Lymphocytes Relative: 33 %
Lymphs Abs: 2 10*3/uL (ref 0.7–4.0)
Monocytes Absolute: 0.5 10*3/uL (ref 0.1–1.0)
Monocytes Relative: 8 %
Neutro Abs: 3.1 10*3/uL (ref 1.7–7.7)
Neutrophils Relative %: 51 %

## 2021-05-08 LAB — PROCALCITONIN: Procalcitonin: <0.1 ng/mL

## 2021-05-08 LAB — CBG MONITORING, ED: Glucose-Capillary: 203 mg/dL — ABNORMAL HIGH (ref 70–99)

## 2021-05-08 LAB — APTT: aPTT: 29 seconds (ref 24–36)

## 2021-05-08 LAB — PROTIME-INR
INR: 1.1 (ref 0.8–1.2)
Prothrombin Time: 13.7 seconds (ref 11.4–15.2)

## 2021-05-08 MED ORDER — CLOPIDOGREL BISULFATE 75 MG PO TABS
75.0000 mg | ORAL_TABLET | Freq: Every day | ORAL | Status: DC
  Administered 2021-05-09: 75 mg via ORAL
  Filled 2021-05-08: qty 1

## 2021-05-08 MED ORDER — HYDRALAZINE HCL 20 MG/ML IJ SOLN: 5.0000 mg | Freq: Four times a day (QID) | INTRAMUSCULAR | Status: DC | PRN

## 2021-05-08 MED ORDER — DONEPEZIL HCL 5 MG PO TABS
5.0000 mg | ORAL_TABLET | Freq: Every day | ORAL | Status: DC
  Administered 2021-05-08: 5 mg via ORAL
  Filled 2021-05-08: qty 1

## 2021-05-08 MED ORDER — SODIUM CHLORIDE 0.9% FLUSH
3.0000 mL | Freq: Once | INTRAVENOUS | Status: AC
Start: 2021-05-08 — End: 2021-05-08
  Administered 2021-05-08: 3 mL via INTRAVENOUS

## 2021-05-08 MED ORDER — ASPIRIN EC 81 MG PO TBEC
81.0000 mg | DELAYED_RELEASE_TABLET | Freq: Every day | ORAL | Status: DC
  Administered 2021-05-09: 81 mg via ORAL
  Filled 2021-05-08: qty 1

## 2021-05-08 MED ORDER — CLOPIDOGREL BISULFATE 75 MG PO TABS
300.0000 mg | ORAL_TABLET | Freq: Once | ORAL | Status: AC
  Administered 2021-05-08: 300 mg via ORAL

## 2021-05-08 MED ORDER — ACETAMINOPHEN 325 MG PO TABS: 650.0000 mg | ORAL_TABLET | ORAL | Status: DC | PRN

## 2021-05-08 MED ORDER — LORAZEPAM 2 MG/ML IJ SOLN: 1.0000 mg | Freq: Once | INTRAMUSCULAR | Status: DC | PRN

## 2021-05-08 MED ORDER — ACETAMINOPHEN 650 MG RE SUPP: 650.0000 mg | RECTAL | Status: DC | PRN

## 2021-05-08 MED ORDER — ACETAMINOPHEN 160 MG/5ML PO SOLN
650.0000 mg | ORAL | Status: DC | PRN
  Filled 2021-05-08: qty 20.3

## 2021-05-08 MED ORDER — ASPIRIN 325 MG PO TABS
650.0000 mg | ORAL_TABLET | Freq: Once | ORAL | Status: AC
  Administered 2021-05-08: 325 mg via ORAL
  Filled 2021-05-08: qty 2

## 2021-05-08 MED ORDER — SIMVASTATIN 20 MG PO TABS
20.0000 mg | ORAL_TABLET | Freq: Every day | ORAL | Status: DC
  Administered 2021-05-08: 22:00:00 20 mg via ORAL
  Filled 2021-05-08: qty 1

## 2021-05-08 MED ORDER — PANTOPRAZOLE SODIUM 40 MG PO TBEC
40.0000 mg | DELAYED_RELEASE_TABLET | Freq: Every morning | ORAL | Status: DC
  Administered 2021-05-09: 40 mg via ORAL
  Filled 2021-05-08: qty 1

## 2021-05-08 MED ORDER — FAMOTIDINE 20 MG PO TABS
40.0000 mg | ORAL_TABLET | Freq: Every day | ORAL | Status: DC
  Administered 2021-05-08: 40 mg via ORAL
  Filled 2021-05-08: qty 2

## 2021-05-08 MED ORDER — STROKE: EARLY STAGES OF RECOVERY BOOK: Freq: Once | Status: DC

## 2021-05-08 NOTE — ED Notes (Addendum)
Neurologist met pt in CT at this time.

## 2021-05-08 NOTE — ED Notes (Signed)
Moved pt to room , pt is still in ct at this time. Awaiting in room 13 for patient , pts wife in room #13 at this time

## 2021-05-08 NOTE — Progress Notes (Signed)
Responded to code stroke page. Met with patient and wife bedside. Provided requested prayer and support.

## 2021-05-08 NOTE — ED Notes (Signed)
Per pt and pts wife he took 325 asa at home prior to coming to Emergency room ,

## 2021-05-08 NOTE — Consult Note (Signed)
Neurology Consultation  Reason for Consult: slurrd speech, left sided weakness Referring Physician: Dr Archie Balboa  CC: Left-sided weakness, facial droop, slurred speech  History is obtained from: EMS, patient  HPI: David Myers is a 87 y.o. male past medical history of diabetes, hypertension, sleep apnea does not use CPAP, prior stroke/TIA with no residual deficits, brought in for evaluation of sudden onset of left-sided arm and leg weakness along with left facial droop and slurred speech.  Her his last known well was 2:35 PM and this was a witnessed event by family.  They got concerned about a stroke.  EMS was called.  According to the EMS, symptoms resolved so they did not activate a code stroke.  At triage she was evaluated again and mild left-sided drift was noted for which a code stroke was activated in the hospital. Systolic blood pressure 443 CBG in the 200s Patient feels back to baseline at this time. He was seen and evaluated in the CT scanner-imaging reviewed personally   LKW: 2:35 PM today tpa given?: no, NIH 0 Premorbid modified Rankin scale (mRS):0   ROS: Full ROS was performed and is negative except as noted in the HPI.  Past Medical History:  Diagnosis Date   Arthritis    Diabetes mellitus without complication (HCC)    diet controlled   GERD (gastroesophageal reflux disease)    History of hiatal hernia    History of kidney stones    Hypertension    Sleep apnea    does not uses CPAP machine for many years    Stroke [facility])    2018        Family History  Problem Relation Age of Onset   Hypertension Mother      Social History:   reports that he has never smoked. He has never used smokeless tobacco. He reports that he does not drink alcohol and does not use drugs.  Medications No current facility-administered medications for this encounter.  Current Outpatient Medications:    acetaminophen (TYLENOL) 500 MG tablet, Take 500-1,000 mg by mouth every 6  (six) hours as needed (back pain)., Disp: , Rfl:    aspirin EC 81 MG tablet, Take 81 mg by mouth in the morning., Disp: , Rfl:    Cholecalciferol (VITAMIN D3) 25 MCG (1000 UT) CAPS, Take 1,000 Units by mouth in the morning., Disp: , Rfl:    Cyanocobalamin 1000 MCG TBCR, Take 1 tablet by mouth daily as needed., Disp: , Rfl:    erythromycin ophthalmic ointment, Place 1 application into both eyes at bedtime., Disp: , Rfl:    famotidine (PEPCID) 40 MG tablet, Take 40 mg by mouth at bedtime., Disp: , Rfl:    neomycin-polymyxin b-dexamethasone (MAXITROL) 3.5-10000-0.1 SUSP, Place 1 drop into both eyes in the morning and at bedtime., Disp: , Rfl:    Netarsudil-Latanoprost (ROCKLATAN) 0.02-0.005 % SOLN, Apply 1 drop to eye at bedtime., Disp: , Rfl:    pantoprazole (PROTONIX) 40 MG tablet, Take 40 mg by mouth in the morning., Disp: , Rfl:    simvastatin (ZOCOR) 20 MG tablet, Take 20 mg by mouth at bedtime., Disp: , Rfl:    Exam: Current vital signs: BP (!) 154/86 (BP Location: Right Arm)   Pulse (!) 58   Temp 98.3 F (36.8 C) (Oral)   Resp 17   SpO2 93%  Vital signs in last 24 hours: Temp:  [98.3 F (36.8 C)] 98.3 F (36.8 C) (10/08 1620) Pulse Rate:  [58-87] 58 (10/08 1624)  Resp:  [17-18] 17 (10/08 1620) BP: (145-159)/(78-93) 154/86 (10/08 1624) SpO2:  [93 %-98 %] 93 % (10/08 1624) General: Awake alert in no distress HEENT: Normocephalic/atraumatic Lungs: Clear Cardiovascular: Regular rate rhythm Abdomen nondistended nontender Extremities warm well perfused Neurological exam Awake alert oriented x3 Speech is nondysarthric No evidence of aphasia Cranial nerves II to XII intact Motor exam with no drift in any of the 4 extremities Sensation intact to light touch without extinction on double simultaneous stimulation Coordination with no dysmetria NIH stroke scale-0 Labs I have reviewed labs in epic and the results pertinent to this consultation are:  CBC    Component Value  Date/Time   WBC 8.2 12/30/2020 0529   RBC 3.35 (L) 12/30/2020 0529   HGB 10.7 (L) 12/30/2020 0529   HCT 31.2 (L) 12/30/2020 0529   PLT 217 12/30/2020 0529   MCV 93.1 12/30/2020 0529   MCH 31.9 12/30/2020 0529   MCHC 34.3 12/30/2020 0529   RDW 14.1 12/30/2020 0529   LYMPHSABS 0.9 12/16/2020 1533   MONOABS 0.8 12/16/2020 1533   EOSABS 0.1 12/16/2020 1533   BASOSABS 0.0 12/16/2020 1533    CMP     Component Value Date/Time   NA 137 12/30/2020 0529   K 4.6 12/30/2020 0529   CL 107 12/30/2020 0529   CO2 22 12/30/2020 0529   GLUCOSE 106 (H) 12/30/2020 0529   BUN 33 (H) 12/30/2020 0529   CREATININE 1.23 12/30/2020 0529   CREATININE 1.25 01/07/2013 0840   CALCIUM 8.7 (L) 12/30/2020 0529   PROT 7.3 06/09/2018 1453   ALBUMIN 3.6 06/09/2018 1453   AST 23 06/09/2018 1453   ALT 20 06/09/2018 1453   ALKPHOS 49 06/09/2018 1453   BILITOT 0.5 06/09/2018 1453   GFRNONAA 57 (L) 12/30/2020 0529   GFRNONAA 54 (L) 01/07/2013 0840   GFRAA >60 06/09/2018 1453   GFRAA >60 01/07/2013 0840    Imaging I have reviewed the images obtained: CT head without contrast with no acute changes.  Aspects 10.  No bleed  Assessment: 87 year old with above past medical tree presenting with sudden onset of left-sided facial weakness, slurred speech, left arm and leg weakness that nearly ha had completely resolved by the time of his arrival but at triage was noted to have subtle left upper extremity drift for which a code stroke was activated. On my examination, patient's NIH stroke scale is 0. Given his risk factors and prior similar episodes, I would recommend admission for a TIA work-up.  Impression-transient focal neurodeficit-evaluate for TIA versus stroke  Recommendations: Admit to hospitalist Telemetry Frequent neurochecks MRI brain without contrast MRA head without contrast Carotid Dopplers 2D echo A1c Lipid panel Given presentation consistent with a high risk TIA with low NIH, would recommend  load with aspirin 650 and Plavix 300 today followed by aspirin 81+ Plavix 75 daily from tomorrow. Continue home dose of simvastatin.  Goal LDL less than 70.  Make adjustments after lipid panel is obtained PT OT Speech therapy  Plan discussed with Dr. Archie Balboa in the ER  -- Amie Portland, MD Neurologist [facility] Pager: [phone]

## 2021-05-08 NOTE — ED Notes (Signed)
Tele neuro RN aware code stroke called on pt

## 2021-05-08 NOTE — ED Notes (Signed)
Informed RN bed assigned 

## 2021-05-08 NOTE — ED Triage Notes (Signed)
Pt via EMS from home. Per wife endorse L sided facial droop and slurred speech at 1435. Speech is now clear and no facial droop noted. This RN witnessed L sided drift on the assessment. Denies numbness or weakness.   Wife gave him 324 of ASA. Pt has a TIA in the past.   Pt is A&OX4 and NAD.

## 2021-05-08 NOTE — Progress Notes (Signed)
CODE STROKE- PHARMACY COMMUNICATION   Time CODE STROKE called/page received:[id number]  Time response to CODE STROKE was made (in person or via phone):   Time Stroke Kit retrieved from [place] (only if needed):N/A  Name of Provider/Nurse contacted:N/A  *TIA, no TNK recommended by neurology*  Past Medical History:  Diagnosis Date   Arthritis    Diabetes mellitus without complication ([place])    diet controlled   GERD (gastroesophageal reflux disease)    History of hiatal hernia    History of kidney stones    Hypertension    Sleep apnea    does not uses CPAP machine for many years    Stroke [facility])    2018   Prior to Admission medications   Medication Sig Start Date End Date Taking? Authorizing Provider  acetaminophen (TYLENOL) 500 MG tablet Take 500-1,000 mg by mouth every 6 (six) hours as needed (back pain).    [provider]  aspirin EC 81 MG tablet Take 81 mg by mouth in the morning.    [provider]  Cholecalciferol (VITAMIN D3) 25 MCG (1000 UT) CAPS Take 1,000 Units by mouth in the morning.    [provider]  Cyanocobalamin 1000 MCG TBCR Take 1 tablet by mouth daily as needed.    [provider]  erythromycin ophthalmic ointment Place 1 application into both eyes at bedtime. 01/15/21   [provider]  famotidine (PEPCID) 40 MG tablet Take 40 mg by mouth at bedtime. 05/28/20   [provider]  neomycin-polymyxin b-dexamethasone (MAXITROL) 3.5-10000-0.1 SUSP Place 1 drop into both eyes in the morning and at bedtime. 01/19/21   [provider]  Netarsudil-Latanoprost (ROCKLATAN) 0.02-0.005 % SOLN Apply 1 drop to eye at bedtime.    [provider]  pantoprazole (PROTONIX) 40 MG tablet Take 40 mg by mouth in the morning. 09/17/19   [provider]  simvastatin (ZOCOR) 20 MG tablet Take 20 mg by mouth at bedtime. 06/03/19   [provider]    Stellar Gensel Rodriguez-Guzman PharmD, BCPS 05/08/2021 4:55 PM

## 2021-05-08 NOTE — ED Provider Notes (Signed)
[facility] Emergency Department Provider Note  ____________________________________________   I have reviewed the triage vital signs and the nursing notes.   HISTORY  Chief Complaint Altered Mental Status   History limited by: Dementia, history primarily obtained from wife at bedside   HPI Valiant Denzel Hickel is a 87 y.o. male who presents to the emergency department today because of concern for stroke like symptoms. Wife states that at roughly 230 this afternoon she noticed that the patient was having some slurred speech as well as a left facial droop. She says that the symptoms did start to resolve while still at home. The patient had similar symptoms once in the past. The wife does state that ever since the patient had a hospitalization over the summer for UTI and sepsis they have noticed some baseline increased memory issues. The patient himself is unsure why he is in the emergency department. Wife did give the patient 57 of aspirin at home.   Records reviewed. Per medical record review patient has a history of TIA  Past Medical History:  Diagnosis Date   Arthritis    Diabetes mellitus without complication (HCC)    diet controlled   GERD (gastroesophageal reflux disease)    History of hiatal hernia    History of kidney stones    Hypertension    Sleep apnea    does not uses CPAP machine for many years    Stroke [facility])    2018    Patient Active Problem List   Diagnosis Date Noted   Incomplete bladder emptying 02/08/2021   Acute cystitis with hematuria    AKI (acute kidney injury) ([place])    Pyelonephritis 12/16/2020   S/P TURP (status post transurethral resection of prostate) 11/17/2020   Vascular dementia without behavioral disturbance ([place]) 04/30/2020   Chronic bilateral low back pain with bilateral sciatica 03/05/2020   Lumbar stenosis with neurogenic claudication 03/05/2020   History of transient ischemic attack (TIA) 06/15/2018   Numbness  and tingling 06/09/2018   TIA (transient ischemic attack) 06/09/2018   Diabetes mellitus with peripheral angiopathy ([place]) 04/09/2018   Hyperlipidemia, mixed 03/28/2017   Low serum vitamin D 09/26/2016   Medicare annual wellness visit, initial 09/26/2016   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   B12 deficiency 03/23/2015   Elevated PSA, less than 10 ng/ml 03/23/2015   Degenerative lumbar disc 10/08/2014   Nephrolithiasis 02/10/2014    Past Surgical History:  Procedure Laterality Date   CATARACT EXTRACTION W/PHACO Left 08/12/2015   Procedure: CATARACT EXTRACTION PHACO AND INTRAOCULAR LENS PLACEMENT ([place]);  Surgeon: Leandrew Koyanagi, MD;  Location: [place];  Service: Ophthalmology;  Laterality: Left;  DIABETIC - diet controlled TORIC   CATARACT EXTRACTION W/PHACO Right 09/09/2015   Procedure: CATARACT EXTRACTION PHACO AND INTRAOCULAR LENS PLACEMENT ([facility]);  Surgeon: Leandrew Koyanagi, MD;  Location: [place];  Service: Ophthalmology;  Laterality: Right;  DIABETIC - diet controlled   COLONOSCOPY     CYSTOSCOPY WITH STENT PLACEMENT Left 12/16/2020   Procedure: CYSTOSCOPY WITH STENT PLACEMENT;  Surgeon: Abbie Sons, MD;  Location: [facility];  Service: Urology;  Laterality: Left;   CYSTOSCOPY/URETEROSCOPY/HOLMIUM LASER/STENT PLACEMENT Left 12/29/2020   Procedure: CYSTOSCOPY/URETEROSCOPY/HOLMIUM LASER/STENT PLACEMENT;  Surgeon: Abbie Sons, MD;  Location: [facility];  Service: Urology;  Laterality: Left;   EYE SURGERY     KYPHOPLASTY N/A 01/27/2021   Procedure: T 11 KYPHOPLASTY;  Surgeon: Hessie Knows, MD;  Location: [facility];  Service: Orthopedics;  Laterality: N/A;   TRANSURETHRAL  RESECTION OF PROSTATE N/A 11/17/2020   Procedure: TRANSURETHRAL RESECTION OF THE PROSTATE (TURP);  Surgeon: Abbie Sons, MD;  Location: [facility];  Service: Urology;  Laterality: N/A;   URETEROSCOPY Left 12/16/2020   Procedure: URETEROSCOPY;  Surgeon: Abbie Sons, MD;   Location: [facility];  Service: Urology;  Laterality: Left;   WRIST SURGERY      Prior to Admission medications   Medication Sig Start Date End Date Taking? Authorizing Provider  acetaminophen (TYLENOL) 500 MG tablet Take 500-1,000 mg by mouth every 6 (six) hours as needed (back pain).    [provider]  aspirin EC 81 MG tablet Take 81 mg by mouth in the morning.    [provider]  Cholecalciferol (VITAMIN D3) 25 MCG (1000 UT) CAPS Take 1,000 Units by mouth in the morning.    [provider]  Cyanocobalamin 1000 MCG TBCR Take 1 tablet by mouth daily as needed.    [provider]  erythromycin ophthalmic ointment Place 1 application into both eyes at bedtime. 01/15/21   [provider]  famotidine (PEPCID) 40 MG tablet Take 40 mg by mouth at bedtime. 05/28/20   [provider]  neomycin-polymyxin b-dexamethasone (MAXITROL) 3.5-10000-0.1 SUSP Place 1 drop into both eyes in the morning and at bedtime. 01/19/21   [provider]  Netarsudil-Latanoprost (ROCKLATAN) 0.02-0.005 % SOLN Apply 1 drop to eye at bedtime.    [provider]  pantoprazole (PROTONIX) 40 MG tablet Take 40 mg by mouth in the morning. 09/17/19   [provider]  simvastatin (ZOCOR) 20 MG tablet Take 20 mg by mouth at bedtime. 06/03/19   [provider]    Allergies Augmentin [amoxicillin-pot clavulanate]  Family History  Problem Relation Age of Onset   Hypertension Mother     Social History Social History   Tobacco Use   Smoking status: Never   Smokeless tobacco: Never  Vaping Use   Vaping Use: Never used  Substance Use Topics   Alcohol use: No   Drug use: Never    Review of Systems Constitutional: No fever/chills Eyes: No visual changes. ENT: No sore throat. Cardiovascular: Denies chest pain. Respiratory: Denies shortness of breath. Gastrointestinal: No abdominal pain.  No nausea, no vomiting.  No diarrhea.    Genitourinary: Negative for dysuria. Musculoskeletal: Negative for back pain. Skin: Negative for rash. Neurological: Positive for slurred speech and left sided facial droop.   ____________________________________________   PHYSICAL EXAM:  VITAL SIGNS: ED Triage Vitals [05/08/21 1619]  Enc Vitals Group     BP (!) 159/93     Pulse Rate 68     Resp 18     Temp 98.3 F (36.8 C)     Temp Source Oral     SpO2 98 %     Weight      Height      Head Circumference      Peak Flow      Pain Score 0   Constitutional: Awake and alert. Not completely oriented to events.  Eyes: Conjunctivae are normal.  ENT      Head: Normocephalic and atraumatic.      Nose: No congestion/rhinnorhea.      Mouth/Throat: Mucous membranes are moist.      Neck: No stridor. Hematological/Lymphatic/Immunilogical: No cervical lymphadenopathy. Cardiovascular: Normal rate, regular rhythm.  No murmurs, rubs, or gallops.  Respiratory: Normal respiratory effort without tachypnea nor retractions. Breath sounds are clear and equal bilaterally. No wheezes/rales/rhonchi. Gastrointestinal: Soft and non  tender. No rebound. No guarding.  Genitourinary: Deferred Musculoskeletal: Normal range of motion in all extremities. No lower extremity edema. Neurologic:  Dementia, otherwise normal speech. No facial asymmetry. PERRL. EOMI. No gross focal neurologic deficits are appreciated.  Skin:  Skin is warm, dry and intact. No rash noted. Psychiatric: Calm  ____________________________________________    LABS (pertinent positives/negatives)  INR 1.1 CMP wnl except glu 190, ca 8.8 CBC wbc 6.2, hgb 13.7, plt 177  ____________________________________________   EKG  I, Nance Pear, attending physician, personally viewed and interpreted this EKG  EKG Time: 1628 Rate: 60 Rhythm: sinus rhythm Axis: normal Intervals: qtc 421 QRS: narrow ST changes: no st elevation Impression: normal  ekg   ____________________________________________    RADIOLOGY  CT head No acute abnormality   ____________________________________________   PROCEDURES  Procedures  ____________________________________________   INITIAL IMPRESSION / ASSESSMENT AND PLAN / ED COURSE  Pertinent labs & imaging results that were available during my care of the patient were reviewed by me and considered in my medical decision making (see chart for details).  Patient presented to the emergency department today because of concerns for possible TIA.  Wife describes some slurred speech and left facial droop.  Patient was called a code stroke upon arrival to the emergency department.  Dr. Rory Percy with neurology did evaluate.  At the time of his exam patient without any symptoms.  Because of this and the decision was made not to give tPA. CT head without any acute abnormality.  Will however plan on admission for TIA work-up.  ____________________________________________   FINAL CLINICAL IMPRESSION(S) / ED DIAGNOSES  Final diagnoses:  TIA (transient ischemic attack)     Note: This dictation was prepared with Dragon dictation. Any transcriptional errors that result from this process are unintentional     Nance Pear, MD 05/08/21 1735

## 2021-05-08 NOTE — H&P (Addendum)
History and Physical   David Myers BRA:[id number] DOB: 10/07/1931 DOA: 05/08/2021  PCP: David Aus, MD  Outpatient Specialists: Dr. Tamala Julian, orthopedic surgery Patient coming from: home via EMS  I have personally briefly reviewed patient's old medical records in [place].  Chief Concern: Left arm and leg weakness, facial droop, slurred speech  HPI: David Myers is a 87 y.o. male with medical history significant for hyperlipidemia, GERD, history of TIA, dementia, history of UTI and sepsis requiring hopsitalization, generalized weakness, and debility, history of covid infection January 2022, who presents to the emergency department from home for chief concerns of left arm and leg weakness, facial droop, slurred speech.  Spouse at bedside states that at approximately 2 pm/2:15 p.m., she noticed her spouse had slurred speech and then he developed facial droop.  She states that the facial droop lasted approximately less than 15 minutes. The slurred speech/delayed response lasted longer.  She reports that when EMS arrived, patient still had delayed responses and slurred speech.  She further endorses that he has baseline balance difficulty for the last few weeks. He has had to use a cane to walk in the driveway.  She presented to her primary care doctor David Myers who prescribed patient's on memory pills.  At bedside, David Myers was able to tell me his full name, his age, current location of hospital, and identified his wife by name at bedside.  On physical exam, I can appreciate mild left upper extremity weakness compared to the right.  He had bilateral lower extremity strength is 5/5. There is no facial droop per spouse at bedside.  Spouse at bedside further endorses unintentional weight loss of about 10-15 pounds since being discharged from the hospital.   Social history: He lives at home with his wife. He denies tobacco, etoh, recreational drug use. 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   Vaccination history: He is vaccinated for covid 19, Pfizer, two doses. He also tested positive for covid 19 in January 2022.   ROS: Constitutional: + weight change, no fever ENT/Mouth: no sore throat, no rhinorrhea Eyes: no eye pain, no vision changes Cardiovascular: no chest pain, no dyspnea,  no edema, no palpitations Respiratory: no cough, no sputum, no wheezing Gastrointestinal: no nausea, no vomiting, no diarrhea, no constipation Genitourinary: no urinary incontinence, no dysuria, no hematuria Musculoskeletal: no arthralgias, no myalgias Skin: no skin lesions, no pruritus, Neuro: + weakness, no loss of consciousness, no syncope Psych: no anxiety, no depression, + decrease appetite Heme/Lymph: no bruising, no bleeding  ED Course: Discussed with emergency medicine provider, patient requiring hospitalization for chief concerns of TIA.  Vitals in the emergency department is remarkable for temperature of 98.2, respiration rate of 14, heart rate of 58, blood pressure 145/87, SPO2 of 93% on room air.  Labs in the emergency department is remarkable for sodium 136, potassium 4.1, chloride 102, bicarb 27, BUN of 19, serum creatinine of 1.21, nonfasting blood glucose 190, WBC of 6.2, hemoglobin 13.7, platelets 177. eGFR is 58.  ED provider consulted neurology who recommended aspirin bolus 650 mg p.o., loading Plavix of 300 mg p.o.    Neurology further recommends aspirin 81 mg, Plavix 75 mg starting on 05/09/2021.  Assessment/Plan  Principal Problem:   History of transient ischemic attack (TIA) Active Problems:   TIA (transient ischemic attack)   Degenerative lumbar disc   Diabetes mellitus with peripheral angiopathy (HCC)   Elevated PSA, less than 10 ng/ml   Hyperlipidemia,  mixed   Vascular dementia without behavioral disturbance (HCC)   S/P TURP (status post transurethral resection of prostate)   # Stroke-like symptoms-etiology work-up in  progress, differentials include TIA/stroke - CT head without contrast read as below - Neurology has been consulted and we appreciate further recommendations - Complete echo - Neurology recommends: MRI of the brain and MRA of the brain without contrast which have been ordered - One-time dose as needed of Ativan 1 mg IV ordered, for anxiety, can be given 15 to 30 minutes prior to MRI - Fasting lipid and A1c ordered in the a.m., goal LDL less than 70 - Permissive hypertension  - Hydralazine 5 mg IV every 6 hours as needed for SBP greater than 180, 4 doses ordered a.m. team to continue of indicated - Frequent neuro vascular checks - N.p.o. pending swallow study - PT, OT, SLP - Fall precaution  - Admit to MedSurg, observation, telemetry  # Hyperlipidemia-simvastatin 20 mg nightly resumed  # History of memory difficulty-I recommend the patient's spouse, Mrs. David Myers be allowed to stay overnight to prevent sundowning and/or acute delirium - Resume donepezil 5 mg nightly  # History of obstructive sleep apnea-noncompliant with CPAP, CPAP nightly ordered  # GERD-famotidine 40 mg nightly, pantoprazole 40 mg every morning resumed  Chart reviewed.   Hospitalization from 12/16/2020 to 12/30/2020: Acute left pyelonephritis with multidrug resistant stenotrophomonas infection.  Infectious disease was consulted.  Repeat urine culture on 12/29/2020 showed no growth.  Patient was also treated for nephrolithiasis with obstruction, status post cystoscopy and urethral stent placement on 12/16/2020.  He also received left ureteroscopy, stone removal, ureteroscopic laser lithotripsy, left urethral stent exchange on 12/29/2020 per urology.  Patient was also treated with acute kidney injury on CKD stage IV.  DVT prophylaxis: SCDs Code Status: full code   Diet: heart healthy, nectar thick  Family Communication: Ms. David Myers  Disposition Plan: Pending clinical course Consults called:  Neurology Admission status: Admit to [place], observation, telemetry  Past Medical History:  Diagnosis Date   Arthritis    Diabetes mellitus without complication ([place])    diet controlled   GERD (gastroesophageal reflux disease)    History of hiatal hernia    History of kidney stones    Hypertension    Sleep apnea    does not uses CPAP machine for many years    Stroke [facility])    2018   Past Surgical History:  Procedure Laterality Date   CATARACT EXTRACTION W/PHACO Left 08/12/2015   Procedure: CATARACT EXTRACTION PHACO AND INTRAOCULAR LENS PLACEMENT ([place]);  Surgeon: Leandrew Koyanagi, MD;  Location: [place];  Service: Ophthalmology;  Laterality: Left;  DIABETIC - diet controlled TORIC   CATARACT EXTRACTION W/PHACO Right 09/09/2015   Procedure: CATARACT EXTRACTION PHACO AND INTRAOCULAR LENS PLACEMENT ([facility]);  Surgeon: Leandrew Koyanagi, MD;  Location: [facility];  Service: Ophthalmology;  Laterality: Right;  DIABETIC - diet controlled   COLONOSCOPY     CYSTOSCOPY WITH STENT PLACEMENT Left 12/16/2020   Procedure: CYSTOSCOPY WITH STENT PLACEMENT;  Surgeon: Abbie Sons, MD;  Location: [facility];  Service: Urology;  Laterality: Left;   CYSTOSCOPY/URETEROSCOPY/HOLMIUM LASER/STENT PLACEMENT Left 12/29/2020   Procedure: CYSTOSCOPY/URETEROSCOPY/HOLMIUM LASER/STENT PLACEMENT;  Surgeon: Abbie Sons, MD;  Location: [facility];  Service: Urology;  Laterality: Left;   EYE SURGERY     KYPHOPLASTY N/A 01/27/2021   Procedure: T 11 KYPHOPLASTY;  Surgeon: Hessie Knows, MD;  Location: [facility];  Service: Orthopedics;  Laterality: N/A;   TRANSURETHRAL  RESECTION OF PROSTATE N/A 11/17/2020   Procedure: TRANSURETHRAL RESECTION OF THE PROSTATE ([facility]P);  Surgeon: Abbie Sons, MD;  Location: [facility];  Service: Urology;  Laterality: N/A;   URETEROSCOPY Left 12/16/2020   Procedure: URETEROSCOPY;  Surgeon: Abbie Sons, MD;  Location: [facility];  Service: Urology;  Laterality: Left;    WRIST SURGERY     Social History:  reports that he has never smoked. He has never used smokeless tobacco. He reports that he does not drink alcohol and does not use drugs.  Allergies  Allergen Reactions   Augmentin [Amoxicillin-Pot Clavulanate] Diarrhea    Led to C-diff    Family History  Problem Relation Age of Onset   Hypertension Mother    Family history: Family history reviewed and not pertinent  Prior to Admission medications   Medication Sig Start Date End Date Taking? Authorizing Provider  acetaminophen (TYLENOL) 500 MG tablet Take 500-1,000 mg by mouth every 6 (six) hours as needed (back pain).    [provider]  aspirin EC 81 MG tablet Take 81 mg by mouth in the morning.    [provider]  Cholecalciferol (VITAMIN D3) 25 MCG (1000 UT) CAPS Take 1,000 Units by mouth in the morning.    [provider]  Cyanocobalamin 1000 MCG TBCR Take 1 tablet by mouth daily as needed.    [provider]  erythromycin ophthalmic ointment Place 1 application into both eyes at bedtime. 01/15/21   [provider]  famotidine (PEPCID) 40 MG tablet Take 40 mg by mouth at bedtime. 05/28/20   [provider]  neomycin-polymyxin b-dexamethasone (MAXITROL) 3.5-10000-0.1 SUSP Place 1 drop into both eyes in the morning and at bedtime. 01/19/21   [provider]  Netarsudil-Latanoprost (ROCKLATAN) 0.02-0.005 % SOLN Apply 1 drop to eye at bedtime.    [provider]  pantoprazole (PROTONIX) 40 MG tablet Take 40 mg by mouth in the morning. 09/17/19   [provider]  simvastatin (ZOCOR) 20 MG tablet Take 20 mg by mouth at bedtime. 06/03/19   [provider]   Physical Exam: Vitals:   05/08/21 1619 05/08/21 1620 05/08/21 1624 05/08/21 1658  BP: (!) 159/93 (!) 145/78 (!) 154/86 (!) 145/87  Pulse: 68 87 (!) 58 (!) 58  Resp: $Remo'18 17  14  'zbXOk$ Temp: 98.3 F (36.8 C) 98.3 F (36.8 C)  98.2 F (36.8 C)  TempSrc: Oral Oral   Oral  SpO2: 98% 98% 93% 93%   Constitutional: appears age-appropriate, frail, NAD, calm, comfortable Eyes: PERRL, lids and conjunctivae normal ENMT: Mucous membranes are moist. Posterior pharynx clear of any exudate or lesions. Age-appropriate dentition.  Mild to moderate hearing loss present. Neck: normal, supple, no masses, no thyromegaly Respiratory: clear to auscultation bilaterally, no wheezing, no crackles. Normal respiratory effort. No accessory muscle use.  Cardiovascular: Regular rate and rhythm, no murmurs / rubs / gallops. No extremity edema. 2+ pedal pulses. No carotid bruits.  Abdomen: no tenderness, no masses palpated, no hepatosplenomegaly. Bowel sounds positive.  Musculoskeletal: no clubbing / cyanosis. No joint deformity upper and lower extremities. Good ROM, no contractures, no atrophy. Normal muscle tone.  Skin: no rashes, lesions, ulcers. No induration Neurologic: Sensation intact. Strength 5/5 in bilateral lower extremity and right upper extremity.  Patient had mild 4.5/5 strength of his left upper extremity. Psychiatric: Normal judgment and insight. Alert and oriented x 3. Normal mood.   EKG: independently reviewed, showing sinus rhythm with a rate of 60, QTc 421  Chest  x-ray on Admission: I personally reviewed and I agree with radiologist reading as below.  CT HEAD CODE STROKE WO CONTRAST  Result Date: 05/08/2021 CLINICAL DATA:  Code stroke. Neuro deficit, acute, stroke suspected. Additional history provided: Slurred speech, left-sided droop, last known well 2:35 p.m. EXAM: CT HEAD WITHOUT CONTRAST TECHNIQUE: Contiguous axial images were obtained from the base of the skull through the vertex without intravenous contrast. COMPARISON:  Head CT 04/23/2021. brain MRI 06/09/2018. FINDINGS: Brain: Mild generalized cerebral and cerebellar atrophy. Moderate/advanced patchy and ill-defined hypoattenuation within the cerebral white matter, nonspecific but compatible with chronic  small vessel ischemic disease. Prominent perivascular space versus chronic lacunar infarct within the left thalamus, unchanged. There is no acute intracranial hemorrhage. No demarcated cortical infarct. No extra-axial fluid collection. No evidence of an intracranial mass. No midline shift. Vascular: No hyperdense vessel.  Atherosclerotic calcifications. Skull: Normal. Negative for fracture or focal lesion. Sinuses/Orbits: Visualized orbits show no acute finding. Mild mucosal thickening within the left maxillary sinus at the imaged levels. Mild mucosal thickening within the bilateral ethmoid sinuses. Redemonstrated chronic deformity of the outer table of the right frontal sinus. ASPECTS ([place] Stroke Program Early CT Score) - Ganglionic level infarction (caudate, lentiform nuclei, internal capsule, insula, M1-M3 cortex): 7 - Supraganglionic infarction (M4-M6 cortex): 3 Total score (0-10 with 10 being normal): 10 These results were communicated to Dr. Rory Percy at 4:22 pmon 10/8/2022by text page via the [facility] messaging system. IMPRESSION: No evidence of acute intracranial abnormality.  ASPECTS is 10. Moderate/severe chronic small vessel ischemic changes within the cerebral white matter. Prominent perivascular space versus chronic lacunar infarct within the left thalamus, unchanged. Mild generalized parenchymal atrophy. Paranasal sinus disease, as described. Electronically Signed   By: Kellie Simmering D.O.   On: 05/08/2021 16:22    Labs on Admission: I have personally reviewed following labs  CBC: Recent Labs  Lab 05/08/21 1610  WBC 6.2  NEUTROABS 3.1  HGB 13.7  HCT 38.8*  MCV 92.8  PLT 545   Basic Metabolic Panel: Recent Labs  Lab 05/08/21 1610  NA 136  K 4.1  CL 102  CO2 27  GLUCOSE 190*  BUN 19  CREATININE 1.21  CALCIUM 8.8*   GFR: CrCl cannot be calculated (Unknown ideal weight.).  Liver Function Tests: Recent Labs  Lab 05/08/21 1610  AST 21  ALT 27  ALKPHOS 50  BILITOT 0.8  PROT  7.4  ALBUMIN 3.6   Coagulation Profile: Recent Labs  Lab 05/08/21 1610  INR 1.1   Urine analysis:    Component Value Date/Time   COLORURINE YELLOW (A) 12/16/2020 1805   APPEARANCEUR Cloudy (A) 01/08/2021 0759   LABSPEC 1.010 12/16/2020 1805   PHURINE 6.0 12/16/2020 1805   GLUCOSEU Negative 01/08/2021 0759   HGBUR MODERATE (A) 12/16/2020 1805   BILIRUBINUR Negative 01/08/2021 0759   KETONESUR NEGATIVE 12/16/2020 1805   PROTEINUR 2+ (A) 01/08/2021 0759   PROTEINUR NEGATIVE 12/16/2020 1805   NITRITE Negative 01/08/2021 0759   NITRITE NEGATIVE 12/16/2020 1805   LEUKOCYTESUR 1+ (A) 01/08/2021 0759   LEUKOCYTESUR MODERATE (A) 12/16/2020 1805   Dr. Tobie Poet [facility]  If 7PM-7AM, please contact overnight-coverage provider If 7AM-7PM, please contact day coverage provider [email or web address]  05/08/2021, 5:21 PM

## 2021-05-08 NOTE — ED Notes (Addendum)
Pt arrived to CT. 

## 2021-05-09 ENCOUNTER — Encounter: Payer: Self-pay | Admitting: Internal Medicine

## 2021-05-09 ENCOUNTER — Other Ambulatory Visit: Payer: Self-pay

## 2021-05-09 ENCOUNTER — Observation Stay (HOSPITAL_BASED_OUTPATIENT_CLINIC_OR_DEPARTMENT_OTHER)
Admit: 2021-05-09 | Discharge: 2021-05-09 | Disposition: A | Discharge status: Home / Self Care | Payer: PPO | Attending: Internal Medicine | Admitting: Internal Medicine

## 2021-05-09 DIAGNOSIS — F0390 Unspecified dementia without behavioral disturbance: Secondary | ICD-10-CM

## 2021-05-09 DIAGNOSIS — G459 Transient cerebral ischemic attack, unspecified: Secondary | ICD-10-CM | POA: Diagnosis not present

## 2021-05-09 DIAGNOSIS — E782 Mixed hyperlipidemia: Secondary | ICD-10-CM

## 2021-05-09 LAB — CBC
HCT: 38.2 % — ABNORMAL LOW (ref 39.0–52.0)
Hemoglobin: 13.2 g/dL (ref 13.0–17.0)
MCH: 31.2 pg (ref 26.0–34.0)
MCHC: 34.6 g/dL (ref 30.0–36.0)
MCV: 90.3 fL (ref 80.0–100.0)
Platelets: 174 10*3/uL (ref 150–400)
RBC: 4.23 MIL/uL (ref 4.22–5.81)
RDW: 12.6 % (ref 11.5–15.5)
WBC: 5.9 10*3/uL (ref 4.0–10.5)
nRBC: 0 % (ref 0.0–0.2)

## 2021-05-09 LAB — ECHOCARDIOGRAM COMPLETE
AR max vel: 2.19 cm2
AV Area VTI: 2.15 cm2
AV Area mean vel: 1.97 cm2
AV Mean grad: 4 mmHg
AV Peak grad: 5.9 mmHg
Ao pk vel: 1.21 m/s
Area-P 1/2: 1.93 cm2
Height: 68 in
MV VTI: 1.75 cm2
S' Lateral: 2.7 cm
Weight: 2627.88 oz

## 2021-05-09 LAB — BASIC METABOLIC PANEL
Anion gap: 6 (ref 5–15)
BUN: 16 mg/dL (ref 8–23)
CO2: 26 mmol/L (ref 22–32)
Calcium: 8.6 mg/dL — ABNORMAL LOW (ref 8.9–10.3)
Chloride: 106 mmol/L (ref 98–111)
Creatinine, Ser: 1.13 mg/dL (ref 0.61–1.24)
GFR, Estimated: >60 mL/min (ref >60)
Glucose, Bld: 110 mg/dL — ABNORMAL HIGH (ref 70–99)
Potassium: 4.1 mmol/L (ref 3.5–5.1)
Sodium: 138 mmol/L (ref 135–145)

## 2021-05-09 LAB — LIPID PANEL
Cholesterol: 114 mg/dL (ref 0–200)
HDL: 37 mg/dL — ABNORMAL LOW (ref >40)
LDL Cholesterol: 62 mg/dL (ref 0–99)
Total CHOL/HDL Ratio: 3.1 RATIO
Triglycerides: 74 mg/dL (ref <150)
VLDL: 15 mg/dL (ref 0–40)

## 2021-05-09 LAB — TSH: TSH: 2.045 u[IU]/mL (ref 0.350–4.500)

## 2021-05-09 LAB — VITAMIN B12: Vitamin B-12: 233 pg/mL (ref 180–914)

## 2021-05-09 MED ORDER — CLOPIDOGREL BISULFATE 75 MG PO TABS: 75.0000 mg | ORAL_TABLET | Freq: Every day | ORAL | 0 refills | Status: AC

## 2021-05-09 NOTE — Discharge Summary (Addendum)
Physician Discharge Summary  Liandro Thelin Kasparian ZOX:[id number] DOB: Dec 31, 1931 DOA: 05/08/2021  PCP: Rusty Aus, MD  Admit date: 05/08/2021 Discharge date: 05/09/2021  Admitted From: home  Disposition:  home   Recommendations for Outpatient Follow-up:  Follow up with PCP in 1-2 weeks F/u w/ [facility] neuro, Dr. Melrose Nakayama, in 1 week for formal neuropsychological testing.  Home Health: no Equipment/Devices:  Discharge Condition: stable  CODE STATUS: full  Diet recommendation: Heart Healthy  Brief/Interim Summary: HPI was taken from Dr. Tobie Poet: Konrad Dolores Denzel Iten is a 87 y.o. male with medical history significant for hyperlipidemia, GERD, history of TIA, dementia, history of UTI and sepsis requiring hopsitalization, generalized weakness, and debility, history of covid infection January 2022, who presents to the emergency department from home for chief concerns of left arm and leg weakness, facial droop, slurred speech.   Spouse at bedside states that at approximately 2 pm/2:15 p.m., she noticed her spouse had slurred speech and then he developed facial droop.  She states that the facial droop lasted approximately less than 15 minutes. The slurred speech/delayed response lasted longer.  She reports that when EMS arrived, patient still had delayed responses and slurred speech.   She further endorses that he has baseline balance difficulty for the last few weeks. He has had to use a cane to walk in the driveway.  She presented to her primary care doctor Dr. Sabra Heck who prescribed patient's on memory pills.   At bedside, Mr. Packett was able to tell me his full name, his age, current location of hospital, and identified his wife by name at bedside.  On physical exam, I can appreciate mild left upper extremity weakness compared to the right.  He had bilateral lower extremity strength is 5/5. There is no facial droop per spouse at bedside.   Spouse at bedside further endorses unintentional weight loss  of about 10-15 pounds since being discharged from the hospital.    Social history: He lives at home with his wife. He denies tobacco, etoh, recreational drug use. 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    Vaccination history: He is vaccinated for covid 19, Pfizer, two doses. He also tested positive for covid 19 in January 2022.   Hospital course from Dr. Jimmye Norman 05/09/21: The neurological work-up for CVA was neg including CT head, MRI brain & MRA brain show no acute intracranial abnormalities but diffuse severe atrophy & chronic microhemorrhages likely secondary chronic hypertensive angiopathy. Neuro recommend aspirin, plavix 3 weeks and then only aspirin. Echo showed EF 60-65%, grade II diastolic dysfunction & no atrial level shunt detected. PT/OT recs home health but pt's home health could not be set up by CM secondary to insurance issue so pt will get PT/OT as an outpatient. For more information, please see previous progress/consult notes.    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 Echo showed EF 60-65%, grade II diastolic dysfunction & no atrial level shunt detected. Continue w/ neuro checks. Will f/u outpatient w/ [facility] neuro for formal  neuropsychological testing, dementia suspected. Neuro following and recs apprec    HLD: continue on statin   Possible dementia: continue on home dose of donepezil. Will f/u outpatient w/ [facility] neuro for formal  neuropsychological testing    Hx of OSA: noncompliant with CPAP. CPAP qhs    GERD: continue on PPI, famotidine    Discharge Instructions  Discharge Instructions     Diet - low sodium heart healthy   Complete by: As directed    Discharge instructions   Complete by: As directed    F/u w/ PCP in 1-2 weeks. F/u w/ [facility] neuro, Dr. Melrose Nakayama, in 1 week   Increase activity slowly   Complete by: As directed       Allergies as of 05/09/2021       Reactions   Augmentin [amoxicillin-pot Clavulanate] Diarrhea   Led to C-diff         Medication List     TAKE these medications    acetaminophen 500 MG tablet Commonly known as: TYLENOL Take 500-1,000 mg by mouth every 6 (six) hours as needed for mild pain.   aspirin EC 81 MG tablet Take 81 mg by mouth in the morning.   bimatoprost 0.01 % Soln Commonly known as: LUMIGAN Place 1 drop into both eyes at bedtime.   clopidogrel 75 MG tablet Commonly known as: Plavix Take 1 tablet (75 mg total) by mouth daily for 21 days.   donepezil 5 MG tablet Commonly known as: ARICEPT Take 5 mg by mouth at bedtime.   famotidine 40 MG tablet Commonly known as: PEPCID Take 40 mg by mouth at bedtime.   pantoprazole 40 MG tablet Commonly known as: PROTONIX Take 40 mg by mouth in the morning.   simvastatin 20 MG tablet Commonly known as: ZOCOR Take 20 mg by mouth at bedtime.   tamsulosin 0.4 MG Caps capsule Commonly known as: FLOMAX Take 0.4 mg by mouth every morning.   Vitamin D3 25 MCG (1000 UT) Caps Take 1,000 Units by mouth in the morning.        Follow-up Information     Anabel Bene, MD Follow up in 1 week(s).   Specialty: Neurology Why: For formal neuropsychological testing. Contact information: [address] [place] [address] [phone]         Rusty Aus, MD Follow up in 2 week(s).   Specialty: Internal Medicine Contact information: [address] [place] [address] [phone]                Allergies  Allergen Reactions   Augmentin [Amoxicillin-Pot Clavulanate] Diarrhea    Led to C-diff     Consultations: Neuro    Procedures/Studies: CT HEAD WO CONTRAST (5MM)  Result Date: 04/23/2021 CLINICAL DATA:  Confusion, disorientation, fatigue, history diabetes mellitus, hypertension, stroke EXAM: CT HEAD WITHOUT CONTRAST TECHNIQUE: Contiguous axial images were obtained from the base of the skull through the vertex without intravenous contrast. Sagittal and coronal MPR images reconstructed from axial data set. COMPARISON:  06/09/2018 FINDINGS: Brain: Generalized atrophy. Normal ventricular morphology. No midline shift or mass effect. Small vessel chronic ischemic changes of deep cerebral white matter. No intracranial hemorrhage, mass lesion, evidence of acute infarction, or extra-axial fluid collection. Vascular: Atherosclerotic calcification of internal carotid and LEFT vertebral arteries at skull base Skull: Intact Sinuses/Orbits: Clear Other: N/A IMPRESSION: Atrophy with small vessel chronic ischemic changes of deep cerebral white matter. No acute intracranial abnormalities. Electronically Signed  By: Lavonia Dana M.D.   On: 04/23/2021 11:51   MR ANGIO HEAD WO CONTRAST  Result Date: 05/08/2021 CLINICAL DATA:  Stroke-like symptoms.  Slurred speech. EXAM: MRI HEAD WITHOUT CONTRAST MRA HEAD WITHOUT CONTRAST TECHNIQUE: Multiplanar, multi-echo pulse sequences of the brain and surrounding structures were acquired without intravenous contrast. Angiographic images of the Circle of Willis were acquired using MRA technique without intravenous contrast. COMPARISON:  No pertinent prior exam. FINDINGS: MRI HEAD FINDINGS Brain: No acute infarct, mass effect or extra-axial collection. Multiple chronic microhemorrhages in a predominantly central distribution. Hyperintense T2-weighted signal is widespread throughout the white matter. Diffuse,  severe atrophy. The midline structures are normal. Vascular: Major flow voids are preserved. Skull and upper cervical spine: Normal calvarium and skull base. Visualized upper cervical spine and soft tissues are normal. Sinuses/Orbits:No paranasal sinus fluid levels or advanced mucosal thickening. No mastoid or middle ear effusion. Normal orbits. MRA HEAD FINDINGS POSTERIOR CIRCULATION: --Vertebral arteries: Normal --Inferior cerebellar arteries: Normal. --Basilar artery: Normal. --Superior cerebellar arteries: Normal. --Posterior cerebral arteries: Normal. ANTERIOR CIRCULATION: --Intracranial internal carotid arteries: Normal. --Anterior cerebral arteries (ACA): Normal. --Middle cerebral arteries (MCA): Normal. ANATOMIC VARIANTS: None IMPRESSION: 1. No acute intracranial abnormality. 2. Diffuse, severe atrophy and chronic small vessel disease. 3. Multiple chronic microhemorrhages in a predominantly central distribution, likely due to chronic hypertensive angiopathy 4. Normal intracranial MRA. Electronically Signed   By: Ulyses Jarred M.D.   On: 05/08/2021 19:44   MR BRAIN WO CONTRAST  Result Date: 05/08/2021 CLINICAL DATA:  Stroke-like symptoms.  Slurred speech. EXAM: MRI HEAD WITHOUT CONTRAST MRA HEAD WITHOUT CONTRAST TECHNIQUE: Multiplanar, multi-echo pulse sequences of the brain and surrounding structures were acquired without intravenous contrast. Angiographic images of the Circle of Willis were acquired using MRA technique without intravenous contrast. COMPARISON:  No pertinent prior exam. FINDINGS: MRI HEAD FINDINGS Brain: No acute infarct, mass effect or extra-axial collection. Multiple chronic microhemorrhages in a predominantly central distribution. Hyperintense T2-weighted signal is widespread throughout the white matter. Diffuse, severe atrophy. The midline structures are normal. Vascular: Major flow voids are preserved. Skull and upper cervical spine: Normal calvarium and skull base. Visualized upper  cervical spine and soft tissues are normal. Sinuses/Orbits:No paranasal sinus fluid levels or advanced mucosal thickening. No mastoid or middle ear effusion. Normal orbits. MRA HEAD FINDINGS POSTERIOR CIRCULATION: --Vertebral arteries: Normal --Inferior cerebellar arteries: Normal. --Basilar artery: Normal. --Superior cerebellar arteries: Normal. --Posterior cerebral arteries: Normal. ANTERIOR CIRCULATION: --Intracranial internal carotid arteries: Normal. --Anterior cerebral arteries (ACA): Normal. --Middle cerebral arteries (MCA): Normal. ANATOMIC VARIANTS: None IMPRESSION: 1. No acute intracranial abnormality. 2. Diffuse, severe atrophy and chronic small vessel disease. 3. Multiple chronic microhemorrhages in a predominantly central distribution, likely due to chronic hypertensive angiopathy 4. Normal intracranial MRA. Electronically Signed   By: Ulyses Jarred M.D.   On: 05/08/2021 19:44   DG Chest Port 1 View  Result Date: 05/08/2021 CLINICAL DATA:  Stroke, altered mental status, history of diabetes, GERD, hypertension EXAM: PORTABLE CHEST 1 VIEW COMPARISON:  CT renal 12/16/2020 FINDINGS: The heart and mediastinal contours are within normal limits. No focal consolidation. Increased interstitial markings consistent with known chronic interstitial lung disease. No pleural effusion. No pneumothorax. No acute osseous abnormality. At least moderate volume hiatal hernia. IMPRESSION: 1. Increased interstitial markings consistent with known chronic interstitial lung disease. Superimposed infection or pulmonary edema not excluded. 2. At least moderate volume hiatal hernia. Electronically Signed   By: Iven Finn M.D.   On: 05/08/2021 17:46   ECHOCARDIOGRAM COMPLETE  Result  Date: 05/09/2021    ECHOCARDIOGRAM REPORT   Patient Name:   SAYER MASINI [facility] Date of Exam: 05/09/2021 Medical Rec #:  [id number]             Height:       68.0 in Accession #:    [id number]            Weight:       164.2 lb Date of Birth:   1931-09-19              BSA:          1.880 m Patient Age:    87 years              BP:           134/87 mmHg Patient Gender: M                     HR:           59 bpm. Exam Location:  [facility] Procedure: 2D Echo, Color Doppler and Cardiac Doppler Indications:     I63.9 Stroke  History:         Patient has prior history of Echocardiogram examinations. Risk                  Factors:Hypertension, Diabetes and Sleep Apnea.  Sonographer:     Charmayne Sheer Referring Phys:  [id number] AMY N COX Diagnosing Phys: Kate Sable MD IMPRESSIONS  1. Left ventricular ejection fraction, by estimation, is 60 to 65%. The left ventricle has normal function. The left ventricle has no regional wall motion abnormalities. Left ventricular diastolic parameters are consistent with Grade II diastolic dysfunction (pseudonormalization).  2. Right ventricular systolic function is normal. The right ventricular size is normal.  3. The mitral valve is degenerative. No evidence of mitral valve regurgitation.  4. The aortic valve is tricuspid. Aortic valve regurgitation is not visualized.  5. The inferior vena cava is normal in size with <50% respiratory variability, suggesting right atrial pressure of 8 mmHg. FINDINGS  Left Ventricle: Left ventricular ejection fraction, by estimation, is 60 to 65%. The left ventricle has normal function. The left ventricle has no regional wall motion abnormalities. The left ventricular internal cavity size was normal in size. There is  no left ventricular hypertrophy. Left ventricular diastolic parameters are consistent with Grade II diastolic dysfunction (pseudonormalization). Right Ventricle: The right ventricular size is normal. No increase in right ventricular wall thickness. Right ventricular systolic function is normal. Left Atrium: Left atrial size was normal in size. Right Atrium: Right atrial size was normal in size. Pericardium: There is no evidence of pericardial effusion. Mitral Valve: The mitral valve is  degenerative in appearance. There is moderate thickening of the mitral valve leaflet(s). No evidence of mitral valve regurgitation. MV peak gradient, 3.5 mmHg. The mean mitral valve gradient is 1.0 mmHg. Tricuspid Valve: The tricuspid valve is normal in structure. Tricuspid valve regurgitation is mild. Aortic Valve: The aortic valve is tricuspid. Aortic valve regurgitation is not visualized. Aortic valve mean gradient measures 4.0 mmHg. Aortic valve peak gradient measures 5.9 mmHg. Aortic valve area, by VTI measures 2.15 cm. Pulmonic Valve: The pulmonic valve was not well visualized. Pulmonic valve regurgitation is mild to moderate. Aorta: The aortic root and ascending aorta are structurally normal, with no evidence of dilitation. Venous: The inferior vena cava is normal in size with less than 50% respiratory variability, suggesting right atrial pressure of 8 mmHg. IAS/Shunts: No atrial  level shunt detected by color flow Doppler.  LEFT VENTRICLE PLAX 2D LVIDd:         3.90 cm   Diastology LVIDs:         2.70 cm   LV e' lateral:   3.81 cm/s LV PW:         0.90 cm   LV E/e' lateral: 16.5 LV IVS:        0.80 cm LVOT diam:     1.90 cm LV SV:         56 LV SV Index:   30 LVOT Area:     2.84 cm  RIGHT VENTRICLE RV Basal diam:  3.30 cm LEFT ATRIUM             Index        RIGHT ATRIUM           Index LA diam:        3.10 cm 1.65 cm/m   RA Area:     10.10 cm LA Vol (A2C):   40.0 ml 21.28 ml/m  RA Volume:   18.90 ml  10.06 ml/m LA Vol (A4C):   33.2 ml 17.66 ml/m LA Biplane Vol: 40.0 ml 21.28 ml/m  AORTIC VALVE                    PULMONIC VALVE AV Area (Vmax):    2.19 cm     PV Vmax:       0.94 m/s AV Area (Vmean):   1.97 cm     PV Vmean:      65.900 cm/s AV Area (VTI):     2.15 cm     PV VTI:        0.219 m AV Vmax:           121.00 cm/s  PV Peak grad:  3.5 mmHg AV Vmean:          89.100 cm/s  PV Mean grad:  2.0 mmHg AV VTI:            0.258 m AV Peak Grad:      5.9 mmHg AV Mean Grad:      4.0 mmHg LVOT Vmax:          93.30 cm/s LVOT Vmean:        61.800 cm/s LVOT VTI:          0.196 m LVOT/AV VTI ratio: 0.76  AORTA Ao Root diam: 3.50 cm MITRAL VALVE               TRICUSPID VALVE MV Area (PHT): 1.93 cm    TR Peak grad:   31.4 mmHg MV Area VTI:   1.75 cm    TR Vmax:        280.00 cm/s MV Peak grad:  3.5 mmHg MV Mean grad:  1.0 mmHg    SHUNTS MV Vmax:       0.93 m/s    Systemic VTI:  0.20 m MV Vmean:      44.4 cm/s   Systemic Diam: 1.90 cm MV Decel Time: 394 msec MV E velocity: 63.00 cm/s MV A velocity: 71.60 cm/s MV E/A ratio:  0.88 Kate Sable MD Electronically signed by Kate Sable MD Signature Date/Time: 05/09/2021/12:08:10 PM    Final    CT HEAD CODE STROKE WO CONTRAST  Result Date: 05/08/2021 CLINICAL DATA:  Code stroke. Neuro deficit, acute, stroke suspected. Additional history provided: Slurred speech, left-sided droop, last known well 2:35 p.m. EXAM:  CT HEAD WITHOUT CONTRAST TECHNIQUE: Contiguous axial images were obtained from the base of the skull through the vertex without intravenous contrast. COMPARISON:  Head CT 04/23/2021. brain MRI 06/09/2018. FINDINGS: Brain: Mild generalized cerebral and cerebellar atrophy. Moderate/advanced patchy and ill-defined hypoattenuation within the cerebral white matter, nonspecific but compatible with chronic small vessel ischemic disease. Prominent perivascular space versus chronic lacunar infarct within the left thalamus, unchanged. There is no acute intracranial hemorrhage. No demarcated cortical infarct. No extra-axial fluid collection. No evidence of an intracranial mass. No midline shift. Vascular: No hyperdense vessel.  Atherosclerotic calcifications. Skull: Normal. Negative for fracture or focal lesion. Sinuses/Orbits: Visualized orbits show no acute finding. Mild mucosal thickening within the left maxillary sinus at the imaged levels. Mild mucosal thickening within the bilateral ethmoid sinuses. Redemonstrated chronic deformity of the outer table of the  right frontal sinus. ASPECTS ([place] Stroke Program Early CT Score) - Ganglionic level infarction (caudate, lentiform nuclei, internal capsule, insula, M1-M3 cortex): 7 - Supraganglionic infarction (M4-M6 cortex): 3 Total score (0-10 with 10 being normal): 10 These results were communicated to Dr. Rory Percy at 4:22 pmon 10/8/2022by text page via the [facility] messaging system. IMPRESSION: No evidence of acute intracranial abnormality.  ASPECTS is 10. Moderate/severe chronic small vessel ischemic changes within the cerebral white matter. Prominent perivascular space versus chronic lacunar infarct within the left thalamus, unchanged. Mild generalized parenchymal atrophy. Paranasal sinus disease, as described. Electronically Signed   By: Kellie Simmering D.O.   On: 05/08/2021 16:22   (Echo, Carotid, EGD, Colonoscopy, ERCP)    Subjective: Pt denies any complaints    Discharge Exam: Vitals:   05/09/21 0519 05/09/21 0727  BP: 122/73 134/87  Pulse: (!) 58 (!) 57  Resp: 20 18  Temp: 98.2 F (36.8 C) 98.2 F (36.8 C)  SpO2: 98% 92%   Vitals:   05/08/21 2349 05/09/21 0041 05/09/21 0519 05/09/21 0727  BP:  129/64 122/73 134/87  Pulse:  (!) 50 (!) 58 (!) 57  Resp: 19 20 20 18   Temp:  98.2 F (36.8 C) 98.2 F (36.8 C) 98.2 F (36.8 C)  TempSrc:  Oral Oral Oral  SpO2:  100% 98% 92%  Weight:      Height:        General: Pt is alert, awake, not in acute distress Cardiovascular: S1/S2 +, no rubs, no gallops Respiratory: CTA bilaterally, no wheezing, no rhonchi Abdominal: Soft, NT, ND, bowel sounds + Extremities: no edema, no cyanosis    The results of significant diagnostics from this hospitalization (including imaging, microbiology, ancillary and laboratory) are listed below for reference.     Microbiology: Recent Results (from the past 240 hour(s))  Resp Panel by RT-PCR (Flu A&B, Covid) Nasopharyngeal Swab     Status: None   Collection Time: 05/08/21  4:11 PM   Specimen: Nasopharyngeal Swab;  Nasopharyngeal(NP) swabs in vial transport medium  Result Value Ref Range Status   SARS Coronavirus 2 by RT PCR NEGATIVE NEGATIVE Final    Comment: (NOTE) SARS-CoV-2 target nucleic acids are NOT DETECTED.  The SARS-CoV-2 RNA is generally detectable in upper respiratory specimens during the acute phase of infection. The lowest concentration of SARS-CoV-2 viral copies this assay can detect is 138 copies/mL. A negative result does not preclude SARS-Cov-2 infection and should not be used as the sole basis for treatment or other patient management decisions. A negative result may occur with  improper specimen collection/handling, submission of specimen other than nasopharyngeal swab, presence of viral mutation(s) within the areas  targeted by this assay, and inadequate number of viral copies(<138 copies/mL). A negative result must be combined with clinical observations, patient history, and epidemiological information. The expected result is Negative.  Fact Sheet for Patients:  EntrepreneurPulse.com.au  Fact Sheet for Healthcare Providers:  IncredibleEmployment.be  This test is no t yet approved or cleared by the [place] FDA and  has been authorized for detection and/or diagnosis of SARS-CoV-2 by FDA under an Emergency Use Authorization (EUA). This EUA will remain  in effect (meaning this test can be used) for the duration of the COVID-19 declaration under Section 564(b)(1) of the Act, 21 U.S.C.section 360bbb-3(b)(1), unless the authorization is terminated  or revoked sooner.       Influenza A by PCR NEGATIVE NEGATIVE Final   Influenza B by PCR NEGATIVE NEGATIVE Final    Comment: (NOTE) The Xpert Xpress SARS-CoV-2/FLU/RSV plus assay is intended as an aid in the diagnosis of influenza from Nasopharyngeal swab specimens and should not be used as a sole basis for treatment. Nasal washings and aspirates are unacceptable for Xpert Xpress  SARS-CoV-2/FLU/RSV testing.  Fact Sheet for Patients: EntrepreneurPulse.com.au  Fact Sheet for Healthcare Providers: IncredibleEmployment.be  This test is not yet approved or cleared by the [place] FDA and has been authorized for detection and/or diagnosis of SARS-CoV-2 by FDA under an Emergency Use Authorization (EUA). This EUA will remain in effect (meaning this test can be used) for the duration of the COVID-19 declaration under Section 564(b)(1) of the Act, 21 U.S.C. section 360bbb-3(b)(1), unless the authorization is terminated or revoked.  Performed at [facility], [address] [place], [place] [address]      Labs: BNP (last 3 results) No results for input(s): BNP in the last 8760 hours. Basic Metabolic Panel: Recent Labs  Lab 05/08/21 1610 05/09/21 0505  NA 136 138  K 4.1 4.1  CL 102 106  CO2 27 26  GLUCOSE 190* 110*  BUN 19 16  CREATININE 1.21 1.13  CALCIUM 8.8* 8.6*   Liver Function Tests: Recent Labs  Lab 05/08/21 1610  AST 21  ALT 27  ALKPHOS 50  BILITOT 0.8  PROT 7.4  ALBUMIN 3.6   No results for input(s): LIPASE, AMYLASE in the last 168 hours. No results for input(s): AMMONIA in the last 168 hours. CBC: Recent Labs  Lab 05/08/21 1610 05/09/21 0505  WBC 6.2 5.9  NEUTROABS 3.1  --   HGB 13.7 13.2  HCT 38.8* 38.2*  MCV 92.8 90.3  PLT 177 174   Cardiac Enzymes: No results for input(s): CKTOTAL, CKMB, CKMBINDEX, TROPONINI in the last 168 hours. BNP: Invalid input(s): POCBNP CBG: Recent Labs  Lab 05/08/21 1553  GLUCAP 203*   D-Dimer No results for input(s): DDIMER in the last 72 hours. Hgb A1c No results for input(s): HGBA1C in the last 72 hours. Lipid Profile Recent Labs    05/09/21 0505  CHOL 114  HDL 37*  LDLCALC 62  TRIG 74  CHOLHDL 3.1   Thyroid function studies Recent Labs    05/09/21 1000  TSH 2.045   Anemia work up No results for input(s): VITAMINB12,  FOLATE, FERRITIN, TIBC, IRON, RETICCTPCT in the last 72 hours. Urinalysis    Component Value Date/Time   COLORURINE YELLOW (A) 12/16/2020 1805   APPEARANCEUR Cloudy (A) 01/08/2021 0759   LABSPEC 1.010 12/16/2020 1805   PHURINE 6.0 12/16/2020 1805   GLUCOSEU Negative 01/08/2021 0759   HGBUR MODERATE (A) 12/16/2020 1805   BILIRUBINUR Negative 01/08/2021 0759  KETONESUR NEGATIVE 12/16/2020 1805   PROTEINUR 2+ (A) 01/08/2021 0759   PROTEINUR NEGATIVE 12/16/2020 1805   NITRITE Negative 01/08/2021 0759   NITRITE NEGATIVE 12/16/2020 1805   LEUKOCYTESUR 1+ (A) 01/08/2021 0759   LEUKOCYTESUR MODERATE (A) 12/16/2020 1805   Sepsis Labs Invalid input(s): PROCALCITONIN,  WBC,  LACTICIDVEN Microbiology Recent Results (from the past 240 hour(s))  Resp Panel by RT-PCR (Flu A&B, Covid) Nasopharyngeal Swab     Status: None   Collection Time: 05/08/21  4:11 PM   Specimen: Nasopharyngeal Swab; Nasopharyngeal(NP) swabs in vial transport medium  Result Value Ref Range Status   SARS Coronavirus 2 by RT PCR NEGATIVE NEGATIVE Final    Comment: (NOTE) SARS-CoV-2 target nucleic acids are NOT DETECTED.  The SARS-CoV-2 RNA is generally detectable in upper respiratory specimens during the acute phase of infection. The lowest concentration of SARS-CoV-2 viral copies this assay can detect is 138 copies/mL. A negative result does not preclude SARS-Cov-2 infection and should not be used as the sole basis for treatment or other patient management decisions. A negative result may occur with  improper specimen collection/handling, submission of specimen other than nasopharyngeal swab, presence of viral mutation(s) within the areas targeted by this assay, and inadequate number of viral copies(<138 copies/mL). A negative result must be combined with clinical observations, patient history, and epidemiological information. The expected result is Negative.  Fact Sheet for Patients:   EntrepreneurPulse.com.au  Fact Sheet for Healthcare Providers:  IncredibleEmployment.be  This test is no t yet approved or cleared by the [place] FDA and  has been authorized for detection and/or diagnosis of SARS-CoV-2 by FDA under an Emergency Use Authorization (EUA). This EUA will remain  in effect (meaning this test can be used) for the duration of the COVID-19 declaration under Section 564(b)(1) of the Act, 21 U.S.C.section 360bbb-3(b)(1), unless the authorization is terminated  or revoked sooner.       Influenza A by PCR NEGATIVE NEGATIVE Final   Influenza B by PCR NEGATIVE NEGATIVE Final    Comment: (NOTE) The Xpert Xpress SARS-CoV-2/FLU/RSV plus assay is intended as an aid in the diagnosis of influenza from Nasopharyngeal swab specimens and should not be used as a sole basis for treatment. Nasal washings and aspirates are unacceptable for Xpert Xpress SARS-CoV-2/FLU/RSV testing.  Fact Sheet for Patients: EntrepreneurPulse.com.au  Fact Sheet for Healthcare Providers: IncredibleEmployment.be  This test is not yet approved or cleared by the [place] FDA and has been authorized for detection and/or diagnosis of SARS-CoV-2 by FDA under an Emergency Use Authorization (EUA). This EUA will remain in effect (meaning this test can be used) for the duration of the COVID-19 declaration under Section 564(b)(1) of the Act, 21 U.S.C. section 360bbb-3(b)(1), unless the authorization is terminated or revoked.  Performed at [facility], [address], [place], [place] [address]      Time coordinating discharge: Over 30 minutes  SIGNED:   Wyvonnia Dusky, MD  [facility] 05/09/2021, 12:36 PM Pager   If 7PM-7AM, please contact night-coverage

## 2021-05-09 NOTE — TOC Transition Note (Signed)
Transition of Care [facility]) - CM/SW Discharge Note   Patient Details  Name: David Myers MRN: [id number] Date of Birth: [birth year]  [facility]) CM/SW Contact:  Harriet Masson, RN Phone Number: 05/09/2021, 12:42 PM   Clinical Narrative:    Spoke with spouse in pt's room concerning recommendations for HHPT/OT. No preference as RN contacted several [facility] that were not able to accommodate pt's care. Other option discussed for outpt therapy as wife and pt agreed. RN arranged outpt therapy at the [facility] location and faxed referral sheet to the agency. Team updated accordingly. No additional needs at this time.  Wife states her daughter and grandson live with them in their single family home. And the wife is able to assist pt with all his ADL, transportation and medications. Pt only uses a cane with no other DME requested at this time.   Pt will be discharged today with outpt therapy. No other needs presented.   Final next level of care: OP Rehab (Oupt PT/OT) Barriers to Discharge: Barriers Resolved   Patient Goals and CMS Choice        Discharge Placement                  Name of family member notified: David Myers Patient and family notified of of transfer: 05/09/21  Discharge Plan and Services                                     Social Determinants of Health (SDOH) Interventions     Readmission Risk Interventions No flowsheet data found.

## 2021-05-09 NOTE — Evaluation (Signed)
Occupational Therapy Evaluation Patient Details Name: David Myers MRN: [id number] DOB: May 14, 1932 Today's Date: 05/09/2021   History of Present Illness Patient is a 87 y.o. male with medical history significant for hyperlipidemia, GERD, history of TIA, dementia, history of UTI and sepsis requiring hopsitalization, generalized weakness, and debility, history of covid infection January 2022, who presents to the emergency department from home for chief concerns of left arm and leg weakness, facial droop, slurred speech. MRI of brain with no acute intracranial abnormality and multiple chronic microhemorrhages in a predominantly central distribution, likely due to chronic hypertensive angiopathy   Clinical Impression   Pt seen for OT evaluation this date. Prior to admission, pt was independent in all ADLs and functional mobility, living in a multi-story home with wife and adult children. Pt's spouse reports that patient was instructed by his PCP to use a cane when walking outside, however patient reports he prefers not to use the cane. Pt and spouse report that pt has had 1 fall (during toilet transfer) within the past 6 months. Pt does not use supplemental O2 at baseline. Pt currently presents with impaired balance and requires SUPERVISION for toilet transfers, SUPERVISION for standing grooming tasks, and SUPERVISION for functional mobility of short household distances without AD. No sensory, coordination, or visual impairments observed during evaluation. Pt would benefit from additional skilled OT services to maximize return to PLOF and minimize risk of future falls, injury, caregiver burden, and readmission. Upon discharge, recommend [facility] services.         Recommendations for follow up therapy are one component of a multi-disciplinary discharge planning process, led by the attending physician.  Recommendations may be updated based on patient status, additional functional criteria and insurance  authorization.   Follow Up Recommendations  Home health OT;Supervision - Intermittent    Equipment Recommendations  None recommended by OT       Precautions / Restrictions Precautions Precautions: Fall Restrictions Weight Bearing Restrictions: No      Mobility Bed Mobility Overal bed mobility: Modified Independent             General bed mobility comments: with HOB elevated, able to perform with ease    Transfers Overall transfer level: Needs assistance Equipment used: None Transfers: Sit to/from Stand Sit to Stand: Supervision         General transfer comment: supervision for safety with standing from bed and from toilet without assistive device.    Balance Overall balance assessment: History of Falls;Needs assistance Sitting-balance support: No upper extremity supported;Feet supported Sitting balance-Leahy Scale: Good Sitting balance - Comments: Good sitting balance reaching within BOS at EOB   Standing balance support: No upper extremity supported;During functional activity Standing balance-Leahy Scale: Fair Standing balance comment: Supervision during hand washing at sink                           ADL either performed or assessed with clinical judgement   ADL Overall ADL's : Needs assistance/impaired     Grooming: Wash/dry hands;Supervision/safety;Standing               Lower Body Dressing: Modified independent;Bed level Lower Body Dressing Details (indicate cue type and reason): to don/doff socks and SCD Toilet Transfer: Supervision/safety;BSC;Ambulation           Functional mobility during ADLs: Supervision/safety       Vision Baseline Vision/History: 1 Wears glasses (reading glasses) Patient Visual Report: No change from baseline Vision Assessment?: No apparent  visual deficits            Pertinent Vitals/Pain Pain Assessment: No/denies pain     Hand Dominance Right   Extremity/Trunk Assessment Upper Extremity  Assessment Upper Extremity Assessment: Overall WFL for tasks assessed (shoulder flexion RUE 4/5 and LUE 4-/5. 5/5 in all distal joints)   Lower Extremity Assessment Lower Extremity Assessment: Overall WFL for tasks assessed   Cervical / Trunk Assessment Cervical / Trunk Assessment: Normal   Communication Communication Communication: HOH   Cognition Arousal/Alertness: Awake/alert Behavior During Therapy: WFL for tasks assessed/performed Overall Cognitive Status: History of cognitive impairments - at baseline                                 General Comments: patient is oriented to person, place, situation. patient is able to follow single step commands consistently   General Comments  heart rate in the 50's at rest and 70's with activity.    Exercises Other Exercises Other Exercises: Educated on role of OT in acute care setting, POC, and d/c recommendations, with pt and spouse verbalizing understanding        Home Living Family/patient expects to be discharged to:: Private residence Living Arrangements: Spouse/significant other;Children Available Help at Discharge: Family;Available 24 hours/day Type of Home: House Home Access: Stairs to enter [employer] of Steps: 4 Entrance Stairs-Rails: Left;Right Home Layout: Multi-level;Able to live on main level with bedroom/bathroom     Bathroom Shower/Tub: Tub/shower unit;Walk-in shower   Bathroom Toilet: Handicapped height     Home Equipment: Grab bars - tub/shower;Shower seat - built in;Cane - single point;Bedside commode          Prior Functioning/Environment Level of Independence: Independent with assistive device(s)        Comments: spouse reports patient was instructed by his doctor to use a cane with ambulation outside. patient reports he prefers not to use the cane. independent with ADLs at baseline. 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 spouse reports memory and balance issues for the past  few weeks with only one fall reported in the past 6 months        OT Problem List: Impaired balance (sitting and/or standing);Decreased knowledge of use of DME or AE      OT Treatment/Interventions: Self-care/ADL training;Therapeutic exercise;DME and/or AE instruction;Therapeutic activities;Patient/family education;Balance training    OT Goals(Current goals can be found in the care plan section) Acute Rehab OT Goals Patient Stated Goal: to go home OT Goal Formulation: With patient Time For Goal Achievement: 05/23/21 Potential to Achieve Goals: Good ADL Goals Pt Will Perform Grooming: with modified independence;standing Pt Will Perform Lower Body Bathing: with modified independence;sit to/from stand Pt Will Transfer to Toilet: with modified independence;ambulating;regular height toilet;grab bars  OT Frequency: Min 1X/week    AM-PAC OT "6 Clicks" Daily Activity     Outcome Measure Help from another person eating meals?: None Help from another person taking care of personal grooming?: A Little Help from another person toileting, which includes using toliet, bedpan, or urinal?: A Little Help from another person bathing (including washing, rinsing, drying)?: A Little Help from another person to put on and taking off regular upper body clothing?: None Help from another person to put on and taking off regular lower body clothing?: A Little 6 Click Score: 20   End of Session Nurse Communication: Mobility status  Activity Tolerance: Patient tolerated treatment well Patient left: in bed;with call bell/phone within  reach;with bed alarm set;with family/visitor present  OT Visit Diagnosis: Unsteadiness on feet (R26.81);History of falling (Z91.81)                Time: 8182-9937 OT Time Calculation (min): 31 min Charges:  OT General Charges $OT Visit: 1 Visit OT Evaluation $OT Eval Moderate Complexity: 1 Mod OT Treatments $Self Care/Home Management : 8-22 mins  Fredirick Maudlin,  OTR/L [place]

## 2021-05-09 NOTE — Plan of Care (Addendum)
Handoff report received from ED. A/O x 3 with intermittent confusion. On 1 l/[place] @ hs with even and unlabored breathing. 4Ps and plan of care discussed. Denies any discomfort at this time. Resting well with eyes closed, call light within reach. Bed alarm on. Continue poc/ hourly rounding. Patient asymptomatic and alert but HR fluctuates from 53 50 46 then back to SR. MD Mansy made aware. Problem: Education: Goal: Knowledge of General Education information will improve Description: Including pain rating scale, medication(s)/side effects and non-pharmacologic comfort measures Outcome: Progressing   Problem: Health Behavior/Discharge Planning: Goal: Ability to manage health-related needs will improve Outcome: Progressing   Problem: Clinical Measurements: Goal: Ability to maintain clinical measurements within normal limits will improve Outcome: Progressing Goal: Will remain free from infection Outcome: Progressing Goal: Diagnostic test results will improve Outcome: Progressing Goal: Respiratory complications will improve Outcome: Progressing Goal: Cardiovascular complication will be avoided Outcome: Progressing   Problem: Activity: Goal: Risk for activity intolerance will decrease Outcome: Progressing   Problem: Nutrition: Goal: Adequate nutrition will be maintained Outcome: Progressing   Problem: Coping: Goal: Level of anxiety will decrease Outcome: Progressing   Problem: Elimination: Goal: Will not experience complications related to bowel motility Outcome: Progressing Goal: Will not experience complications related to urinary retention Outcome: Progressing   Problem: Pain Managment: Goal: General experience of comfort will improve Outcome: Progressing   Problem: Safety: Goal: Ability to remain free from injury will improve Outcome: Progressing   Problem: Skin Integrity: Goal: Risk for impaired skin integrity will decrease Outcome: Progressing

## 2021-05-09 NOTE — Progress Notes (Addendum)
Neurology Progress Note   S:// Met with wife at bedside. Saw and evaluated the patient this morning. She reports that he had more of right-sided facial droop than left-could not be sure because it all happened so fast. 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  She also thinks that he is having some changes of dementia and his primary care agrees and started him on low-dose medications for it. Seems to be back to his baseline for now. Is awaiting hearing aids for which he was tested few days ago.   O:// Current vital signs: BP 134/87 (BP Location: Right Arm)   Pulse (!) 57   Temp 98.2 F (36.8 C) (Oral)   Resp 18   Ht $R'5\' 8"'eP$  (1.727 m)   Wt 74.5 kg   SpO2 92%   BMI 24.97 kg/m  Vital signs in last 24 hours: Temp:  [97.5 F (36.4 C)-98.7 F (37.1 C)] 98.2 F (36.8 C) (10/09 0727) Pulse Rate:  [50-100] 57 (10/09 0727) Resp:  [14-20] 18 (10/09 0727) BP: (122-159)/(64-93) 134/87 (10/09 0727) SpO2:  [92 %-100 %] 92 % (10/09 0727) Weight:  [74.5 kg] 74.5 kg (10/08 2030) General: Awake alert in no distress HEENT: Normocephalic, atraumatic Lungs: Clear to auscultation Cardiovascular: Regular rate rhythm Abdomen nondistended nontender Extremities are warm well perfused Neurological exam He is awake alert oriented x3 Speech is not dysarthric but he is slow to respond to questions. No evidence of aphasia Cranial nerves II to XII intact Motor examination with no drift in any of the 4 extremities Sensation intact light touch without extinction Coordination examination with no dysmetria noted Gait is cautious with small steps that he took with the physical therapist. NIH stroke scale remains 0  Medications  Current Facility-Administered Medications:     stroke: mapping our early stages of recovery book, , Does not apply, Once, Cox, Amy N, DO    acetaminophen (TYLENOL) tablet 650 mg, 650 mg, Oral, Q4H PRN **OR** acetaminophen (TYLENOL) 160 MG/5ML solution 650 mg, 650 mg, Per Tube, Q4H PRN **OR** acetaminophen (TYLENOL) suppository 650 mg, 650 mg, Rectal, Q4H PRN, Cox, Amy N, DO   aspirin EC tablet 81 mg, 81 mg, Oral, Daily, Cox, Amy N, DO, 81 mg at 05/09/21 0854   clopidogrel (PLAVIX) tablet 75 mg, 75 mg, Oral, Daily, Cox, Amy N, DO, 75 mg at 05/09/21 0853   donepezil (ARICEPT) tablet 5 mg, 5 mg, Oral, QHS, Cox, Amy N, DO, 5 mg at 05/08/21 2148   famotidine (PEPCID) tablet 40 mg, 40 mg, Oral, QHS, Cox, Amy N, DO, 40 mg at 05/08/21 2148   hydrALAZINE (APRESOLINE) injection 5 mg, 5 mg, Intravenous, Q6H PRN, Cox, Amy N, DO   LORazepam (ATIVAN) injection 1 mg, 1 mg, Intravenous, Once PRN, Cox, Amy N, DO   pantoprazole (PROTONIX) EC tablet 40 mg, 40 mg, Oral, q AM, Cox, Amy N, DO, 40 mg at 05/09/21 0854   simvastatin (ZOCOR) tablet 20 mg, 20 mg, Oral, QHS, Cox, Amy N, DO, 20 mg at 05/08/21 2148 Labs CBC    Component Value Date/Time   WBC 5.9 05/09/2021 0505   RBC 4.23 05/09/2021 0505   HGB 13.2 05/09/2021 0505   HCT 38.2 (L) 05/09/2021 0505   PLT 174 05/09/2021 0505   MCV 90.3 05/09/2021 0505   MCH 31.2 05/09/2021 0505   MCHC 34.6 05/09/2021 0505   RDW 12.6 05/09/2021 0505  LYMPHSABS 2.0 05/08/2021 1610   MONOABS 0.5 05/08/2021 1610   EOSABS 0.4 05/08/2021 1610   BASOSABS 0.0 05/08/2021 1610    CMP     Component Value Date/Time   NA 138 05/09/2021 0505   K 4.1 05/09/2021 0505   CL 106 05/09/2021 0505   CO2 26 05/09/2021 0505   GLUCOSE 110 (H) 05/09/2021 0505   BUN 16 05/09/2021 0505   CREATININE 1.13 05/09/2021 0505   CREATININE 1.25 01/07/2013 0840   CALCIUM 8.6 (L) 05/09/2021 0505   PROT 7.4 05/08/2021 1610   ALBUMIN 3.6 05/08/2021 1610   AST 21 05/08/2021 1610   ALT 27 05/08/2021 1610   ALKPHOS 50 05/08/2021 1610   BILITOT 0.8 05/08/2021 1610   GFRNONAA >60 05/09/2021 0505   GFRNONAA 54 (L) 01/07/2013 0840    GFRAA >60 06/09/2018 1453   GFRAA >60 01/07/2013 0840    glycosylated hemoglobin-pending  Lipid Panel     Component Value Date/Time   CHOL 114 05/09/2021 0505   TRIG 74 05/09/2021 0505   HDL 37 (L) 05/09/2021 0505   CHOLHDL 3.1 05/09/2021 0505   VLDL 15 05/09/2021 0505   LDLCALC 62 05/09/2021 0505     Imaging I have reviewed images in epic and the results pertinent to this consultation are: CT head unremarkable MRI brain with no acute stroke MRA of the head unremarkable. Carotid Dopplers ordered and pending.   Assessment: 87 year old with past medical history of diabetes, hypertension, sleep apnea, prior stroke/TIA without residual deficits brought in for evaluation of sudden onset of initially left-sided facial droop, arm and leg weakness but wife says it might have been right facial droop-symptoms resolved by the time he arrived to the ER and was not given IV thrombolysis. Due to the focality of symptoms and risk factors, admitted for TIA risk factor work-up. MRI brain negative for stroke Intracranial vasculature unremarkable Neck vasculature evaluation with carotid Dopplers pending at this time LDL at goal. Also has somewhat progressive cognitive deficits-concern for underlying dementia   Impression:  TIA Outpatient work-up for dementia  Recommendations: -Continue frequent neurochecks and -Continue telemetry -Lipid panel reveals LDL less than 70, which is at goal.  Continue home statin. -For stroke prevention-aspirin 81 and Plavix 75 for 3 weeks followed by aspirin only. -2D echo pending.  If it shows any evidence of left atrial enlargement, might want to consider long-term cardiac monitoring outpatient for underlying paroxysmal atrial fibrillation. -PT recommends home health PT. -A1c-pending-goal A1c less than 7.  Management per primary hospitalist-outpatient primary care service. -Outpatient neurology follow-up for TIA as well as for formal neuropsychological  testing. -Check B12 (replete if less than 400), TSH, RPR as a part of dementia w/u Patient and family would like to follow up at [facility] neurology-I have requested the primary hospitalist to provide a referral upon discharge.  Discussed my plan in detail with primary hospitalist Dr. Jimmye Norman  Please call inpatient neurology with questions as needed. -- Amie Portland, MD Neurologist [facility]  [place] Pager: [phone]    ADDENDUM  2D echo completed and report reviewed Normal LVEF.  No regional wall motion abnormalities.  Grade 2 diastolic dysfunction.  LA size normal.  No gross abnormalities of the mitral or aortic valve.  Tele with no evidence of Afib. No subjective complaints pertaining to dysrhythmia.   Do not see a need for outpatient cardiac monitoring at this time.  TSH 2.045 B12 pending A1c pending   Plan  as above.  Follow up with  outpatient neurology in 8 to 12 weeks after discharge  -- Amie Portland, MD Neurologist [place] Pager: [phone]

## 2021-05-09 NOTE — Evaluation (Signed)
Physical Therapy Evaluation Patient Details Name: David Myers MRN: [id number] DOB: 11/25/1931 Today's Date: 05/09/2021  History of Present Illness  Patient is a 87 y.o. male with medical history significant for hyperlipidemia, GERD, history of TIA, dementia, history of UTI and sepsis requiring hopsitalization, generalized weakness, and debility, history of covid infection January 2022, who presents to the emergency department from home for chief concerns of left arm and leg weakness, facial droop, slurred speech. MRI of brain with no acute intracranial abnormality and multiple chronic microhemorrhages in a predominantly central distribution, likely due to chronic hypertensive angiopathy  Clinical Impression  Patient agreeable to PT. Spouse at the bedside during evaluation. Patient uses a cane with ambulation outside the house and is independent with ADLs. Spouse reports patient has had memory and balance issues for several weeks at home with one fall reported in the past 6 months.  No focal weakness noted during evaluation. Patient is Mod I for bed mobility and walked 22ft in the room without assistive device. Patient has occasional unsteadiness and required occasional cues for safety with ambulation. Recommend to keep using cane at home for balance and fall prevention with ambulation. Patient is likely at or nearing baseline level of functional mobility. Recommend HHPT follow up at discharge and continued PT to maximize independence and decrease risk for falls.       Recommendations for follow up therapy are one component of a multi-disciplinary discharge planning process, led by the attending physician.  Recommendations may be updated based on patient status, additional functional criteria and insurance authorization.  Follow Up Recommendations Home health PT    Equipment Recommendations  None recommended by PT    Recommendations for Other Services       Precautions / Restrictions  Precautions Precautions: Fall Restrictions Weight Bearing Restrictions: No      Mobility  Bed Mobility Overal bed mobility: Modified Independent             General bed mobility comments: supine to and from short sitting with head of bed elevated slightly    Transfers Overall transfer level: Needs assistance Equipment used: None Transfers: Sit to/from Stand Sit to Stand: Supervision         General transfer comment: supervision for safety with standing from bed and from toilet without assistive device.  Ambulation/Gait Ambulation/Gait assistance: Min guard;Supervision Gait Distance (Feet): 75 Feet Assistive device: None Gait Pattern/deviations: Step-through pattern Gait velocity: decreased   General Gait Details: occasional unsteadiness with ambulation that is self corrected. Min guard progressing to supervision with increased ambulation distance without assistive device. recommend to continue to use cane for ambulation for safety and falla prevention  Stairs            Wheelchair Mobility    Modified Rankin (Stroke Patients Only)       Balance Overall balance assessment: History of Falls;Needs assistance Sitting-balance support: Feet supported Sitting balance-Leahy Scale: Good     Standing balance support: No upper extremity supported;During functional activity Standing balance-Leahy Scale: Fair Standing balance comment: close stand by assistance with hand washing at sink                             Pertinent Vitals/Pain Pain Assessment: No/denies pain    Home Living Family/patient expects to be discharged to:: Private residence Living Arrangements: Spouse/significant other Available Help at Discharge: Family;Available 24 hours/day Type of Home: [place]  Equipment: Kasandra Knudsen - single point      Prior Function Level of Independence: Independent with assistive device(s)         Comments: spouse reports patient was  instructed by his doctor to use a cane with ambulation outside. patient reports he prefers not to use the cane. independent with ADLs per spouse. 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 spouse reports memory and balance issues for the past few weeks with only one fall reported in the past 6 months     Hand Dominance        Extremity/Trunk Assessment   Upper Extremity Assessment Upper Extremity Assessment: Overall WFL for tasks assessed    Lower Extremity Assessment Lower Extremity Assessment: Overall WFL for tasks assessed (light touch sensation appears intact. no focal weakness noted on exam)       Communication   Communication: No difficulties  Cognition Arousal/Alertness: Awake/alert Behavior During Therapy: WFL for tasks assessed/performed Overall Cognitive Status: History of cognitive impairments - at baseline                                 General Comments: patient is oriented to person, place, situation. some confusion about recent events. patient is able to follow single step commands consistently      General Comments General comments (skin integrity, edema, etc.): heart rate in the 50's at rest and 70's with activity. occasional cues for safety with mobility due to unsteadiness    Exercises     Assessment/Plan    PT Assessment Patient needs continued PT services  PT Problem List Decreased activity tolerance;Decreased mobility;Decreased cognition;Decreased safety awareness       PT Treatment Interventions DME instruction;Gait training;Stair training;Functional mobility training;Therapeutic activities;Therapeutic exercise;Balance training;Neuromuscular re-education;Cognitive remediation;Patient/family education    PT Goals (Current goals can be found in the Care Plan section)  Acute Rehab PT Goals Patient Stated Goal: to go home PT Goal Formulation: With patient/family Time For Goal Achievement: 05/23/21 Potential to Achieve Goals: Good     Frequency Min 2X/week   Barriers to discharge        Co-evaluation               AM-PAC PT "6 Clicks" Mobility  Outcome Measure Help needed turning from your back to your side while in a flat bed without using bedrails?: None Help needed moving from lying on your back to sitting on the side of a flat bed without using bedrails?: None Help needed moving to and from a bed to a chair (including a wheelchair)?: A Little Help needed standing up from a chair using your arms (e.g., wheelchair or bedside chair)?: A Little Help needed to walk in hospital room?: A Little Help needed climbing 3-5 steps with a railing? : A Little 6 Click Score: 20    End of Session   Activity Tolerance: Patient tolerated treatment well Patient left: in bed;with call bell/phone within reach;with bed alarm set;with nursing/sitter in room;with family/visitor present;with SCD's reapplied Nurse Communication: Mobility status PT Visit Diagnosis: Unsteadiness on feet (R26.81)    Time: 9390-3009 PT Time Calculation (min) (ACUTE ONLY): 23 min   Charges:   PT Evaluation $PT Eval Low Complexity: 1 Low PT Treatments $Therapeutic Activity: 8-22 mins       Minna Merritts, PT, MPT   Percell Locus 05/09/2021, 9:13 AM

## 2021-05-09 NOTE — Progress Notes (Signed)
*  PRELIMINARY RESULTS* Echocardiogram 2D Echocardiogram has been performed.  David Myers 05/09/2021, 10:52 AM

## 2021-05-10 LAB — RPR: RPR Ser Ql: NONREACTIVE

## 2021-05-14 LAB — HEMOGLOBIN A1C
Hgb A1c MFr Bld: 7.5 % — ABNORMAL HIGH (ref 4.8–5.6)
Mean Plasma Glucose: 169 mg/dL

## 2021-05-17 DIAGNOSIS — R9082 White matter disease, unspecified: Secondary | ICD-10-CM | POA: Diagnosis not present

## 2021-05-17 DIAGNOSIS — R1310 Dysphagia, unspecified: Secondary | ICD-10-CM | POA: Diagnosis not present

## 2021-05-17 DIAGNOSIS — G309 Alzheimer's disease, unspecified: Secondary | ICD-10-CM | POA: Diagnosis not present

## 2021-05-17 DIAGNOSIS — F0280 Dementia in other diseases classified elsewhere without behavioral disturbance: Secondary | ICD-10-CM | POA: Diagnosis not present

## 2021-05-17 DIAGNOSIS — F0150 Vascular dementia without behavioral disturbance: Secondary | ICD-10-CM | POA: Diagnosis not present

## 2021-05-26 DIAGNOSIS — Z7984 Long term (current) use of oral hypoglycemic drugs: Secondary | ICD-10-CM | POA: Diagnosis not present

## 2021-05-26 DIAGNOSIS — G309 Alzheimer's disease, unspecified: Secondary | ICD-10-CM | POA: Diagnosis not present

## 2021-05-26 DIAGNOSIS — E538 Deficiency of other specified B group vitamins: Secondary | ICD-10-CM | POA: Diagnosis not present

## 2021-05-26 DIAGNOSIS — R4781 Slurred speech: Secondary | ICD-10-CM | POA: Diagnosis not present

## 2021-05-26 DIAGNOSIS — M48061 Spinal stenosis, lumbar region without neurogenic claudication: Secondary | ICD-10-CM | POA: Diagnosis not present

## 2021-05-26 DIAGNOSIS — F02B18 Dementia in other diseases classified elsewhere, moderate, with other behavioral disturbance: Secondary | ICD-10-CM | POA: Diagnosis not present

## 2021-05-26 DIAGNOSIS — F01B11 Vascular dementia, moderate, with agitation: Secondary | ICD-10-CM | POA: Diagnosis not present

## 2021-05-26 DIAGNOSIS — E119 Type 2 diabetes mellitus without complications: Secondary | ICD-10-CM | POA: Diagnosis not present

## 2021-05-26 DIAGNOSIS — M5136 Other intervertebral disc degeneration, lumbar region: Secondary | ICD-10-CM | POA: Diagnosis not present

## 2021-05-26 DIAGNOSIS — Z86010 Personal history of colonic polyps: Secondary | ICD-10-CM | POA: Diagnosis not present

## 2021-05-26 DIAGNOSIS — Z8673 Personal history of transient ischemic attack (TIA), and cerebral infarction without residual deficits: Secondary | ICD-10-CM | POA: Diagnosis not present

## 2021-05-26 DIAGNOSIS — N400 Enlarged prostate without lower urinary tract symptoms: Secondary | ICD-10-CM | POA: Diagnosis not present

## 2021-05-26 DIAGNOSIS — G4730 Sleep apnea, unspecified: Secondary | ICD-10-CM | POA: Diagnosis not present

## 2021-05-26 DIAGNOSIS — F02B11 Dementia in other diseases classified elsewhere, moderate, with agitation: Secondary | ICD-10-CM | POA: Diagnosis not present

## 2021-05-26 DIAGNOSIS — K219 Gastro-esophageal reflux disease without esophagitis: Secondary | ICD-10-CM | POA: Diagnosis not present

## 2021-05-26 DIAGNOSIS — E785 Hyperlipidemia, unspecified: Secondary | ICD-10-CM | POA: Diagnosis not present

## 2021-05-26 DIAGNOSIS — Z87440 Personal history of urinary (tract) infections: Secondary | ICD-10-CM | POA: Diagnosis not present

## 2021-05-26 DIAGNOSIS — R1310 Dysphagia, unspecified: Secondary | ICD-10-CM | POA: Diagnosis not present

## 2021-05-26 DIAGNOSIS — F01B18 Vascular dementia, moderate, with other behavioral disturbance: Secondary | ICD-10-CM | POA: Diagnosis not present

## 2021-05-26 DIAGNOSIS — R29810 Facial weakness: Secondary | ICD-10-CM | POA: Diagnosis not present

## 2021-05-26 DIAGNOSIS — Z7982 Long term (current) use of aspirin: Secondary | ICD-10-CM | POA: Diagnosis not present

## 2021-05-26 DIAGNOSIS — Z8616 Personal history of COVID-19: Secondary | ICD-10-CM | POA: Diagnosis not present

## 2021-05-26 DIAGNOSIS — I10 Essential (primary) hypertension: Secondary | ICD-10-CM | POA: Diagnosis not present

## 2021-05-26 DIAGNOSIS — G8194 Hemiplegia, unspecified affecting left nondominant side: Secondary | ICD-10-CM | POA: Diagnosis not present

## 2021-05-26 DIAGNOSIS — Z87442 Personal history of urinary calculi: Secondary | ICD-10-CM | POA: Diagnosis not present

## 2021-06-01 DIAGNOSIS — Z87440 Personal history of urinary (tract) infections: Secondary | ICD-10-CM | POA: Diagnosis not present

## 2021-06-01 DIAGNOSIS — G8194 Hemiplegia, unspecified affecting left nondominant side: Secondary | ICD-10-CM | POA: Diagnosis not present

## 2021-06-01 DIAGNOSIS — F01B18 Vascular dementia, moderate, with other behavioral disturbance: Secondary | ICD-10-CM | POA: Diagnosis not present

## 2021-06-01 DIAGNOSIS — Z8673 Personal history of transient ischemic attack (TIA), and cerebral infarction without residual deficits: Secondary | ICD-10-CM | POA: Diagnosis not present

## 2021-06-01 DIAGNOSIS — E119 Type 2 diabetes mellitus without complications: Secondary | ICD-10-CM | POA: Diagnosis not present

## 2021-06-01 DIAGNOSIS — I10 Essential (primary) hypertension: Secondary | ICD-10-CM | POA: Diagnosis not present

## 2021-06-01 DIAGNOSIS — G309 Alzheimer's disease, unspecified: Secondary | ICD-10-CM | POA: Diagnosis not present

## 2021-06-01 DIAGNOSIS — F02B11 Dementia in other diseases classified elsewhere, moderate, with agitation: Secondary | ICD-10-CM | POA: Diagnosis not present

## 2021-06-01 DIAGNOSIS — F01B11 Vascular dementia, moderate, with agitation: Secondary | ICD-10-CM | POA: Diagnosis not present

## 2021-06-01 DIAGNOSIS — N400 Enlarged prostate without lower urinary tract symptoms: Secondary | ICD-10-CM | POA: Diagnosis not present

## 2021-06-01 DIAGNOSIS — Z7984 Long term (current) use of oral hypoglycemic drugs: Secondary | ICD-10-CM | POA: Diagnosis not present

## 2021-06-01 DIAGNOSIS — E538 Deficiency of other specified B group vitamins: Secondary | ICD-10-CM | POA: Diagnosis not present

## 2021-06-01 DIAGNOSIS — M48061 Spinal stenosis, lumbar region without neurogenic claudication: Secondary | ICD-10-CM | POA: Diagnosis not present

## 2021-06-01 DIAGNOSIS — Z7982 Long term (current) use of aspirin: Secondary | ICD-10-CM | POA: Diagnosis not present

## 2021-06-01 DIAGNOSIS — Z8616 Personal history of COVID-19: Secondary | ICD-10-CM | POA: Diagnosis not present

## 2021-06-01 DIAGNOSIS — M5136 Other intervertebral disc degeneration, lumbar region: Secondary | ICD-10-CM | POA: Diagnosis not present

## 2021-06-01 DIAGNOSIS — R29810 Facial weakness: Secondary | ICD-10-CM | POA: Diagnosis not present

## 2021-06-01 DIAGNOSIS — R4781 Slurred speech: Secondary | ICD-10-CM | POA: Diagnosis not present

## 2021-06-01 DIAGNOSIS — K219 Gastro-esophageal reflux disease without esophagitis: Secondary | ICD-10-CM | POA: Diagnosis not present

## 2021-06-01 DIAGNOSIS — Z86010 Personal history of colonic polyps: Secondary | ICD-10-CM | POA: Diagnosis not present

## 2021-06-01 DIAGNOSIS — R1310 Dysphagia, unspecified: Secondary | ICD-10-CM | POA: Diagnosis not present

## 2021-06-01 DIAGNOSIS — Z87442 Personal history of urinary calculi: Secondary | ICD-10-CM | POA: Diagnosis not present

## 2021-06-01 DIAGNOSIS — E785 Hyperlipidemia, unspecified: Secondary | ICD-10-CM | POA: Diagnosis not present

## 2021-06-01 DIAGNOSIS — F02B18 Dementia in other diseases classified elsewhere, moderate, with other behavioral disturbance: Secondary | ICD-10-CM | POA: Diagnosis not present

## 2021-06-01 DIAGNOSIS — G4730 Sleep apnea, unspecified: Secondary | ICD-10-CM | POA: Diagnosis not present

## 2021-07-14 DIAGNOSIS — F0150 Vascular dementia without behavioral disturbance: Secondary | ICD-10-CM | POA: Diagnosis not present

## 2021-07-14 DIAGNOSIS — F0280 Dementia in other diseases classified elsewhere without behavioral disturbance: Secondary | ICD-10-CM | POA: Diagnosis not present

## 2021-07-14 DIAGNOSIS — G309 Alzheimer's disease, unspecified: Secondary | ICD-10-CM | POA: Diagnosis not present

## 2021-07-21 DIAGNOSIS — F0150 Vascular dementia without behavioral disturbance: Secondary | ICD-10-CM | POA: Diagnosis not present

## 2021-07-21 DIAGNOSIS — Z7984 Long term (current) use of oral hypoglycemic drugs: Secondary | ICD-10-CM | POA: Diagnosis not present

## 2021-07-21 DIAGNOSIS — F0280 Dementia in other diseases classified elsewhere without behavioral disturbance: Secondary | ICD-10-CM | POA: Diagnosis not present

## 2021-07-21 DIAGNOSIS — N400 Enlarged prostate without lower urinary tract symptoms: Secondary | ICD-10-CM | POA: Diagnosis not present

## 2021-07-21 DIAGNOSIS — Z7982 Long term (current) use of aspirin: Secondary | ICD-10-CM | POA: Diagnosis not present

## 2021-07-21 DIAGNOSIS — Z87442 Personal history of urinary calculi: Secondary | ICD-10-CM | POA: Diagnosis not present

## 2021-07-21 DIAGNOSIS — I69322 Dysarthria following cerebral infarction: Secondary | ICD-10-CM | POA: Diagnosis not present

## 2021-07-21 DIAGNOSIS — I69392 Facial weakness following cerebral infarction: Secondary | ICD-10-CM | POA: Diagnosis not present

## 2021-07-21 DIAGNOSIS — G309 Alzheimer's disease, unspecified: Secondary | ICD-10-CM | POA: Diagnosis not present

## 2021-07-21 DIAGNOSIS — E119 Type 2 diabetes mellitus without complications: Secondary | ICD-10-CM | POA: Diagnosis not present

## 2021-07-21 DIAGNOSIS — I69354 Hemiplegia and hemiparesis following cerebral infarction affecting left non-dominant side: Secondary | ICD-10-CM | POA: Diagnosis not present

## 2021-07-21 DIAGNOSIS — Z8616 Personal history of COVID-19: Secondary | ICD-10-CM | POA: Diagnosis not present

## 2021-07-21 DIAGNOSIS — Z87440 Personal history of urinary (tract) infections: Secondary | ICD-10-CM | POA: Diagnosis not present

## 2021-07-28 DIAGNOSIS — H02135 Senile ectropion of left lower eyelid: Secondary | ICD-10-CM | POA: Diagnosis not present

## 2021-07-28 DIAGNOSIS — H10239 Serous conjunctivitis, except viral, unspecified eye: Secondary | ICD-10-CM | POA: Diagnosis not present

## 2021-07-28 DIAGNOSIS — Z961 Presence of intraocular lens: Secondary | ICD-10-CM | POA: Diagnosis not present

## 2021-07-28 DIAGNOSIS — R7309 Other abnormal glucose: Secondary | ICD-10-CM | POA: Diagnosis not present

## 2021-07-28 DIAGNOSIS — H401212 Low-tension glaucoma, right eye, moderate stage: Secondary | ICD-10-CM | POA: Diagnosis not present

## 2021-07-28 DIAGNOSIS — H401221 Low-tension glaucoma, left eye, mild stage: Secondary | ICD-10-CM | POA: Diagnosis not present

## 2021-07-28 DIAGNOSIS — I6930 Unspecified sequelae of cerebral infarction: Secondary | ICD-10-CM | POA: Diagnosis not present

## 2021-07-28 DIAGNOSIS — H02132 Senile ectropion of right lower eyelid: Secondary | ICD-10-CM | POA: Diagnosis not present

## 2021-07-30 DIAGNOSIS — Z87442 Personal history of urinary calculi: Secondary | ICD-10-CM | POA: Diagnosis not present

## 2021-07-30 DIAGNOSIS — Z7982 Long term (current) use of aspirin: Secondary | ICD-10-CM | POA: Diagnosis not present

## 2021-07-30 DIAGNOSIS — F0150 Vascular dementia without behavioral disturbance: Secondary | ICD-10-CM | POA: Diagnosis not present

## 2021-07-30 DIAGNOSIS — Z8616 Personal history of COVID-19: Secondary | ICD-10-CM | POA: Diagnosis not present

## 2021-07-30 DIAGNOSIS — F0280 Dementia in other diseases classified elsewhere without behavioral disturbance: Secondary | ICD-10-CM | POA: Diagnosis not present

## 2021-07-30 DIAGNOSIS — I69354 Hemiplegia and hemiparesis following cerebral infarction affecting left non-dominant side: Secondary | ICD-10-CM | POA: Diagnosis not present

## 2021-07-30 DIAGNOSIS — Z87440 Personal history of urinary (tract) infections: Secondary | ICD-10-CM | POA: Diagnosis not present

## 2021-07-30 DIAGNOSIS — N400 Enlarged prostate without lower urinary tract symptoms: Secondary | ICD-10-CM | POA: Diagnosis not present

## 2021-07-30 DIAGNOSIS — Z7984 Long term (current) use of oral hypoglycemic drugs: Secondary | ICD-10-CM | POA: Diagnosis not present

## 2021-07-30 DIAGNOSIS — I69322 Dysarthria following cerebral infarction: Secondary | ICD-10-CM | POA: Diagnosis not present

## 2021-07-30 DIAGNOSIS — E119 Type 2 diabetes mellitus without complications: Secondary | ICD-10-CM | POA: Diagnosis not present

## 2021-07-30 DIAGNOSIS — G309 Alzheimer's disease, unspecified: Secondary | ICD-10-CM | POA: Diagnosis not present

## 2021-07-30 DIAGNOSIS — I69392 Facial weakness following cerebral infarction: Secondary | ICD-10-CM | POA: Diagnosis not present

## 2021-08-10 DIAGNOSIS — Z8616 Personal history of COVID-19: Secondary | ICD-10-CM | POA: Diagnosis not present

## 2021-08-10 DIAGNOSIS — E119 Type 2 diabetes mellitus without complications: Secondary | ICD-10-CM | POA: Diagnosis not present

## 2021-08-10 DIAGNOSIS — N400 Enlarged prostate without lower urinary tract symptoms: Secondary | ICD-10-CM | POA: Diagnosis not present

## 2021-08-10 DIAGNOSIS — Z87440 Personal history of urinary (tract) infections: Secondary | ICD-10-CM | POA: Diagnosis not present

## 2021-08-10 DIAGNOSIS — I69392 Facial weakness following cerebral infarction: Secondary | ICD-10-CM | POA: Diagnosis not present

## 2021-08-10 DIAGNOSIS — I69354 Hemiplegia and hemiparesis following cerebral infarction affecting left non-dominant side: Secondary | ICD-10-CM | POA: Diagnosis not present

## 2021-08-10 DIAGNOSIS — Z87442 Personal history of urinary calculi: Secondary | ICD-10-CM | POA: Diagnosis not present

## 2021-08-10 DIAGNOSIS — G309 Alzheimer's disease, unspecified: Secondary | ICD-10-CM | POA: Diagnosis not present

## 2021-08-10 DIAGNOSIS — F0280 Dementia in other diseases classified elsewhere without behavioral disturbance: Secondary | ICD-10-CM | POA: Diagnosis not present

## 2021-08-10 DIAGNOSIS — Z7982 Long term (current) use of aspirin: Secondary | ICD-10-CM | POA: Diagnosis not present

## 2021-08-10 DIAGNOSIS — I69322 Dysarthria following cerebral infarction: Secondary | ICD-10-CM | POA: Diagnosis not present

## 2021-08-10 DIAGNOSIS — Z7984 Long term (current) use of oral hypoglycemic drugs: Secondary | ICD-10-CM | POA: Diagnosis not present

## 2021-08-10 DIAGNOSIS — F0150 Vascular dementia without behavioral disturbance: Secondary | ICD-10-CM | POA: Diagnosis not present

## 2021-08-17 DIAGNOSIS — I69322 Dysarthria following cerebral infarction: Secondary | ICD-10-CM | POA: Diagnosis not present

## 2021-08-17 DIAGNOSIS — E119 Type 2 diabetes mellitus without complications: Secondary | ICD-10-CM | POA: Diagnosis not present

## 2021-08-17 DIAGNOSIS — Z7982 Long term (current) use of aspirin: Secondary | ICD-10-CM | POA: Diagnosis not present

## 2021-08-17 DIAGNOSIS — Z87440 Personal history of urinary (tract) infections: Secondary | ICD-10-CM | POA: Diagnosis not present

## 2021-08-17 DIAGNOSIS — F0150 Vascular dementia without behavioral disturbance: Secondary | ICD-10-CM | POA: Diagnosis not present

## 2021-08-17 DIAGNOSIS — Z7984 Long term (current) use of oral hypoglycemic drugs: Secondary | ICD-10-CM | POA: Diagnosis not present

## 2021-08-17 DIAGNOSIS — F0280 Dementia in other diseases classified elsewhere without behavioral disturbance: Secondary | ICD-10-CM | POA: Diagnosis not present

## 2021-08-17 DIAGNOSIS — N400 Enlarged prostate without lower urinary tract symptoms: Secondary | ICD-10-CM | POA: Diagnosis not present

## 2021-08-17 DIAGNOSIS — G309 Alzheimer's disease, unspecified: Secondary | ICD-10-CM | POA: Diagnosis not present

## 2021-08-17 DIAGNOSIS — I69354 Hemiplegia and hemiparesis following cerebral infarction affecting left non-dominant side: Secondary | ICD-10-CM | POA: Diagnosis not present

## 2021-08-17 DIAGNOSIS — I69392 Facial weakness following cerebral infarction: Secondary | ICD-10-CM | POA: Diagnosis not present

## 2021-08-17 DIAGNOSIS — Z87442 Personal history of urinary calculi: Secondary | ICD-10-CM | POA: Diagnosis not present

## 2021-08-17 DIAGNOSIS — Z8616 Personal history of COVID-19: Secondary | ICD-10-CM | POA: Diagnosis not present

## 2021-08-18 ENCOUNTER — Ambulatory Visit: Payer: Self-pay | Admitting: Urology

## 2021-08-19 DIAGNOSIS — N400 Enlarged prostate without lower urinary tract symptoms: Secondary | ICD-10-CM | POA: Diagnosis not present

## 2021-08-19 DIAGNOSIS — E119 Type 2 diabetes mellitus without complications: Secondary | ICD-10-CM | POA: Diagnosis not present

## 2021-08-19 DIAGNOSIS — Z87440 Personal history of urinary (tract) infections: Secondary | ICD-10-CM | POA: Diagnosis not present

## 2021-08-19 DIAGNOSIS — I69354 Hemiplegia and hemiparesis following cerebral infarction affecting left non-dominant side: Secondary | ICD-10-CM | POA: Diagnosis not present

## 2021-08-19 DIAGNOSIS — Z8616 Personal history of COVID-19: Secondary | ICD-10-CM | POA: Diagnosis not present

## 2021-08-19 DIAGNOSIS — F0280 Dementia in other diseases classified elsewhere without behavioral disturbance: Secondary | ICD-10-CM | POA: Diagnosis not present

## 2021-08-19 DIAGNOSIS — F0150 Vascular dementia without behavioral disturbance: Secondary | ICD-10-CM | POA: Diagnosis not present

## 2021-08-19 DIAGNOSIS — Z87442 Personal history of urinary calculi: Secondary | ICD-10-CM | POA: Diagnosis not present

## 2021-08-19 DIAGNOSIS — Z7984 Long term (current) use of oral hypoglycemic drugs: Secondary | ICD-10-CM | POA: Diagnosis not present

## 2021-08-19 DIAGNOSIS — I69392 Facial weakness following cerebral infarction: Secondary | ICD-10-CM | POA: Diagnosis not present

## 2021-08-19 DIAGNOSIS — G309 Alzheimer's disease, unspecified: Secondary | ICD-10-CM | POA: Diagnosis not present

## 2021-08-19 DIAGNOSIS — Z7982 Long term (current) use of aspirin: Secondary | ICD-10-CM | POA: Diagnosis not present

## 2021-08-19 DIAGNOSIS — I69322 Dysarthria following cerebral infarction: Secondary | ICD-10-CM | POA: Diagnosis not present

## 2021-08-20 DIAGNOSIS — F0280 Dementia in other diseases classified elsewhere without behavioral disturbance: Secondary | ICD-10-CM | POA: Diagnosis not present

## 2021-08-20 DIAGNOSIS — E119 Type 2 diabetes mellitus without complications: Secondary | ICD-10-CM | POA: Diagnosis not present

## 2021-08-20 DIAGNOSIS — I69392 Facial weakness following cerebral infarction: Secondary | ICD-10-CM | POA: Diagnosis not present

## 2021-08-20 DIAGNOSIS — F0150 Vascular dementia without behavioral disturbance: Secondary | ICD-10-CM | POA: Diagnosis not present

## 2021-08-20 DIAGNOSIS — G309 Alzheimer's disease, unspecified: Secondary | ICD-10-CM | POA: Diagnosis not present

## 2021-08-20 DIAGNOSIS — I69354 Hemiplegia and hemiparesis following cerebral infarction affecting left non-dominant side: Secondary | ICD-10-CM | POA: Diagnosis not present

## 2021-08-20 DIAGNOSIS — I69322 Dysarthria following cerebral infarction: Secondary | ICD-10-CM | POA: Diagnosis not present

## 2021-09-02 DIAGNOSIS — G309 Alzheimer's disease, unspecified: Secondary | ICD-10-CM | POA: Diagnosis not present

## 2021-09-02 DIAGNOSIS — I69392 Facial weakness following cerebral infarction: Secondary | ICD-10-CM | POA: Diagnosis not present

## 2021-09-02 DIAGNOSIS — Z8616 Personal history of COVID-19: Secondary | ICD-10-CM | POA: Diagnosis not present

## 2021-09-02 DIAGNOSIS — N400 Enlarged prostate without lower urinary tract symptoms: Secondary | ICD-10-CM | POA: Diagnosis not present

## 2021-09-02 DIAGNOSIS — E119 Type 2 diabetes mellitus without complications: Secondary | ICD-10-CM | POA: Diagnosis not present

## 2021-09-02 DIAGNOSIS — I69322 Dysarthria following cerebral infarction: Secondary | ICD-10-CM | POA: Diagnosis not present

## 2021-09-02 DIAGNOSIS — Z7982 Long term (current) use of aspirin: Secondary | ICD-10-CM | POA: Diagnosis not present

## 2021-09-02 DIAGNOSIS — F0280 Dementia in other diseases classified elsewhere without behavioral disturbance: Secondary | ICD-10-CM | POA: Diagnosis not present

## 2021-09-02 DIAGNOSIS — Z87440 Personal history of urinary (tract) infections: Secondary | ICD-10-CM | POA: Diagnosis not present

## 2021-09-02 DIAGNOSIS — Z87442 Personal history of urinary calculi: Secondary | ICD-10-CM | POA: Diagnosis not present

## 2021-09-02 DIAGNOSIS — I69354 Hemiplegia and hemiparesis following cerebral infarction affecting left non-dominant side: Secondary | ICD-10-CM | POA: Diagnosis not present

## 2021-09-02 DIAGNOSIS — Z7984 Long term (current) use of oral hypoglycemic drugs: Secondary | ICD-10-CM | POA: Diagnosis not present

## 2021-09-02 DIAGNOSIS — F0150 Vascular dementia without behavioral disturbance: Secondary | ICD-10-CM | POA: Diagnosis not present

## 2021-09-07 DIAGNOSIS — Z87442 Personal history of urinary calculi: Secondary | ICD-10-CM | POA: Diagnosis not present

## 2021-09-07 DIAGNOSIS — I69322 Dysarthria following cerebral infarction: Secondary | ICD-10-CM | POA: Diagnosis not present

## 2021-09-07 DIAGNOSIS — E119 Type 2 diabetes mellitus without complications: Secondary | ICD-10-CM | POA: Diagnosis not present

## 2021-09-07 DIAGNOSIS — Z7982 Long term (current) use of aspirin: Secondary | ICD-10-CM | POA: Diagnosis not present

## 2021-09-07 DIAGNOSIS — N400 Enlarged prostate without lower urinary tract symptoms: Secondary | ICD-10-CM | POA: Diagnosis not present

## 2021-09-07 DIAGNOSIS — Z8616 Personal history of COVID-19: Secondary | ICD-10-CM | POA: Diagnosis not present

## 2021-09-07 DIAGNOSIS — F0280 Dementia in other diseases classified elsewhere without behavioral disturbance: Secondary | ICD-10-CM | POA: Diagnosis not present

## 2021-09-07 DIAGNOSIS — F0150 Vascular dementia without behavioral disturbance: Secondary | ICD-10-CM | POA: Diagnosis not present

## 2021-09-07 DIAGNOSIS — I69392 Facial weakness following cerebral infarction: Secondary | ICD-10-CM | POA: Diagnosis not present

## 2021-09-07 DIAGNOSIS — Z7984 Long term (current) use of oral hypoglycemic drugs: Secondary | ICD-10-CM | POA: Diagnosis not present

## 2021-09-07 DIAGNOSIS — I69354 Hemiplegia and hemiparesis following cerebral infarction affecting left non-dominant side: Secondary | ICD-10-CM | POA: Diagnosis not present

## 2021-09-07 DIAGNOSIS — Z87440 Personal history of urinary (tract) infections: Secondary | ICD-10-CM | POA: Diagnosis not present

## 2021-09-07 DIAGNOSIS — G309 Alzheimer's disease, unspecified: Secondary | ICD-10-CM | POA: Diagnosis not present

## 2021-09-14 DIAGNOSIS — I69322 Dysarthria following cerebral infarction: Secondary | ICD-10-CM | POA: Diagnosis not present

## 2021-09-14 DIAGNOSIS — G309 Alzheimer's disease, unspecified: Secondary | ICD-10-CM | POA: Diagnosis not present

## 2021-09-14 DIAGNOSIS — Z7982 Long term (current) use of aspirin: Secondary | ICD-10-CM | POA: Diagnosis not present

## 2021-09-14 DIAGNOSIS — Z87440 Personal history of urinary (tract) infections: Secondary | ICD-10-CM | POA: Diagnosis not present

## 2021-09-14 DIAGNOSIS — E119 Type 2 diabetes mellitus without complications: Secondary | ICD-10-CM | POA: Diagnosis not present

## 2021-09-14 DIAGNOSIS — Z7984 Long term (current) use of oral hypoglycemic drugs: Secondary | ICD-10-CM | POA: Diagnosis not present

## 2021-09-14 DIAGNOSIS — I69392 Facial weakness following cerebral infarction: Secondary | ICD-10-CM | POA: Diagnosis not present

## 2021-09-14 DIAGNOSIS — F0280 Dementia in other diseases classified elsewhere without behavioral disturbance: Secondary | ICD-10-CM | POA: Diagnosis not present

## 2021-09-14 DIAGNOSIS — N400 Enlarged prostate without lower urinary tract symptoms: Secondary | ICD-10-CM | POA: Diagnosis not present

## 2021-09-14 DIAGNOSIS — Z87442 Personal history of urinary calculi: Secondary | ICD-10-CM | POA: Diagnosis not present

## 2021-09-14 DIAGNOSIS — Z8616 Personal history of COVID-19: Secondary | ICD-10-CM | POA: Diagnosis not present

## 2021-09-14 DIAGNOSIS — I69354 Hemiplegia and hemiparesis following cerebral infarction affecting left non-dominant side: Secondary | ICD-10-CM | POA: Diagnosis not present

## 2021-09-14 DIAGNOSIS — F0150 Vascular dementia without behavioral disturbance: Secondary | ICD-10-CM | POA: Diagnosis not present

## 2021-09-15 DIAGNOSIS — N400 Enlarged prostate without lower urinary tract symptoms: Secondary | ICD-10-CM | POA: Diagnosis not present

## 2021-09-15 DIAGNOSIS — F0150 Vascular dementia without behavioral disturbance: Secondary | ICD-10-CM | POA: Diagnosis not present

## 2021-09-15 DIAGNOSIS — I69354 Hemiplegia and hemiparesis following cerebral infarction affecting left non-dominant side: Secondary | ICD-10-CM | POA: Diagnosis not present

## 2021-09-15 DIAGNOSIS — G309 Alzheimer's disease, unspecified: Secondary | ICD-10-CM | POA: Diagnosis not present

## 2021-09-15 DIAGNOSIS — Z8616 Personal history of COVID-19: Secondary | ICD-10-CM | POA: Diagnosis not present

## 2021-09-15 DIAGNOSIS — Z87442 Personal history of urinary calculi: Secondary | ICD-10-CM | POA: Diagnosis not present

## 2021-09-15 DIAGNOSIS — E119 Type 2 diabetes mellitus without complications: Secondary | ICD-10-CM | POA: Diagnosis not present

## 2021-09-15 DIAGNOSIS — Z7982 Long term (current) use of aspirin: Secondary | ICD-10-CM | POA: Diagnosis not present

## 2021-09-15 DIAGNOSIS — I69322 Dysarthria following cerebral infarction: Secondary | ICD-10-CM | POA: Diagnosis not present

## 2021-09-15 DIAGNOSIS — F0280 Dementia in other diseases classified elsewhere without behavioral disturbance: Secondary | ICD-10-CM | POA: Diagnosis not present

## 2021-09-15 DIAGNOSIS — I69392 Facial weakness following cerebral infarction: Secondary | ICD-10-CM | POA: Diagnosis not present

## 2021-09-15 DIAGNOSIS — Z87440 Personal history of urinary (tract) infections: Secondary | ICD-10-CM | POA: Diagnosis not present

## 2021-09-15 DIAGNOSIS — Z7984 Long term (current) use of oral hypoglycemic drugs: Secondary | ICD-10-CM | POA: Diagnosis not present

## 2021-09-21 DIAGNOSIS — Z86010 Personal history of colonic polyps: Secondary | ICD-10-CM | POA: Diagnosis not present

## 2021-09-21 DIAGNOSIS — F0280 Dementia in other diseases classified elsewhere without behavioral disturbance: Secondary | ICD-10-CM | POA: Diagnosis not present

## 2021-09-21 DIAGNOSIS — I69392 Facial weakness following cerebral infarction: Secondary | ICD-10-CM | POA: Diagnosis not present

## 2021-09-21 DIAGNOSIS — Z7982 Long term (current) use of aspirin: Secondary | ICD-10-CM | POA: Diagnosis not present

## 2021-09-21 DIAGNOSIS — E119 Type 2 diabetes mellitus without complications: Secondary | ICD-10-CM | POA: Diagnosis not present

## 2021-09-21 DIAGNOSIS — F0150 Vascular dementia without behavioral disturbance: Secondary | ICD-10-CM | POA: Diagnosis not present

## 2021-09-21 DIAGNOSIS — I69354 Hemiplegia and hemiparesis following cerebral infarction affecting left non-dominant side: Secondary | ICD-10-CM | POA: Diagnosis not present

## 2021-09-21 DIAGNOSIS — Z87440 Personal history of urinary (tract) infections: Secondary | ICD-10-CM | POA: Diagnosis not present

## 2021-09-21 DIAGNOSIS — I69322 Dysarthria following cerebral infarction: Secondary | ICD-10-CM | POA: Diagnosis not present

## 2021-09-21 DIAGNOSIS — Z87442 Personal history of urinary calculi: Secondary | ICD-10-CM | POA: Diagnosis not present

## 2021-09-21 DIAGNOSIS — Z8616 Personal history of COVID-19: Secondary | ICD-10-CM | POA: Diagnosis not present

## 2021-09-21 DIAGNOSIS — N400 Enlarged prostate without lower urinary tract symptoms: Secondary | ICD-10-CM | POA: Diagnosis not present

## 2021-09-21 DIAGNOSIS — Z7984 Long term (current) use of oral hypoglycemic drugs: Secondary | ICD-10-CM | POA: Diagnosis not present

## 2021-09-21 DIAGNOSIS — G309 Alzheimer's disease, unspecified: Secondary | ICD-10-CM | POA: Diagnosis not present

## 2021-09-24 DIAGNOSIS — H409 Unspecified glaucoma: Secondary | ICD-10-CM | POA: Diagnosis not present

## 2021-09-24 DIAGNOSIS — E1169 Type 2 diabetes mellitus with other specified complication: Secondary | ICD-10-CM | POA: Diagnosis not present

## 2021-09-24 DIAGNOSIS — G4733 Obstructive sleep apnea (adult) (pediatric): Secondary | ICD-10-CM | POA: Diagnosis not present

## 2021-09-24 DIAGNOSIS — F0394 Unspecified dementia, unspecified severity, with anxiety: Secondary | ICD-10-CM | POA: Diagnosis not present

## 2021-09-24 DIAGNOSIS — F03918 Unspecified dementia, unspecified severity, with other behavioral disturbance: Secondary | ICD-10-CM | POA: Diagnosis not present

## 2021-09-24 DIAGNOSIS — Z9183 Wandering in diseases classified elsewhere: Secondary | ICD-10-CM | POA: Diagnosis not present

## 2021-09-24 DIAGNOSIS — E785 Hyperlipidemia, unspecified: Secondary | ICD-10-CM | POA: Diagnosis not present

## 2021-10-05 DIAGNOSIS — E1151 Type 2 diabetes mellitus with diabetic peripheral angiopathy without gangrene: Secondary | ICD-10-CM | POA: Diagnosis not present

## 2021-10-05 DIAGNOSIS — F0150 Vascular dementia without behavioral disturbance: Secondary | ICD-10-CM | POA: Diagnosis not present

## 2021-10-05 DIAGNOSIS — E538 Deficiency of other specified B group vitamins: Secondary | ICD-10-CM | POA: Diagnosis not present

## 2021-10-05 DIAGNOSIS — N1831 Chronic kidney disease, stage 3a: Secondary | ICD-10-CM | POA: Diagnosis not present

## 2021-10-05 DIAGNOSIS — Z0000 Encounter for general adult medical examination without abnormal findings: Secondary | ICD-10-CM | POA: Diagnosis not present

## 2021-10-06 DIAGNOSIS — Z87440 Personal history of urinary (tract) infections: Secondary | ICD-10-CM | POA: Diagnosis not present

## 2021-10-06 DIAGNOSIS — Z7982 Long term (current) use of aspirin: Secondary | ICD-10-CM | POA: Diagnosis not present

## 2021-10-06 DIAGNOSIS — Z86010 Personal history of colonic polyps: Secondary | ICD-10-CM | POA: Diagnosis not present

## 2021-10-06 DIAGNOSIS — Z8616 Personal history of COVID-19: Secondary | ICD-10-CM | POA: Diagnosis not present

## 2021-10-06 DIAGNOSIS — Z87442 Personal history of urinary calculi: Secondary | ICD-10-CM | POA: Diagnosis not present

## 2021-10-06 DIAGNOSIS — I69322 Dysarthria following cerebral infarction: Secondary | ICD-10-CM | POA: Diagnosis not present

## 2021-10-06 DIAGNOSIS — I69392 Facial weakness following cerebral infarction: Secondary | ICD-10-CM | POA: Diagnosis not present

## 2021-10-06 DIAGNOSIS — I69354 Hemiplegia and hemiparesis following cerebral infarction affecting left non-dominant side: Secondary | ICD-10-CM | POA: Diagnosis not present

## 2021-10-06 DIAGNOSIS — F0150 Vascular dementia without behavioral disturbance: Secondary | ICD-10-CM | POA: Diagnosis not present

## 2021-10-06 DIAGNOSIS — N400 Enlarged prostate without lower urinary tract symptoms: Secondary | ICD-10-CM | POA: Diagnosis not present

## 2021-10-06 DIAGNOSIS — E119 Type 2 diabetes mellitus without complications: Secondary | ICD-10-CM | POA: Diagnosis not present

## 2021-10-06 DIAGNOSIS — Z7984 Long term (current) use of oral hypoglycemic drugs: Secondary | ICD-10-CM | POA: Diagnosis not present

## 2021-10-06 DIAGNOSIS — G309 Alzheimer's disease, unspecified: Secondary | ICD-10-CM | POA: Diagnosis not present

## 2021-10-06 DIAGNOSIS — F0280 Dementia in other diseases classified elsewhere without behavioral disturbance: Secondary | ICD-10-CM | POA: Diagnosis not present

## 2021-10-19 DIAGNOSIS — H02132 Senile ectropion of right lower eyelid: Secondary | ICD-10-CM | POA: Diagnosis not present

## 2021-10-19 DIAGNOSIS — R7309 Other abnormal glucose: Secondary | ICD-10-CM | POA: Diagnosis not present

## 2021-10-19 DIAGNOSIS — H02885 Meibomian gland dysfunction left lower eyelid: Secondary | ICD-10-CM | POA: Diagnosis not present

## 2021-10-19 DIAGNOSIS — H02135 Senile ectropion of left lower eyelid: Secondary | ICD-10-CM | POA: Diagnosis not present

## 2021-10-19 DIAGNOSIS — H02882 Meibomian gland dysfunction right lower eyelid: Secondary | ICD-10-CM | POA: Diagnosis not present

## 2021-10-19 DIAGNOSIS — H401212 Low-tension glaucoma, right eye, moderate stage: Secondary | ICD-10-CM | POA: Diagnosis not present

## 2021-10-19 DIAGNOSIS — Z961 Presence of intraocular lens: Secondary | ICD-10-CM | POA: Diagnosis not present

## 2021-10-19 DIAGNOSIS — H401221 Low-tension glaucoma, left eye, mild stage: Secondary | ICD-10-CM | POA: Diagnosis not present

## 2021-10-19 DIAGNOSIS — I6930 Unspecified sequelae of cerebral infarction: Secondary | ICD-10-CM | POA: Diagnosis not present

## 2021-11-25 DIAGNOSIS — G4733 Obstructive sleep apnea (adult) (pediatric): Secondary | ICD-10-CM | POA: Diagnosis not present

## 2021-11-25 DIAGNOSIS — F03918 Unspecified dementia, unspecified severity, with other behavioral disturbance: Secondary | ICD-10-CM | POA: Diagnosis not present

## 2021-11-25 DIAGNOSIS — I69354 Hemiplegia and hemiparesis following cerebral infarction affecting left non-dominant side: Secondary | ICD-10-CM | POA: Diagnosis not present

## 2021-11-25 DIAGNOSIS — Z9183 Wandering in diseases classified elsewhere: Secondary | ICD-10-CM | POA: Diagnosis not present

## 2021-11-25 DIAGNOSIS — N1831 Chronic kidney disease, stage 3a: Secondary | ICD-10-CM | POA: Diagnosis not present

## 2021-11-25 DIAGNOSIS — I739 Peripheral vascular disease, unspecified: Secondary | ICD-10-CM | POA: Diagnosis not present

## 2021-11-25 DIAGNOSIS — F0394 Unspecified dementia, unspecified severity, with anxiety: Secondary | ICD-10-CM | POA: Diagnosis not present

## 2021-12-12 ENCOUNTER — Emergency Department: Payer: PPO

## 2021-12-12 ENCOUNTER — Inpatient Hospital Stay
Admission: EM | Admit: 2021-12-12 | Discharge: 2021-12-16 | DRG: 535 | Disposition: A | Discharge status: Skilled Nursing Facility | Payer: PPO | Attending: Internal Medicine | Admitting: Internal Medicine

## 2021-12-12 ENCOUNTER — Other Ambulatory Visit: Payer: Self-pay

## 2021-12-12 DIAGNOSIS — S329XXA Fracture of unspecified parts of lumbosacral spine and pelvis, initial encounter for closed fracture: Secondary | ICD-10-CM | POA: Diagnosis present

## 2021-12-12 DIAGNOSIS — Z741 Need for assistance with personal care: Secondary | ICD-10-CM | POA: Diagnosis not present

## 2021-12-12 DIAGNOSIS — Z79899 Other long term (current) drug therapy: Secondary | ICD-10-CM

## 2021-12-12 DIAGNOSIS — F32A Depression, unspecified: Secondary | ICD-10-CM | POA: Diagnosis not present

## 2021-12-12 DIAGNOSIS — S32511A Fracture of superior rim of right pubis, initial encounter for closed fracture: Secondary | ICD-10-CM | POA: Diagnosis not present

## 2021-12-12 DIAGNOSIS — Z0389 Encounter for observation for other suspected diseases and conditions ruled out: Secondary | ICD-10-CM | POA: Diagnosis not present

## 2021-12-12 DIAGNOSIS — S32415A Nondisplaced fracture of anterior wall of left acetabulum, initial encounter for closed fracture: Secondary | ICD-10-CM | POA: Diagnosis not present

## 2021-12-12 DIAGNOSIS — D696 Thrombocytopenia, unspecified: Secondary | ICD-10-CM | POA: Diagnosis not present

## 2021-12-12 DIAGNOSIS — S3282XS Multiple fractures of pelvis without disruption of pelvic ring, sequela: Secondary | ICD-10-CM | POA: Diagnosis not present

## 2021-12-12 DIAGNOSIS — F0150 Vascular dementia without behavioral disturbance: Secondary | ICD-10-CM | POA: Diagnosis present

## 2021-12-12 DIAGNOSIS — K219 Gastro-esophageal reflux disease without esophagitis: Secondary | ICD-10-CM | POA: Diagnosis present

## 2021-12-12 DIAGNOSIS — G4733 Obstructive sleep apnea (adult) (pediatric): Secondary | ICD-10-CM | POA: Diagnosis not present

## 2021-12-12 DIAGNOSIS — M25551 Pain in right hip: Secondary | ICD-10-CM | POA: Diagnosis not present

## 2021-12-12 DIAGNOSIS — S3282XA Multiple fractures of pelvis without disruption of pelvic ring, initial encounter for closed fracture: Secondary | ICD-10-CM | POA: Diagnosis not present

## 2021-12-12 DIAGNOSIS — R5381 Other malaise: Secondary | ICD-10-CM | POA: Diagnosis not present

## 2021-12-12 DIAGNOSIS — R7989 Other specified abnormal findings of blood chemistry: Secondary | ICD-10-CM | POA: Diagnosis not present

## 2021-12-12 DIAGNOSIS — Z8249 Family history of ischemic heart disease and other diseases of the circulatory system: Secondary | ICD-10-CM | POA: Diagnosis not present

## 2021-12-12 DIAGNOSIS — Z91199 Patient's noncompliance with other medical treatment and regimen due to unspecified reason: Secondary | ICD-10-CM | POA: Diagnosis not present

## 2021-12-12 DIAGNOSIS — W109XXA Fall (on) (from) unspecified stairs and steps, initial encounter: Secondary | ICD-10-CM | POA: Diagnosis present

## 2021-12-12 DIAGNOSIS — N189 Chronic kidney disease, unspecified: Secondary | ICD-10-CM | POA: Diagnosis not present

## 2021-12-12 DIAGNOSIS — J9601 Acute respiratory failure with hypoxia: Secondary | ICD-10-CM | POA: Diagnosis present

## 2021-12-12 DIAGNOSIS — E559 Vitamin D deficiency, unspecified: Secondary | ICD-10-CM | POA: Diagnosis not present

## 2021-12-12 DIAGNOSIS — Z7982 Long term (current) use of aspirin: Secondary | ICD-10-CM

## 2021-12-12 DIAGNOSIS — J849 Interstitial pulmonary disease, unspecified: Secondary | ICD-10-CM | POA: Diagnosis present

## 2021-12-12 DIAGNOSIS — I69398 Other sequelae of cerebral infarction: Secondary | ICD-10-CM

## 2021-12-12 DIAGNOSIS — F02A0 Dementia in other diseases classified elsewhere, mild, without behavioral disturbance, psychotic disturbance, mood disturbance, and anxiety: Secondary | ICD-10-CM | POA: Diagnosis not present

## 2021-12-12 DIAGNOSIS — Z9981 Dependence on supplemental oxygen: Secondary | ICD-10-CM | POA: Diagnosis not present

## 2021-12-12 DIAGNOSIS — Y92009 Unspecified place in unspecified non-institutional (private) residence as the place of occurrence of the external cause: Secondary | ICD-10-CM | POA: Diagnosis not present

## 2021-12-12 DIAGNOSIS — S32402D Unspecified fracture of left acetabulum, subsequent encounter for fracture with routine healing: Secondary | ICD-10-CM | POA: Diagnosis not present

## 2021-12-12 DIAGNOSIS — S32414A Nondisplaced fracture of anterior wall of right acetabulum, initial encounter for closed fracture: Secondary | ICD-10-CM | POA: Diagnosis present

## 2021-12-12 DIAGNOSIS — S0990XA Unspecified injury of head, initial encounter: Secondary | ICD-10-CM | POA: Diagnosis not present

## 2021-12-12 DIAGNOSIS — R278 Other lack of coordination: Secondary | ICD-10-CM | POA: Diagnosis not present

## 2021-12-12 DIAGNOSIS — S32501A Unspecified fracture of right pubis, initial encounter for closed fracture: Secondary | ICD-10-CM | POA: Diagnosis not present

## 2021-12-12 DIAGNOSIS — M47812 Spondylosis without myelopathy or radiculopathy, cervical region: Secondary | ICD-10-CM | POA: Diagnosis not present

## 2021-12-12 DIAGNOSIS — E785 Hyperlipidemia, unspecified: Secondary | ICD-10-CM | POA: Diagnosis not present

## 2021-12-12 DIAGNOSIS — I129 Hypertensive chronic kidney disease with stage 1 through stage 4 chronic kidney disease, or unspecified chronic kidney disease: Secondary | ICD-10-CM | POA: Diagnosis not present

## 2021-12-12 DIAGNOSIS — S32591D Other specified fracture of right pubis, subsequent encounter for fracture with routine healing: Secondary | ICD-10-CM | POA: Diagnosis not present

## 2021-12-12 DIAGNOSIS — S32511D Fracture of superior rim of right pubis, subsequent encounter for fracture with routine healing: Secondary | ICD-10-CM | POA: Diagnosis not present

## 2021-12-12 DIAGNOSIS — S199XXA Unspecified injury of neck, initial encounter: Secondary | ICD-10-CM | POA: Diagnosis not present

## 2021-12-12 DIAGNOSIS — R2689 Other abnormalities of gait and mobility: Secondary | ICD-10-CM | POA: Diagnosis not present

## 2021-12-12 DIAGNOSIS — I700 Atherosclerosis of aorta: Secondary | ICD-10-CM | POA: Diagnosis not present

## 2021-12-12 DIAGNOSIS — Z87442 Personal history of urinary calculi: Secondary | ICD-10-CM | POA: Diagnosis not present

## 2021-12-12 DIAGNOSIS — Z9181 History of falling: Secondary | ICD-10-CM | POA: Diagnosis not present

## 2021-12-12 DIAGNOSIS — J8489 Other specified interstitial pulmonary diseases: Secondary | ICD-10-CM | POA: Diagnosis present

## 2021-12-12 DIAGNOSIS — E1122 Type 2 diabetes mellitus with diabetic chronic kidney disease: Secondary | ICD-10-CM | POA: Diagnosis present

## 2021-12-12 DIAGNOSIS — S32591A Other specified fracture of right pubis, initial encounter for closed fracture: Secondary | ICD-10-CM | POA: Diagnosis not present

## 2021-12-12 DIAGNOSIS — E1169 Type 2 diabetes mellitus with other specified complication: Secondary | ICD-10-CM

## 2021-12-12 DIAGNOSIS — K5900 Constipation, unspecified: Secondary | ICD-10-CM | POA: Diagnosis present

## 2021-12-12 DIAGNOSIS — M25572 Pain in left ankle and joints of left foot: Secondary | ICD-10-CM | POA: Diagnosis not present

## 2021-12-12 DIAGNOSIS — M6281 Muscle weakness (generalized): Secondary | ICD-10-CM | POA: Diagnosis not present

## 2021-12-12 DIAGNOSIS — S32416A Nondisplaced fracture of anterior wall of unspecified acetabulum, initial encounter for closed fracture: Principal | ICD-10-CM

## 2021-12-12 DIAGNOSIS — U099 Post covid-19 condition, unspecified: Secondary | ICD-10-CM | POA: Diagnosis present

## 2021-12-12 DIAGNOSIS — N1831 Chronic kidney disease, stage 3a: Secondary | ICD-10-CM | POA: Diagnosis present

## 2021-12-12 DIAGNOSIS — R52 Pain, unspecified: Secondary | ICD-10-CM | POA: Diagnosis not present

## 2021-12-12 DIAGNOSIS — S32401D Unspecified fracture of right acetabulum, subsequent encounter for fracture with routine healing: Secondary | ICD-10-CM | POA: Diagnosis not present

## 2021-12-12 DIAGNOSIS — Z743 Need for continuous supervision: Secondary | ICD-10-CM | POA: Diagnosis not present

## 2021-12-12 DIAGNOSIS — N179 Acute kidney failure, unspecified: Secondary | ICD-10-CM | POA: Diagnosis not present

## 2021-12-12 DIAGNOSIS — F419 Anxiety disorder, unspecified: Secondary | ICD-10-CM | POA: Diagnosis not present

## 2021-12-12 DIAGNOSIS — F01A0 Vascular dementia, mild, without behavioral disturbance, psychotic disturbance, mood disturbance, and anxiety: Secondary | ICD-10-CM | POA: Diagnosis not present

## 2021-12-12 DIAGNOSIS — R29898 Other symptoms and signs involving the musculoskeletal system: Secondary | ICD-10-CM | POA: Diagnosis not present

## 2021-12-12 DIAGNOSIS — R0902 Hypoxemia: Secondary | ICD-10-CM | POA: Diagnosis not present

## 2021-12-12 DIAGNOSIS — R0689 Other abnormalities of breathing: Secondary | ICD-10-CM | POA: Diagnosis not present

## 2021-12-12 DIAGNOSIS — G309 Alzheimer's disease, unspecified: Secondary | ICD-10-CM | POA: Diagnosis not present

## 2021-12-12 DIAGNOSIS — W19XXXA Unspecified fall, initial encounter: Secondary | ICD-10-CM | POA: Diagnosis not present

## 2021-12-12 DIAGNOSIS — R4189 Other symptoms and signs involving cognitive functions and awareness: Secondary | ICD-10-CM | POA: Diagnosis not present

## 2021-12-12 HISTORY — DX: Acute kidney failure, unspecified: N18.9

## 2021-12-12 HISTORY — DX: Chronic kidney disease, unspecified: N17.9

## 2021-12-12 LAB — COMPREHENSIVE METABOLIC PANEL
ALT: 21 U/L (ref 0–44)
AST: 26 U/L (ref 15–41)
Albumin: 3.6 g/dL (ref 3.5–5.0)
Alkaline Phosphatase: 43 U/L (ref 38–126)
Anion gap: 9 (ref 5–15)
BUN: 25 mg/dL — ABNORMAL HIGH (ref 8–23)
CO2: 25 mmol/L (ref 22–32)
Calcium: 8.6 mg/dL — ABNORMAL LOW (ref 8.9–10.3)
Chloride: 107 mmol/L (ref 98–111)
Creatinine, Ser: 1.71 mg/dL — ABNORMAL HIGH (ref 0.61–1.24)
GFR, Estimated: 38 mL/min — ABNORMAL LOW (ref >60)
Glucose, Bld: 134 mg/dL — ABNORMAL HIGH (ref 70–99)
Potassium: 4.1 mmol/L (ref 3.5–5.1)
Sodium: 141 mmol/L (ref 135–145)
Total Bilirubin: 0.6 mg/dL (ref 0.3–1.2)
Total Protein: 7.3 g/dL (ref 6.5–8.1)

## 2021-12-12 LAB — CBC WITH DIFFERENTIAL/PLATELET
Abs Immature Granulocytes: 0.05 10*3/uL (ref 0.00–0.07)
Basophils Absolute: 0 10*3/uL (ref 0.0–0.1)
Basophils Relative: 0 %
Eosinophils Absolute: 0.2 10*3/uL (ref 0.0–0.5)
Eosinophils Relative: 2 %
HCT: 35.3 % — ABNORMAL LOW (ref 39.0–52.0)
Hemoglobin: 11.5 g/dL — ABNORMAL LOW (ref 13.0–17.0)
Immature Granulocytes: 1 %
Lymphocytes Relative: 11 %
Lymphs Abs: 1.1 10*3/uL (ref 0.7–4.0)
MCH: 31.1 pg (ref 26.0–34.0)
MCHC: 32.6 g/dL (ref 30.0–36.0)
MCV: 95.4 fL (ref 80.0–100.0)
Monocytes Absolute: 0.5 10*3/uL (ref 0.1–1.0)
Monocytes Relative: 5 %
Neutro Abs: 8.2 10*3/uL — ABNORMAL HIGH (ref 1.7–7.7)
Neutrophils Relative %: 81 %
Platelets: 157 10*3/uL (ref 150–400)
RBC: 3.7 MIL/uL — ABNORMAL LOW (ref 4.22–5.81)
RDW: 13.4 % (ref 11.5–15.5)
WBC: 10.2 10*3/uL (ref 4.0–10.5)
nRBC: 0 % (ref 0.0–0.2)

## 2021-12-12 LAB — CBC
HCT: 34.6 % — ABNORMAL LOW (ref 39.0–52.0)
Hemoglobin: 11.5 g/dL — ABNORMAL LOW (ref 13.0–17.0)
MCH: 31.6 pg (ref 26.0–34.0)
MCHC: 33.2 g/dL (ref 30.0–36.0)
MCV: 95.1 fL (ref 80.0–100.0)
Platelets: 144 10*3/uL — ABNORMAL LOW (ref 150–400)
RBC: 3.64 MIL/uL — ABNORMAL LOW (ref 4.22–5.81)
RDW: 13.4 % (ref 11.5–15.5)
WBC: 9.5 10*3/uL (ref 4.0–10.5)
nRBC: 0 % (ref 0.0–0.2)

## 2021-12-12 LAB — BLOOD GAS, VENOUS
Acid-Base Excess: 0.8 mmol/L (ref 0.0–2.0)
Bicarbonate: 26.6 mmol/L (ref 20.0–28.0)
O2 Saturation: 66.9 %
Patient temperature: 37
pCO2, Ven: 46 mmHg (ref 44–60)
pH, Ven: 7.37 (ref 7.25–7.43)
pO2, Ven: 42 mmHg (ref 32–45)

## 2021-12-12 LAB — D-DIMER, QUANTITATIVE: D-Dimer, Quant: >20 ug/mL-FEU — ABNORMAL HIGH (ref 0.00–0.50)

## 2021-12-12 LAB — CREATININE, SERUM
Creatinine, Ser: 1.52 mg/dL — ABNORMAL HIGH (ref 0.61–1.24)
GFR, Estimated: 44 mL/min — ABNORMAL LOW (ref >60)

## 2021-12-12 MED ORDER — DOCUSATE SODIUM 100 MG PO CAPS
100.0000 mg | ORAL_CAPSULE | Freq: Two times a day (BID) | ORAL | Status: DC
  Administered 2021-12-12 – 2021-12-14 (×5): 100 mg via ORAL
  Filled 2021-12-12 (×5): qty 1

## 2021-12-12 MED ORDER — PANTOPRAZOLE SODIUM 40 MG PO TBEC
40.0000 mg | DELAYED_RELEASE_TABLET | Freq: Every morning | ORAL | Status: DC
  Administered 2021-12-13 – 2021-12-16 (×4): 40 mg via ORAL
  Filled 2021-12-12 (×4): qty 1

## 2021-12-12 MED ORDER — FAMOTIDINE 20 MG PO TABS
20.0000 mg | ORAL_TABLET | Freq: Every day | ORAL | Status: DC
  Administered 2021-12-12 – 2021-12-15 (×4): 20 mg via ORAL
  Filled 2021-12-12 (×4): qty 1

## 2021-12-12 MED ORDER — TAMSULOSIN HCL 0.4 MG PO CAPS
0.4000 mg | ORAL_CAPSULE | Freq: Every morning | ORAL | Status: DC
  Administered 2021-12-13 – 2021-12-16 (×4): 0.4 mg via ORAL
  Filled 2021-12-12 (×4): qty 1

## 2021-12-12 MED ORDER — SIMVASTATIN 20 MG PO TABS
20.0000 mg | ORAL_TABLET | Freq: Every day | ORAL | Status: DC
  Administered 2021-12-12 – 2021-12-15 (×4): 20 mg via ORAL
  Filled 2021-12-12 (×4): qty 1

## 2021-12-12 MED ORDER — ASPIRIN EC 81 MG PO TBEC
81.0000 mg | DELAYED_RELEASE_TABLET | Freq: Every morning | ORAL | Status: DC
  Administered 2021-12-13 – 2021-12-16 (×4): 81 mg via ORAL
  Filled 2021-12-12 (×4): qty 1

## 2021-12-12 MED ORDER — ENOXAPARIN SODIUM 40 MG/0.4ML IJ SOSY
40.0000 mg | PREFILLED_SYRINGE | INTRAMUSCULAR | Status: DC
  Administered 2021-12-12 – 2021-12-15 (×4): 40 mg via SUBCUTANEOUS
  Filled 2021-12-12 (×4): qty 0.4

## 2021-12-12 MED ORDER — TRAMADOL HCL 50 MG PO TABS
50.0000 mg | ORAL_TABLET | Freq: Three times a day (TID) | ORAL | Status: DC | PRN
  Administered 2021-12-13 – 2021-12-16 (×6): 50 mg via ORAL
  Filled 2021-12-12 (×6): qty 1

## 2021-12-12 MED ORDER — ACETAMINOPHEN 500 MG PO TABS: 500.0000 mg | ORAL_TABLET | Freq: Four times a day (QID) | ORAL | Status: DC | PRN

## 2021-12-12 MED ORDER — ONDANSETRON HCL 4 MG PO TABS: 4.0000 mg | ORAL_TABLET | Freq: Four times a day (QID) | ORAL | Status: DC | PRN

## 2021-12-12 MED ORDER — OXYCODONE-ACETAMINOPHEN 5-325 MG PO TABS
1.0000 | ORAL_TABLET | Freq: Once | ORAL | Status: AC
  Administered 2021-12-12: 1 via ORAL
  Filled 2021-12-12: qty 1

## 2021-12-12 MED ORDER — VITAMIN D 25 MCG (1000 UNIT) PO TABS
1000.0000 [IU] | ORAL_TABLET | Freq: Every morning | ORAL | Status: DC
  Administered 2021-12-13 – 2021-12-16 (×4): 1000 [IU] via ORAL
  Filled 2021-12-12 (×4): qty 1

## 2021-12-12 MED ORDER — HYDROCODONE-ACETAMINOPHEN 5-325 MG PO TABS
1.0000 | ORAL_TABLET | ORAL | Status: DC | PRN
  Administered 2021-12-12 – 2021-12-13 (×2): 1 via ORAL
  Filled 2021-12-12 (×2): qty 1

## 2021-12-12 MED ORDER — ONDANSETRON HCL 4 MG/2ML IJ SOLN: 4.0000 mg | Freq: Four times a day (QID) | INTRAMUSCULAR | Status: DC | PRN

## 2021-12-12 MED ORDER — BUSPIRONE HCL 10 MG PO TABS
10.0000 mg | ORAL_TABLET | Freq: Two times a day (BID) | ORAL | Status: DC
  Administered 2021-12-12 – 2021-12-16 (×8): 10 mg via ORAL
  Filled 2021-12-12 (×8): qty 1

## 2021-12-12 MED ORDER — QUETIAPINE FUMARATE 25 MG PO TABS
100.0000 mg | ORAL_TABLET | Freq: Every day | ORAL | Status: DC
  Administered 2021-12-12 – 2021-12-15 (×4): 100 mg via ORAL
  Filled 2021-12-12 (×4): qty 4

## 2021-12-12 MED ORDER — SODIUM CHLORIDE 0.9 % IV SOLN: INTRAVENOUS | Status: DC

## 2021-12-12 NOTE — ED Notes (Signed)
Family and patient updated on plan of care.  Concerned about level of pain and possible chills. No fevers at this time ?

## 2021-12-12 NOTE — ED Triage Notes (Signed)
Pt to ED [facility] from home for R hip pain after fall about 45 min ago. ? ? ?Pt tripped and fell down 2 steps, landed on R hip. Pt states he also fell on lateral R head and R elbow.  EMS noted no deformities, no crepitous. No LOC, does take 81 mg aspirin per day. Pt in NAD sitting up in wheelchair, no visible injuries noted. ? ?Pt currently alert. Hx mild dementia. Wife brought back. Per EMS pt has tendency to wander. Pt uses walker and has some balance problems since stroke last year. ?

## 2021-12-12 NOTE — ED Notes (Signed)
Patient transported to floor at this time by RN ?

## 2021-12-12 NOTE — H&P (Signed)
?History and Physical  ? ? ?David Myers FGH:[id number] DOB: [birth year] DOA: 12/12/2021 ? ?PCP: Rusty Aus, MD  ?Patient coming from: home ? ?I have personally briefly reviewed patient's old medical records in [place] ? ?Chief Complaint: hip pain s/ p Mechanical fall ? ?HPI: David Myers is a 88 y.o. male with medical history significant of DMII, GERD, HTN, OSA noncompliant with CPAP,CVA 2018 with residual gait instability who presents s/p mechanical fall on attempting to ascend stairs.Patient on evaluation found to have pelvic fracture. Patient noted prior to fall in regular state of health. He denies any  n/v/d/chest pain /sob or presyncope.  He did note that he hit his head with fall but had no LOC . ? ? ?ED Course:  ?Vitals: ?Afeb, bp 131/98, rr 16 sat 92% on ra  ?Ed course complicated by desat to 20 s/p pain medication.  ?Patient cxr noted NAD.  ?CTH:NAD ?Cervical Spine:  ?1. No acute cervical spine fracture or subluxation. ?2. Degenerative disc and facet disease. ? ?CT hip  ?1. Multiple acute pelvic fractures including bilateral anterior ?acetabular fractures, fractures of the right superior and inferior ?pubic rami, and questionable fracture in the left sacrum. No ?significant displacement. ?2. Colonic diverticulosis and other chronic findings. ?  ? ?Labs: wbc 10.2, hgb 11.5 (13.2),plt 157 ?NA 141, K4.1  cr 1.71  (1.13 ?Patient was discussed with orthopedics Dr Rudene Christians who noted no need for surgical intervention. Patient is admitted for PT/OT and pain management  ?Review of Systems: As per HPI otherwise 10 point review of systems negative.  ? ?Past Medical History:  ?Diagnosis Date  ? Arthritis   ? Diabetes mellitus without complication ([place])   ? diet controlled  ? GERD (gastroesophageal reflux disease)   ? History of hiatal hernia   ? History of kidney stones   ? Hypertension   ? Sleep apnea   ? does not uses CPAP machine for many years   ? Stroke [facility])   ? 2018  ? ? ?Past Surgical  History:  ?Procedure Laterality Date  ? CATARACT EXTRACTION W/PHACO Left 08/12/2015  ? Procedure: CATARACT EXTRACTION PHACO AND INTRAOCULAR LENS PLACEMENT (IOC);  Surgeon: Leandrew Koyanagi, MD;  Location: [place];  Service: Ophthalmology;  Laterality: Left;  DIABETIC - diet controlled ?TORIC  ? CATARACT EXTRACTION W/PHACO Right 09/09/2015  ? Procedure: CATARACT EXTRACTION PHACO AND INTRAOCULAR LENS PLACEMENT (IOC);  Surgeon: Leandrew Koyanagi, MD;  Location: [place];  Service: Ophthalmology;  Laterality: Right;  DIABETIC - diet controlled  ? COLONOSCOPY    ? CYSTOSCOPY WITH STENT PLACEMENT Left 12/16/2020  ? Procedure: CYSTOSCOPY WITH STENT PLACEMENT;  Surgeon: Abbie Sons, MD;  Location: [facility];  Service: Urology;  Laterality: Left;  ? CYSTOSCOPY/URETEROSCOPY/HOLMIUM LASER/STENT PLACEMENT Left 12/29/2020  ? Procedure: CYSTOSCOPY/URETEROSCOPY/HOLMIUM LASER/STENT PLACEMENT;  Surgeon: Abbie Sons, MD;  Location: [facility];  Service: Urology;  Laterality: Left;  ? EYE SURGERY    ? KYPHOPLASTY N/A 01/27/2021  ? Procedure: T 11 KYPHOPLASTY;  Surgeon: Hessie Knows, MD;  Location: [facility];  Service: Orthopedics;  Laterality: N/A;  ? TRANSURETHRAL RESECTION OF PROSTATE N/A 11/17/2020  ? Procedure: TRANSURETHRAL RESECTION OF THE PROSTATE (TURP);  Surgeon: Abbie Sons, MD;  Location: [facility];  Service: Urology;  Laterality: N/A;  ? URETEROSCOPY Left 12/16/2020  ? Procedure: URETEROSCOPY;  Surgeon: Abbie Sons, MD;  Location: [facility];  Service: Urology;  Laterality: Left;  ? WRIST SURGERY    ? ? ? reports  that he has never smoked. He has never used smokeless tobacco. He reports that he does not drink alcohol and does not use drugs. ? ?Allergies  ?Allergen Reactions  ? Augmentin [Amoxicillin-Pot Clavulanate] Diarrhea  ?  Led to C-diff   ? ? ?Family History  ?Problem Relation Age of Onset  ? Hypertension Mother   ? ? ?Prior to Admission medications   ?Medication Sig Start Date End  Date Taking? Authorizing Provider  ?aspirin EC 81 MG tablet Take 81 mg by mouth in the morning.   Yes [provider]  ?bimatoprost (LUMIGAN) 0.01 % SOLN Place 1 drop into both eyes at bedtime.   Yes [provider]  ?busPIRone (BUSPAR) 5 MG tablet Take 10 mg by mouth 2 (two) times daily. 07/14/21 01/10/22 Yes [provider]  ?Cholecalciferol (VITAMIN D3) 25 MCG (1000 UT) CAPS Take 1,000 Units by mouth in the morning.   Yes [provider]  ?famotidine (PEPCID) 40 MG tablet Take 40 mg by mouth at bedtime. 05/28/20  Yes [provider]  ?pantoprazole (PROTONIX) 40 MG tablet Take 40 mg by mouth in the morning. 09/17/19  Yes [provider]  ?pioglitazone (ACTOS) 15 MG tablet Take 15 mg by mouth in the morning. 05/11/21 05/11/22 Yes [provider]  ?QUEtiapine (SEROQUEL) 100 MG tablet Take 100 mg by mouth at bedtime. 11/29/21  Yes [provider]  ?simvastatin (ZOCOR) 20 MG tablet Take 20 mg by mouth at bedtime.   Yes [provider]  ?tamsulosin (FLOMAX) 0.4 MG CAPS capsule Take 0.4 mg by mouth every morning.   Yes [provider]  ?acetaminophen (TYLENOL) 500 MG tablet Take 500-1,000 mg by mouth every 6 (six) hours as needed for mild pain.    [provider]  ?donepezil (ARICEPT) 5 MG tablet Take 5 mg by mouth at bedtime. ?Patient not taking: Reported on 12/12/2021    [provider]  ? ? ?Physical Exam: ?Vitals:  ? 12/12/21 1622 12/12/21 1624 12/12/21 1630 12/12/21 1900  ?BP:   (!) 137/93 134/73  ?Pulse: 78  76 78  ?Resp:   16 (!) 21  ?Temp:      ?TempSrc:      ?SpO2: (!) 83% 99% 95% 95%  ?Weight:      ?Height:      ? ? ?Constitutional: NAD, calm, comfortable ?Vitals:  ? 12/12/21 1622 12/12/21 1624 12/12/21 1630 12/12/21 1900  ?BP:   (!) 137/93 134/73  ?Pulse: 78  76 78  ?Resp:   16 (!) 21  ?Temp:      ?TempSrc:      ?SpO2: (!) 83% 99% 95% 95%  ?Weight:      ?Height:      ? ?Eyes: PERRL, lids and conjunctivae  normal ?ENMT: Mucous membranes are moist. Posterior pharynx clear of any exudate or lesions.Normal dentition.  ?Neck: normal, supple, no masses, no thyromegaly ?Respiratory: clear to auscultation bilaterally, no wheezing, no crackles. Normal respiratory effort. No accessory muscle use.  ?Cardiovascular: Regular rate and rhythm, no murmurs / rubs / gallops. No extremity edema. 2+ pedal pulses  ?Abdomen: no tenderness, no masses palpated. No hepatosplenomegaly. Bowel sounds positive.  ?Musculoskeletal: no clubbing / cyanosis. No joint deformity upper and lower extremities.note pain with rom, no contractures Normal muscle tone.  ?Skin: no rashes, lesions, ulcers. No induration ?Neurologic: CN 2-12 grossly intact. Sensation intact,  Strength unable to assess due to pain ?Psychiatric: Normal judgment and insight. Alert and oriented x 3. Normal mood.  ? ? ?  Labs on Admission: I have personally reviewed following labs and imaging studies ? ?CBC: ?Recent Labs  ?Lab 12/12/21 ?1623  ?WBC 10.2  ?NEUTROABS 8.2*  ?HGB 11.5*  ?HCT 35.3*  ?MCV 95.4  ?PLT 157  ? ?Basic Metabolic Panel: ?Recent Labs  ?Lab 12/12/21 ?1623  ?NA 141  ?K 4.1  ?CL 107  ?CO2 25  ?GLUCOSE 134*  ?BUN 25*  ?CREATININE 1.71*  ?CALCIUM 8.6*  ? ?GFR: ?Estimated Creatinine Clearance: 30.2 mL/min (A) (by C-G formula based on SCr of 1.71 mg/dL (H)). ?Liver Function Tests: ?Recent Labs  ?Lab 12/12/21 ?1623  ?AST 26  ?ALT 21  ?ALKPHOS 43  ?BILITOT 0.6  ?PROT 7.3  ?ALBUMIN 3.6  ? ?No results for input(s): LIPASE, AMYLASE in the last 168 hours. ?No results for input(s): AMMONIA in the last 168 hours. ?Coagulation Profile: ?No results for input(s): INR, PROTIME in the last 168 hours. ?Cardiac Enzymes: ?No results for input(s): CKTOTAL, CKMB, CKMBINDEX, TROPONINI in the last 168 hours. ?BNP (last 3 results) ?No results for input(s): PROBNP in the last 8760 hours. ?HbA1C: ?No results for input(s): HGBA1C in the last 72 hours. ?CBG: ?No results for input(s): GLUCAP in the  last 168 hours. ?Lipid Profile: ?No results for input(s): CHOL, HDL, LDLCALC, TRIG, CHOLHDL, LDLDIRECT in the last 72 hours. ?Thyroid Function Tests: ?No results for input(s): TSH, T4TOTAL, FREET4, [place], T

## 2021-12-12 NOTE — ED Provider Notes (Addendum)
? ?[facility] ?Provider Note ? ? ? Event Date/Time  ? First MD Initiated Contact with Patient 12/12/21 1442   ?  (approximate) ? ? ?History  ? ?Hip Pain and Fall ? ? ?HPI ? ?David Myers is a 88 y.o. male  with pmh CVA, dementia, DM, GERD who presents with right hip pain.  Patient was walking up stairs outside when he fell onto his right hip did hit his head did not lose consciousness.  He has had significant right hip pain since then has not been able to ambulate.  Denies neck or back pain denies nausea vomiting numbness tingling weakness no chest or abdominal pain. ? ?  ? ?Past Medical History:  ?Diagnosis Date  ? Arthritis   ? Diabetes mellitus without complication ([place])   ? diet controlled  ? GERD (gastroesophageal reflux disease)   ? History of hiatal hernia   ? History of kidney stones   ? Hypertension   ? Sleep apnea   ? does not uses CPAP machine for many years   ? Stroke [facility])   ? 2018  ? ? ?Patient Active Problem List  ? Diagnosis Date Noted  ? Incomplete bladder emptying 02/08/2021  ? Acute cystitis with hematuria   ? AKI (acute kidney injury) ([place])   ? Pyelonephritis 12/16/2020  ? S/P TURP (status post transurethral resection of prostate) 11/17/2020  ? Vascular dementia without behavioral disturbance ([place]) 04/30/2020  ? Chronic bilateral low back pain with bilateral sciatica 03/05/2020  ? Lumbar stenosis with neurogenic claudication 03/05/2020  ? History of transient ischemic attack (TIA) 06/15/2018  ? Numbness and tingling 06/09/2018  ? TIA (transient ischemic attack) 06/09/2018  ? Diabetes mellitus with peripheral angiopathy ([place]) 04/09/2018  ? Hyperlipidemia, mixed 03/28/2017  ? Low serum vitamin D 09/26/2016  ? Medicare annual wellness visit, initial 09/26/2016  ? 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 B12 deficiency 03/23/2015  ? Elevated PSA, less than 10 ng/ml 03/23/2015  ? Degenerative lumbar disc 10/08/2014  ? Nephrolithiasis 02/10/2014  ? ? ? ?Physical  Exam  ?Triage Vital Signs: ?ED Triage Vitals  ?Enc Vitals Group  ?   BP 12/12/21 1345 (!) 131/98  ?   Pulse Rate 12/12/21 1345 86  ?   Resp 12/12/21 1345 16  ?   Temp 12/12/21 1345 97.6 ?F (36.4 ?C)  ?   Temp Source 12/12/21 1345 Oral  ?   SpO2 12/12/21 1345 92 %  ?   Weight 12/12/21 1347 175 lb (79.4 kg)  ?   Height 12/12/21 1347 '5\' 7"'$  (1.702 m)  ?   Head Circumference --   ?   Peak Flow --   ?   Pain Score 12/12/21 1346 5  ?   Pain Loc --   ?   Pain Edu? --   ?   Excl. in [place]? --   ? ? ?Most recent vital signs: ?Vitals:  ? 12/12/21 1624 12/12/21 1630  ?BP:  (!) 137/93  ?Pulse:  76  ?Resp:  16  ?Temp:    ?SpO2: 99% 95%  ? ? ? ?General: Awake, no distress.  ?CV:  Good peripheral perfusion.  ?Resp:  Normal effort.  ?Abd:  No distention.  ?Neuro:             Awake, Alert, Oriented x 3  ?Other:  No signs of trauma to the head or neck, no midline C, T or L-spine tenderness ?No chest wall tenderness ?Soft nontender ?[name] is stable  and minimally tender ?Has significant right hip pain when reclining, pain with Pain with logrolling of the right leg, able to flex minimally ?No focal bony tenderness of the bilateral lower extremities ?DP pulse ? ?ED Results / Procedures / Treatments  ?Labs ?(all labs ordered are listed, but only abnormal results are displayed) ?Labs Reviewed  ?COMPREHENSIVE METABOLIC PANEL - Abnormal; Notable for the following components:  ?    Result Value  ? Glucose, Bld 134 (*)   ? BUN 25 (*)   ? Creatinine, Ser 1.71 (*)   ? Calcium 8.6 (*)   ? GFR, Estimated 38 (*)   ? All other components within normal limits  ?CBC WITH DIFFERENTIAL/PLATELET - Abnormal; Notable for the following components:  ? RBC 3.70 (*)   ? Hemoglobin 11.5 (*)   ? HCT 35.3 (*)   ? Neutro Abs 8.2 (*)   ? All other components within normal limits  ? ? ? ?EKG ? ? ? ? ?RADIOLOGY ?I reviewed the CT scan of the brain which does not show any acute intracranial process; agree with radiology report  ? ?I reviewed the CT of the cervical spine  which does not show any acute fracture or misalignment; agree with radiology report  ? ? ? ?PROCEDURES: ? ?Critical Care performed: No ? ?Procedures ? ? ?MEDICATIONS ORDERED IN ED: ?Medications  ?oxyCODONE-acetaminophen (PERCOCET/ROXICET) 5-325 MG per tablet 1 tablet (1 tablet Oral Given 12/12/21 1514)  ? ? ? ?IMPRESSION / MDM / ASSESSMENT AND PLAN / ED COURSE  ?I reviewed the triage vital signs and the nursing notes. ?             ?               ? ?Differential diagnosis includes, but is not limited to, pelvic fracture, hip fracture, muscle strain, contusion ? ?Is a 88 year old male presents after mechanical fall going up several stairs outside.  Fell onto the right hip and did hit his head.  He has significant right hip pain and has not been able to ambulate.  CT head and C-spine obtained from triage which are without acute abnormality.  X-ray of the right hip and pelvis show possible nondisplaced inferior pubic ramus fracture but difficult to see.  On exam he has significant pain with little movement in the right hip and pain with logrolling the right leg I do suspect occult pelvic fracture.  We will obtain a CT of the pelvis to further characterize and will treat pain with oral opiates. ? ?  ?CT of the pelvis shows bilateral anterior acetabular fractures fracture of the right superior and inferior pubic rami and a questionable left sacral fracture.  Will discuss with orthopedics to see if patient can stay at [facility] or will need transfer. ? ? ?Spoke with Dr. Rudene Christians with orthopedics who reviewed patient's imaging and notes that this is not surgical and so patient can stay at [facility] for admission.  Will need pain control and likely placement.  Physical therapy.  Will discuss with hospitalist for admission. ? ?Patient was noted to have sats in the mid 80s.  Placed on 4 L nasal cannula.  He has equal breath sounds bilaterally will obtain chest x-ray.  Denies chest pain or dyspnea.  Did receive Percocet and has history  of sleep apnea but was not sleeping at the time. ? ?Chest x-ray is clear.  His hypoxia may be related to the fact that he is supine without CPAP or to opiates.  Discussed with hospitalist for admission. ?FINAL CLINICAL IMPRESSION(S) / ED DIAGNOSES  ? ?Final diagnoses:  ?Closed nondisplaced fracture of anterior wall of acetabulum, unspecified laterality, initial encounter ([place])  ?Closed fracture of right inferior pubic ramus, initial encounter ([place])  ?Closed fracture of superior ramus of right pubis, initial encounter ([place])  ? ? ? ?Rx / DC Orders  ? ?ED Discharge Orders   ? ? None  ? ?  ? ? ? ?Note:  This document was prepared using Dragon voice recognition software and may include unintentional dictation errors. ?  ?Rada Hay, MD ?12/12/21 1621 ? ?  ?Rada Hay, MD ?12/12/21 1817 ? ?

## 2021-12-12 NOTE — ED Notes (Signed)
Pt states he lost his balance and fell on some cement steps. Pt now has right hip pain.  ?

## 2021-12-13 ENCOUNTER — Inpatient Hospital Stay: Payer: PPO

## 2021-12-13 DIAGNOSIS — J9601 Acute respiratory failure with hypoxia: Secondary | ICD-10-CM | POA: Diagnosis not present

## 2021-12-13 DIAGNOSIS — F0150 Vascular dementia without behavioral disturbance: Secondary | ICD-10-CM

## 2021-12-13 DIAGNOSIS — E1169 Type 2 diabetes mellitus with other specified complication: Secondary | ICD-10-CM | POA: Diagnosis not present

## 2021-12-13 DIAGNOSIS — S3282XS Multiple fractures of pelvis without disruption of pelvic ring, sequela: Secondary | ICD-10-CM | POA: Diagnosis not present

## 2021-12-13 DIAGNOSIS — G4733 Obstructive sleep apnea (adult) (pediatric): Secondary | ICD-10-CM

## 2021-12-13 DIAGNOSIS — R7989 Other specified abnormal findings of blood chemistry: Secondary | ICD-10-CM

## 2021-12-13 DIAGNOSIS — E785 Hyperlipidemia, unspecified: Secondary | ICD-10-CM

## 2021-12-13 LAB — COMPREHENSIVE METABOLIC PANEL
ALT: 19 U/L (ref 0–44)
AST: 23 U/L (ref 15–41)
Albumin: 3.1 g/dL — ABNORMAL LOW (ref 3.5–5.0)
Alkaline Phosphatase: 39 U/L (ref 38–126)
Anion gap: 8 (ref 5–15)
BUN: 25 mg/dL — ABNORMAL HIGH (ref 8–23)
CO2: 24 mmol/L (ref 22–32)
Calcium: 8.4 mg/dL — ABNORMAL LOW (ref 8.9–10.3)
Chloride: 105 mmol/L (ref 98–111)
Creatinine, Ser: 1.38 mg/dL — ABNORMAL HIGH (ref 0.61–1.24)
GFR, Estimated: 49 mL/min — ABNORMAL LOW (ref >60)
Glucose, Bld: 143 mg/dL — ABNORMAL HIGH (ref 70–99)
Potassium: 4 mmol/L (ref 3.5–5.1)
Sodium: 137 mmol/L (ref 135–145)
Total Bilirubin: 0.8 mg/dL (ref 0.3–1.2)
Total Protein: 6.6 g/dL (ref 6.5–8.1)

## 2021-12-13 MED ORDER — ACETAMINOPHEN 325 MG PO TABS
650.0000 mg | ORAL_TABLET | Freq: Three times a day (TID) | ORAL | Status: DC
  Administered 2021-12-13 – 2021-12-16 (×7): 650 mg via ORAL
  Filled 2021-12-13 (×7): qty 2

## 2021-12-13 MED ORDER — OXYCODONE HCL 5 MG PO TABS
5.0000 mg | ORAL_TABLET | ORAL | Status: DC | PRN
  Filled 2021-12-13: qty 1

## 2021-12-13 MED ORDER — BLISTEX MEDICATED EX OINT: TOPICAL_OINTMENT | CUTANEOUS | Status: DC | PRN

## 2021-12-13 MED ORDER — TECHNETIUM TO 99M ALBUMIN AGGREGATED
4.0000 | Freq: Once | INTRAVENOUS | Status: AC | PRN
  Administered 2021-12-13: 4.34 via INTRAVENOUS

## 2021-12-13 NOTE — Progress Notes (Signed)
PT Cancellation Note ? ?Patient Details ?Name: David Myers ?MRN: [id number] ?DOB: 19-May-1932 ? ? ?Cancelled Treatment:    Reason Eval/Treat Not Completed: Other (comment). Pt eating breakfast upon PT entrance, PT to re-attempt as able.  ? ?Lieutenant Diego PT, DPT ?9:17 AM,12/13/21 ? ?

## 2021-12-13 NOTE — Progress Notes (Signed)
?Progress Note ? ? ?Patient: David Myers TOI:[id number] DOB: 04-Jan-1932 DOA: 12/12/2021     1 ?DOS: the patient was seen and examined on 12/13/2021 ?  ? ?Assessment and Plan: ?* Acute respiratory failure with hypoxia (HCC) ?Patient had a pulse ox of 83% on room air.  Perfusion scan negative for pulmonary embolism.  Likely has interstitial lung disease underlying.  We will see if we can taper oxygen but may end up needing home oxygen.  Patient was on 5 L of oxygen this morning ? ?Pelvis fracture ([place]) ?Bilateral acetabulum fracture right superior and inferior pubic rami fracture and a questionable left sacral fracture.  Pain control.  Physical therapy evaluation. ? ?Elevated d-dimer ?Sonogram of the lower extremity negative for DVT.  Perfusion study negative for PE.  D-dimer could be elevated with interstitial lung disease. ? ?Obstructive sleep apnea ?Patient may end up needing oxygen at home. ? ?Type 2 diabetes mellitus with hyperlipidemia ([place]) ?Sliding scale insulin for right now.  Continue Zocor.  Patient does take Actos at home. ? ?Acute kidney injury superimposed on CKD ([place]) ?AKI on CKD stage IIIa.  creatinine 1.71 on presentation and came down to 1.38 today with IV fluids.  Baseline creatinine looks like ranges between 1.13 and 1.21 ? ?Vascular dementia without behavioral disturbance ([place]) ?Continue aspirin.  Patient also on Aricept as outpatient Seroquel and BuSpar. ? ? ? ? ?  ? ?Subjective: Patient came in after a fall and found to have multiple pelvic fractures.  Does complain of some pain but is okay if still.  Does not really complain of shortness of breath.  Found to be hypoxic on admission and is on oxygen now. ? ?Physical Exam: ?Vitals:  ? 12/12/21 2042 12/13/21 0324 12/13/21 0500 12/13/21 0747  ?BP: (!) 170/94 (!) 143/68  129/68  ?Pulse: 70 82  79  ?Resp: '18 17  20  '$ ?Temp: 97.8 ?F (36.6 ?C) 98.2 ?F (36.8 ?C)  97.7 ?F (36.5 ?C)  ?TempSrc: Oral Oral    ?SpO2: 94% 97%  95%  ?Weight:   84.2 kg    ?Height:      ? ?Physical Exam ?HENT:  ?   Head: Normocephalic.  ?   Mouth/Throat:  ?   Pharynx: No oropharyngeal exudate.  ?Eyes:  ?   General: Lids are normal.  ?   Conjunctiva/sclera: Conjunctivae normal.  ?Cardiovascular:  ?   Rate and Rhythm: Normal rate and regular rhythm.  ?   Heart sounds: Normal heart sounds, S1 normal and S2 normal.  ?Pulmonary:  ?   Breath sounds: Examination of the right-lower field reveals decreased breath sounds and rhonchi. Examination of the left-lower field reveals decreased breath sounds and rhonchi. Decreased breath sounds and rhonchi present. No wheezing or rales.  ?Abdominal:  ?   Palpations: Abdomen is soft.  ?   Tenderness: There is no abdominal tenderness.  ?Musculoskeletal:  ?   Right lower leg: Swelling present.  ?   Left lower leg: Swelling present.  ?Skin: ?   General: Skin is warm.  ?   Findings: No rash.  ?Neurological:  ?   Mental Status: He is alert.  ?  ?Data Reviewed: ?Previous CT scan of the abdomen shows interstitial lung disease at the lung bases.  Chest x-ray consistent with interstitial lung disease.  CT scan of the pelvis shows multiple pelvic fractures.  Creatinine improved to 1.38, hemoglobin 11.5 and platelet count 144 ? ? ?Family Communication: Spoke with wife at the bedside ? ?Disposition: ?Status  is: Inpatient ?Remains inpatient appropriate because: New oxygen requirement and multiple pelvic fractures. ?Planned Discharge Destination: Home with Home Health versus rehab depending on physical therapy evaluation.  Family would like to take home if possible. ? ? ?Author: ?Loletha Grayer, MD ?12/13/2021 3:20 PM ? ?For on call review [email or web address].  ?

## 2021-12-13 NOTE — Progress Notes (Signed)
PT Cancellation Note ? ?Patient Details ?Name: David Myers ?MRN: [id number] ?DOB: 07-Aug-1931 ? ? ?Cancelled Treatment:    Reason Eval/Treat Not Completed: Other (comment). Pt out of room at this time, PT to re-attempt as able. ? ?Lieutenant Diego PT, DPT ?10:11 AM,12/13/21 ? ?

## 2021-12-13 NOTE — Assessment & Plan Note (Addendum)
AKI on CKD stage IIIa.  creatinine 1.71 on presentation and came down to 1.19 with IV fluids.

## 2021-12-13 NOTE — Evaluation (Addendum)
Physical Therapy Evaluation ?Patient Details ?Name: David Myers ?MRN: [id number] ?DOB: September 25, 1931 ?Today's Date: 12/13/2021 ? ?History of Present Illness ? The pt is an 88 y.o. 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 imaging showed "Multiple acute pelvic fractures including bilateral anterior acetabular fractures, fractures of the right superior and inferior pubic rami, andn questionable fracture in the left sacrum. No significant displacement." per orthopedic consult [facility]. ?  ?Clinical Impression ? Pt alert, oriented x4, family at bedside. Pt reported 10/10 pain with any attempts at BLE movement, RN notified and in room towards end of session to administer medication. Per pt at baseline he is ambulatory, uses SPC especially with stairs, does have a RW at home and lives with his wife. ? ?The patient was resistant to any attempts at BLE exercises, but able to perform ankle pumps. Despite education and relaxation cues, unable to perform AAROM at this time. maxAx2 to transition to sitting EOB, pt attempted to initiate scooting anteriorly, still required maxA to complete. Sit <> stand with RW and maxAx2 twice, improved pt weight bearing and initiation noted on second attempt, but still with flexed trunk and significant pain. maxAx2 to return to supine.  Overall the patient demonstrated deficits (see "PT Problem List") that impede the patient's functional abilities, safety, and mobility and would benefit from skilled PT intervention. Recommendation at this time is SNF due to decline from PLOF And current level of assistance needed.    ?   ? ?Recommendations for follow up therapy are one component of a multi-disciplinary discharge planning process, led by the attending physician.  Recommendations may be updated based on patient status, additional functional criteria  and insurance authorization. ? ?Follow Up Recommendations Skilled nursing-short term rehab (<3 hours/day) ? ?  ?Assistance Recommended at Discharge Frequent or constant Supervision/Assistance  ?Patient can return home with the following ? Two people to help with walking and/or transfers;Two people to help with bathing/dressing/bathroom;Direct supervision/assist for medications management;Help with stairs or ramp for entrance;Assist for transportation;Assistance with feeding;Assistance with cooking/housework ? ?  ?Equipment Recommendations Other (comment) (TBD)  ?Recommendations for Other Services ?    ?  ?Functional Status Assessment Patient has had a recent decline in their functional status and demonstrates the ability to make significant improvements in function in a reasonable and predictable amount of time.  ? ?  ?Precautions / Restrictions Precautions ?Precautions: Fall ?Restrictions ?Weight Bearing Restrictions: Yes ?RLE Weight Bearing: Weight bearing as tolerated ?LLE Weight Bearing: Weight bearing as tolerated  ? ?  ? ?Mobility ? Bed Mobility ?Overal bed mobility: Needs Assistance ?Bed Mobility: Supine to Sit, Sit to Supine ?  ?  ?Supine to sit: Max assist, +2 for physical assistance, HOB elevated ?Sit to supine: Max assist, +2 for physical assistance ?  ?  ?  ? ?Transfers ?Overall transfer level: Needs assistance ?Equipment used: Rolling walker (2 wheels) ?Transfers: Sit to/from Stand ?Sit to Stand: Max assist, +2 physical assistance ?  ?  ?  ?  ?  ?General transfer comment: improved pt initiation with second standing attempt ?  ? ?Ambulation/Gait ?  ?  ?  ?  ?  ?  ?  ?  ? ?Stairs ?  ?  ?  ?  ?  ? ?Wheelchair Mobility ?  ? ?Modified Rankin (Stroke Patients Only) ?  ? ?  ? ?Balance Overall balance assessment: Needs assistance ?Sitting-balance  support: Feet supported, Bilateral upper extremity supported ?Sitting balance-Leahy Scale: Fair ?  ?  ?Standing balance support: Reliant on assistive device for  balance ?Standing balance-Leahy Scale: Zero ?  ?  ?  ?  ?  ?  ?  ?  ?  ?  ?  ?  ?   ? ? ? ?Pertinent Vitals/Pain Pain Assessment ?Pain Assessment: 0-10 ?Pain Score: 10-Worst pain ever ?Pain Location: low back, bilateral hips ?Pain Intervention(s): Limited activity within patient's tolerance, Monitored during session, Repositioned, RN gave pain meds during session  ? ? ?Home Living Family/patient expects to be discharged to:: Private residence ?Living Arrangements: Spouse/significant other ?Available Help at Discharge: Family;Available 24 hours/day ?Type of Home: House ?Home Access: Stairs to enter ?Entrance Stairs-Rails: Left;Right ?Entrance Stairs-Number of Steps: 4 ?  ?Home Layout: Multi-level;Able to live on main level with bedroom/bathroom ?Home Equipment: Conservation officer, nature (2 wheels);Cane - single point;BSC/3in1 ?   ?  ?Prior Function Prior Level of Function : Needs assist ?  ?  ?  ?  ?  ?  ?Mobility Comments: uses his SPC for stairs, does have a RW that he's used previously ?  ?  ? ? ?Hand Dominance  ? Dominant Hand: Right ? ?  ?Extremity/Trunk Assessment  ? Upper Extremity Assessment ?Upper Extremity Assessment: Generalized weakness ?  ? ?Lower Extremity Assessment ?Lower Extremity Assessment: Generalized weakness ?  ? ?Cervical / Trunk Assessment ?Cervical / Trunk Assessment: Kyphotic  ?Communication  ? Communication: HOH  ?Cognition Arousal/Alertness: Awake/alert ?Behavior During Therapy: [facility] for tasks assessed/performed ?Overall Cognitive Status: No family/caregiver present to determine baseline cognitive functioning ?  ?  ?  ?  ?  ?  ?  ?  ?  ?  ?  ?  ?  ?  ?  ?  ?General Comments: oriented x4 ?  ?  ? ?  ?General Comments   ? ?  ?Exercises Other Exercises ?Other Exercises: ankle pumps x10, attempted heel slides, pt resistant to any attempt  ? ?Assessment/Plan  ?  ?PT Assessment Patient needs continued PT services  ?PT Problem List Decreased strength;Decreased mobility;Decreased activity tolerance;Decreased  balance;Pain;Decreased knowledge of use of DME;Decreased knowledge of precautions ? ?   ?  ?PT Treatment Interventions DME instruction;Therapeutic exercise;Gait training;Balance training;Neuromuscular re-education;Stair training;Functional mobility training;Therapeutic activities;Patient/family education   ? ?PT Goals (Current goals can be found in the Care Plan section)  ?Acute Rehab PT Goals ?Patient Stated Goal: to go home ?PT Goal Formulation: With patient ?Time For Goal Achievement: 12/27/21 ?Potential to Achieve Goals: Fair ? ?  ?Frequency 7X/week ?  ? ? ?Co-evaluation   ?  ?  ?  ?  ? ? ?  ?AM-PAC PT "6 Clicks" Mobility  ?Outcome Measure Help needed turning from your back to your side while in a flat bed without using bedrails?: Total ?Help needed moving from lying on your back to sitting on the side of a flat bed without using bedrails?: Total ?Help needed moving to and from a bed to a chair (including a wheelchair)?: Total ?Help needed standing up from a chair using your arms (e.g., wheelchair or bedside chair)?: Total ?Help needed to walk in hospital room?: Total ?Help needed climbing 3-5 steps with a railing? : Total ?6 Click Score: 6 ? ?  ?End of Session Equipment Utilized During Treatment: Gait belt ?Activity Tolerance: Patient limited by pain ?Patient left: in bed;with call bell/phone within reach;with bed alarm set ?Nurse Communication: Mobility status ?PT Visit Diagnosis: Other abnormalities of gait and  mobility (R26.89);Difficulty in walking, not elsewhere classified (R26.2);Muscle weakness (generalized) (M62.81);Pain ?Pain - Right/Left:  (bilateral) ?Pain - part of body:  (pelvis) ?  ? ?Time: 2130-8657 ?PT Time Calculation (min) (ACUTE ONLY): 33 min ? ? ?Charges:   PT Evaluation ?$PT Eval Low Complexity: 1 Low ?PT Treatments ?$Therapeutic Activity: 23-37 mins ?  ?   ? ? ?Lieutenant Diego PT, DPT ?4:23 PM,12/13/21 ? ?

## 2021-12-13 NOTE — Assessment & Plan Note (Signed)
Sonogram of the lower extremity negative for DVT.  Perfusion study negative for PE.  D-dimer could be elevated with interstitial lung disease. ?

## 2021-12-13 NOTE — Consult Note (Signed)
Reason for Consult: Multiple pelvic fractures ?Referring Physician: Dr. Brett Canales ? ?David Myers is an 88 y.o. male.  ?HPI: Patient is a 88 year old who suffered a fall yesterday while climbing steps at home.  He normally uses a walker and has been suffering from some dementia.  He lives at home with his wife and I have treated him in the past for kyphoplasty.  He is also had a CVA and has had difficulty walking secondary all these problems.  He has evaluated the ER noted to have multiple pelvic fractures and is consulted regarding whether this is something we can treat here. ? ?Past Medical History:  ?Diagnosis Date  ? Arthritis   ? Diabetes mellitus without complication ([place])   ? diet controlled  ? GERD (gastroesophageal reflux disease)   ? History of hiatal hernia   ? History of kidney stones   ? Hypertension   ? Sleep apnea   ? does not uses CPAP machine for many years   ? Stroke [facility])   ? 2018  ? ? ?Past Surgical History:  ?Procedure Laterality Date  ? CATARACT EXTRACTION W/PHACO Left 08/12/2015  ? Procedure: CATARACT EXTRACTION PHACO AND INTRAOCULAR LENS PLACEMENT (IOC);  Surgeon: Leandrew Koyanagi, MD;  Location: [place];  Service: Ophthalmology;  Laterality: Left;  DIABETIC - diet controlled ?TORIC  ? CATARACT EXTRACTION W/PHACO Right 09/09/2015  ? Procedure: CATARACT EXTRACTION PHACO AND INTRAOCULAR LENS PLACEMENT (IOC);  Surgeon: Leandrew Koyanagi, MD;  Location: [place];  Service: Ophthalmology;  Laterality: Right;  DIABETIC - diet controlled  ? COLONOSCOPY    ? CYSTOSCOPY WITH STENT PLACEMENT Left 12/16/2020  ? Procedure: CYSTOSCOPY WITH STENT PLACEMENT;  Surgeon: Abbie Sons, MD;  Location: [facility];  Service: Urology;  Laterality: Left;  ? CYSTOSCOPY/URETEROSCOPY/HOLMIUM LASER/STENT PLACEMENT Left 12/29/2020  ? Procedure: CYSTOSCOPY/URETEROSCOPY/HOLMIUM LASER/STENT PLACEMENT;  Surgeon: Abbie Sons, MD;  Location: [facility];  Service: Urology;  Laterality: Left;  ?  EYE SURGERY    ? KYPHOPLASTY N/A 01/27/2021  ? Procedure: T 11 KYPHOPLASTY;  Surgeon: Hessie Knows, MD;  Location: [facility];  Service: Orthopedics;  Laterality: N/A;  ? TRANSURETHRAL RESECTION OF PROSTATE N/A 11/17/2020  ? Procedure: TRANSURETHRAL RESECTION OF THE PROSTATE (TURP);  Surgeon: Abbie Sons, MD;  Location: [facility];  Service: Urology;  Laterality: N/A;  ? URETEROSCOPY Left 12/16/2020  ? Procedure: URETEROSCOPY;  Surgeon: Abbie Sons, MD;  Location: [facility];  Service: Urology;  Laterality: Left;  ? WRIST SURGERY    ? ? ?Family History  ?Problem Relation Age of Onset  ? Hypertension Mother   ? ? ?Social History:  reports that he has never smoked. He has never used smokeless tobacco. He reports that he does not drink alcohol and does not use drugs. ? ?Allergies:  ?Allergies  ?Allergen Reactions  ? Augmentin [Amoxicillin-Pot Clavulanate] Diarrhea  ?  Led to C-diff   ? ? ?Medications: I have reviewed the patient's current medications. ? ?Results for orders placed or performed during the hospital encounter of 12/12/21 (from the past 48 hour(s))  ?Comprehensive metabolic panel     Status: Abnormal  ? Collection Time: 12/12/21  4:23 PM  ?Result Value Ref Range  ? Sodium 141 135 - 145 mmol/L  ? Potassium 4.1 3.5 - 5.1 mmol/L  ? Chloride 107 98 - 111 mmol/L  ? CO2 25 22 - 32 mmol/L  ? Glucose, Bld 134 (H) 70 - 99 mg/dL  ?  Comment: Glucose reference range applies only to  samples taken after fasting for at least 8 hours.  ? BUN 25 (H) 8 - 23 mg/dL  ? Creatinine, Ser 1.71 (H) 0.61 - 1.24 mg/dL  ? Calcium 8.6 (L) 8.9 - 10.3 mg/dL  ? Total Protein 7.3 6.5 - 8.1 g/dL  ? Albumin 3.6 3.5 - 5.0 g/dL  ? AST 26 15 - 41 U/L  ? ALT 21 0 - 44 U/L  ? Alkaline Phosphatase 43 38 - 126 U/L  ? Total Bilirubin 0.6 0.3 - 1.2 mg/dL  ? GFR, Estimated 38 (L) >60 mL/min  ?  Comment: (NOTE) ?Calculated using the CKD-EPI Creatinine Equation (2021) ?  ? Anion gap 9 5 - 15  ?  Comment: Performed at [facility], [address] [place], [place] [address]  ?CBC with Differential     Status: Abnormal  ? Collection Time: 12/12/21  4:23 PM  ?Result Value Ref Range  ? WBC 10.2 4.0 - 10.5 K/uL  ? RBC 3.70 (L) 4.22 - 5.81 MIL/uL  ? Hemoglobin 11.5 (L) 13.0 - 17.0 g/dL  ? HCT 35.3 (L) 39.0 - 52.0 %  ? MCV 95.4 80.0 - 100.0 fL  ? MCH 31.1 26.0 - 34.0 pg  ? MCHC 32.6 30.0 - 36.0 g/dL  ? RDW 13.4 11.5 - 15.5 %  ? Platelets 157 150 - 400 K/uL  ? nRBC 0.0 0.0 - 0.2 %  ? Neutrophils Relative % 81 %  ? Neutro Abs 8.2 (H) 1.7 - 7.7 K/uL  ? Lymphocytes Relative 11 %  ? Lymphs Abs 1.1 0.7 - 4.0 K/uL  ? Monocytes Relative 5 %  ? Monocytes Absolute 0.5 0.1 - 1.0 K/uL  ? Eosinophils Relative 2 %  ? Eosinophils Absolute 0.2 0.0 - 0.5 K/uL  ? Basophils Relative 0 %  ? Basophils Absolute 0.0 0.0 - 0.1 K/uL  ? Immature Granulocytes 1 %  ? Abs Immature Granulocytes 0.05 0.00 - 0.07 K/uL  ?  Comment: Performed at [facility], [address] [place], [place] [address]  ?CBC     Status: Abnormal  ? Collection Time: 12/12/21  8:09 PM  ?Result Value Ref Range  ? WBC 9.5 4.0 - 10.5 K/uL  ? RBC 3.64 (L) 4.22 - 5.81 MIL/uL  ? Hemoglobin 11.5 (L) 13.0 - 17.0 g/dL  ? HCT 34.6 (L) 39.0 - 52.0 %  ? MCV 95.1 80.0 - 100.0 fL  ? MCH 31.6 26.0 - 34.0 pg  ? MCHC 33.2 30.0 - 36.0 g/dL  ? RDW 13.4 11.5 - 15.5 %  ? Platelets 144 (L) 150 - 400 K/uL  ? nRBC 0.0 0.0 - 0.2 %  ?  Comment: Performed at [facility], [address], [place], [place] [address]  ?Creatinine, serum     Status: Abnormal  ? Collection Time: 12/12/21  8:09 PM  ?Result Value Ref Range  ? Creatinine, Ser 1.52 (H) 0.61 - 1.24 mg/dL  ? GFR, Estimated 44 (L) >60 mL/min  ?  Comment: (NOTE) ?Calculated using the CKD-EPI Creatinine Equation (2021) ?Performed at [facility], [place], ?[place] [address] ?  ?Blood gas, venous     Status: None  ? Collection Time: 12/12/21  8:09 PM  ?Result Value Ref Range  ? pH, Ven 7.37 7.25 - 7.43  ? pCO2, Ven 46 44 - 60 mmHg  ?  pO2, Ven 42 32 - 45 mmHg  ? Bicarbonate 26.6 20.0 - 28.0 mmol/L  ? Acid-Base Excess 0.8 0.0 -  2.0 mmol/L  ? O2 Saturation 66.9 %  ? Patient temperature 37.0   ? Collection site VENOUS   ?  Comment: Performed at [facility], [address], [place], [place] [address]  ?D-dimer, quantitative     Status: Abnormal  ? Collection Time: 12/12/21  8:09 PM  ?Result Value Ref Range  ? D-Dimer, Quant >20.00 (H) 0.00 - 0.50 ug/mL-FEU  ?  Comment: (NOTE) ?At the manufacturer cut-off value of 0.5 ?g/mL FEU, this assay has a ?negative predictive value of 95-100%.This assay is intended for use ?in conjunction with a clinical pretest probability (PTP) assessment ?model to exclude pulmonary embolism (PE) and deep venous thrombosis ?(DVT) in outpatients suspected of PE or DVT. ?Results should be correlated with clinical presentation. ?Performed at [facility], [place], ?[place] [address] ?CORRECTED ON 05/14 AT 2314: PREVIOUSLY REPORTED AS >20.00 ?  ?Comprehensive metabolic panel     Status: Abnormal  ? Collection Time: 12/13/21  4:07 AM  ?Result Value Ref Range  ? Sodium 137 135 - 145 mmol/L  ? Potassium 4.0 3.5 - 5.1 mmol/L  ? Chloride 105 98 - 111 mmol/L  ? CO2 24 22 - 32 mmol/L  ? Glucose, Bld 143 (H) 70 - 99 mg/dL  ?  Comment: Glucose reference range applies only to samples taken after fasting for at least 8 hours.  ? BUN 25 (H) 8 - 23 mg/dL  ? Creatinine, Ser 1.38 (H) 0.61 - 1.24 mg/dL  ? Calcium 8.4 (L) 8.9 - 10.3 mg/dL  ? Total Protein 6.6 6.5 - 8.1 g/dL  ? Albumin 3.1 (L) 3.5 - 5.0 g/dL  ? AST 23 15 - 41 U/L  ? ALT 19 0 - 44 U/L  ? Alkaline Phosphatase 39 38 - 126 U/L  ? Total Bilirubin 0.8 0.3 - 1.2 mg/dL  ? GFR, Estimated 49 (L) >60 mL/min  ?  Comment: (NOTE) ?Calculated using the CKD-EPI Creatinine Equation (2021) ?  ? Anion gap 8 5 - 15  ?  Comment: Performed at [facility], [address], [place], [place] [address]  ? ? ?DG Chest 2 View ? ?Result Date: 12/13/2021 ?CLINICAL  DATA:  88 year old male with history of hypoxemia. EXAM: CHEST - 2 VIEW COMPARISON:  Chest x-ray 12/12/2021. FINDINGS: Lung volumes are low. Coarse interstitial markings are evident throughout the lungs bil

## 2021-12-13 NOTE — Progress Notes (Signed)
PT Cancellation Note ? ?Patient Details ?Name: David Myers ?MRN: [id number] ?DOB: 12/11/1931 ? ? ?Cancelled Treatment:    Reason Eval/Treat Not Completed: Other (comment). Pt out of room at this time, PT to follow up as able.  ? ? ?Lieutenant Diego PT, DPT ?1:40 PM,12/13/21 ? ?

## 2021-12-13 NOTE — Assessment & Plan Note (Addendum)
Bilateral acetabulum fracture right superior and inferior pubic rami fracture and a questionable left sacral fracture.  Pain control.  Physical therapy evaluation recommending rehab. -- Continue scheduled Tylenol and tramadol as needed  Constipation - related to pain medication.  Bowel regimen ordered including Dulcolax suppository give 5/17 with good results.  Resolved. --Continue bowel regimen --Keep stools soft/loose until pelvic fracture pain has improved without use of pain medications

## 2021-12-13 NOTE — Assessment & Plan Note (Addendum)
Tried and did not tolerate CPAP previously. ?Desaturated at night on 2 L.   ?Recommend 2 L/min during day, and 3 L/min oxygen at night. ?

## 2021-12-13 NOTE — Assessment & Plan Note (Addendum)
Patient had a pulse ox of 83% on room air on presentation.  Perfusion scan negative for pulmonary embolism.  Imaging concerning for underlying interstitial lung disease.  He did have COVID-19 infection previously, may have ILD as sequelae.   ?Pulse ox 85% on room air, qualifies for home O2 at discharge ?--Supplemental O2 as needed to maintain sats above 90%, wean as tolerated. ?--3 L/min nocturnal O2 given underlying sleep apnea and does not tolerate CPAP ?

## 2021-12-13 NOTE — Assessment & Plan Note (Addendum)
Resume home Actos. Continue Zocor.    Covered with sliding scale insulin during admission.

## 2021-12-13 NOTE — Assessment & Plan Note (Addendum)
Continue aspirin.   No longer takes Aricept. Continue Seroquel and BuSpar

## 2021-12-14 DIAGNOSIS — J849 Interstitial pulmonary disease, unspecified: Secondary | ICD-10-CM | POA: Diagnosis present

## 2021-12-14 DIAGNOSIS — N189 Chronic kidney disease, unspecified: Secondary | ICD-10-CM

## 2021-12-14 DIAGNOSIS — N179 Acute kidney failure, unspecified: Secondary | ICD-10-CM

## 2021-12-14 DIAGNOSIS — J9601 Acute respiratory failure with hypoxia: Secondary | ICD-10-CM | POA: Diagnosis not present

## 2021-12-14 DIAGNOSIS — S329XXA Fracture of unspecified parts of lumbosacral spine and pelvis, initial encounter for closed fracture: Secondary | ICD-10-CM | POA: Diagnosis not present

## 2021-12-14 DIAGNOSIS — R7989 Other specified abnormal findings of blood chemistry: Secondary | ICD-10-CM | POA: Diagnosis not present

## 2021-12-14 LAB — CBC
HCT: 30.5 % — ABNORMAL LOW (ref 39.0–52.0)
Hemoglobin: 10.1 g/dL — ABNORMAL LOW (ref 13.0–17.0)
MCH: 31.3 pg (ref 26.0–34.0)
MCHC: 33.1 g/dL (ref 30.0–36.0)
MCV: 94.4 fL (ref 80.0–100.0)
Platelets: 108 10*3/uL — ABNORMAL LOW (ref 150–400)
RBC: 3.23 MIL/uL — ABNORMAL LOW (ref 4.22–5.81)
RDW: 13.5 % (ref 11.5–15.5)
WBC: 6.4 10*3/uL (ref 4.0–10.5)
nRBC: 0 % (ref 0.0–0.2)

## 2021-12-14 LAB — BASIC METABOLIC PANEL
Anion gap: 7 (ref 5–15)
BUN: 21 mg/dL (ref 8–23)
CO2: 25 mmol/L (ref 22–32)
Calcium: 8.4 mg/dL — ABNORMAL LOW (ref 8.9–10.3)
Chloride: 105 mmol/L (ref 98–111)
Creatinine, Ser: 1.19 mg/dL (ref 0.61–1.24)
GFR, Estimated: 59 mL/min — ABNORMAL LOW (ref >60)
Glucose, Bld: 127 mg/dL — ABNORMAL HIGH (ref 70–99)
Potassium: 4 mmol/L (ref 3.5–5.1)
Sodium: 137 mmol/L (ref 135–145)

## 2021-12-14 MED ORDER — LATANOPROST 0.005 % OP SOLN
1.0000 [drp] | Freq: Every day | OPHTHALMIC | Status: DC
  Filled 2021-12-14: qty 2.5

## 2021-12-14 MED ORDER — COVID-19MRNA BIVAL VACC PFIZER 30 MCG/0.3ML IM SUSP
0.3000 mL | Freq: Once | INTRAMUSCULAR | Status: AC
  Administered 2021-12-14: 0.3 mL via INTRAMUSCULAR
  Filled 2021-12-14: qty 0.3

## 2021-12-14 NOTE — Evaluation (Signed)
Occupational Therapy Evaluation ?Patient Details ?Name: David Myers ?MRN: [id number] ?DOB: October 19, 1931 ?Today's Date: 12/14/2021 ? ? ?History of Present Illness The pt is an 88 y.o. 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 imaging showed "Multiple acute pelvic fractures including bilateral anterior acetabular fractures, fractures of the right superior and inferior pubic rami, and questionable fracture in the left sacrum. No significant displacement." Per orthopedic consult WBAT.  ? ?Clinical Impression ?  ?Chart reviewed, pt greeted in bed with wife present agreeable to OT evaluation. Co tx completed with PT on this date. Pt is alert and oriented x4 however decreased multi step direction following with step by step vcs required for technique and all mobility. PTA pt performed ADL generally with MOD I however had supervision or assist as needed. Wife reports supervision for stairs and community mobility with SPC. Pt and wife provided education re: discharge recommendations, role of OT, role of rehab, safe ADL completion, falls prevention. All questions regarding discharge recommendations answered as able. Pt presents with deficits in strength, activity tolerance, endurance, pain all affecting safe and optimal ADL/functional mobility performance. Pt would benefit from skilled OT services to address noted impairments and functional limitations in order to maximize safety and independence while minimizing falls risk and caregiver burden. At this time recommend discharge STR to maximize pt safety and return to PLOF.   ?   ? ?Recommendations for follow up therapy are one component of a multi-disciplinary discharge planning process, led by the attending physician.  Recommendations may be updated based on patient status, additional functional criteria and insurance  authorization.  ? ?Follow Up Recommendations ? Skilled nursing-short term rehab (<3 hours/day)  ?  ?Assistance Recommended at Discharge Frequent or constant Supervision/Assistance  ?Patient can return home with the following A lot of help with walking and/or transfers;A lot of help with bathing/dressing/bathroom;Assistance with cooking/housework;Direct supervision/assist for financial management;Direct supervision/assist for medications management;Help with stairs or ramp for entrance;Assist for transportation ? ?  ?Functional Status Assessment ? Patient has had a recent decline in their functional status and demonstrates the ability to make significant improvements in function in a reasonable and predictable amount of time.  ?Equipment Recommendations ? BSC/3in1;Tub/shower seat  ?  ?Recommendations for Other Services   ? ? ?  ?Precautions / Restrictions Precautions ?Precautions: Fall ?Restrictions ?Weight Bearing Restrictions: Yes ?RLE Weight Bearing: Weight bearing as tolerated ?LLE Weight Bearing: Weight bearing as tolerated  ? ?  ? ?Mobility Bed Mobility ?Overal bed mobility: Needs Assistance ?Bed Mobility: Supine to Sit ?  ?  ?Supine to sit: Max assist, +2 for physical assistance, HOB elevated ?  ?  ?  ?  ? ?Transfers ?Overall transfer level: Needs assistance ?Equipment used: Rolling walker (2 wheels) ?Transfers: Sit to/from Stand ?Sit to Stand: Mod assist, +2 physical assistance ?  ?  ?  ?  ?  ?General transfer comment: step by step vcs ?  ? ?  ?Balance Overall balance assessment: Needs assistance ?Sitting-balance support: No upper extremity supported, Feet supported ?Sitting balance-Leahy Scale: Good ?  ?  ?Standing balance support: Bilateral upper extremity supported, During functional activity, Reliant on assistive device for balance ?Standing balance-Leahy Scale: Poor ?  ?  ?  ?  ?  ?  ?  ?  ?  ?  ?  ?  ?   ? ?ADL either performed or assessed with clinical judgement  ? ?  ADL Overall ADL's : Needs  assistance/impaired ?Eating/Feeding: Sitting;Set up ?  ?Grooming: Dance movement psychotherapist;Wash/dry hands;Sitting;Set up ?  ?  ?  ?Lower Body Bathing: Maximal assistance;Sit to/from stand ?Lower Body Bathing Details (indicate cue type and reason): with RW ?Upper Body Dressing : Minimal assistance;Sitting ?  ?Lower Body Dressing: Maximal assistance;Sit to/from stand ?  ?Toilet Transfer: Moderate assistance;+2 for physical assistance;+2 for safety/equipment;BSC/3in1;Rolling walker (2 wheels) ?Toilet Transfer Details (indicate cue type and reason): step pivot ?Toileting- Clothing Manipulation and Hygiene: Maximal assistance;Sit to/from stand ?  ?  ?  ?Functional mobility during ADLs: Moderate assistance;+2 for physical assistance;Rolling walker (2 wheels) (approx 5 feet) ?General ADL Comments: step by step vcs for all mobility  ? ? ? ?Vision Patient Visual Report: No change from baseline ?   ?   ?Perception   ?  ?Praxis   ?  ? ?Pertinent Vitals/Pain Pain Assessment ?Pain Assessment: 0-10 ?Pain Score: 5  ?Pain Location: pelvis ?Pain Descriptors / Indicators: Grimacing, Guarding, Discomfort ?Pain Intervention(s): Limited activity within patient's tolerance, Monitored during session, Premedicated before session, Repositioned, Utilized relaxation techniques  ? ? ? ?Hand Dominance Right ?  ?Extremity/Trunk Assessment Upper Extremity Assessment ?Upper Extremity Assessment: Generalized weakness ?  ?Lower Extremity Assessment ?Lower Extremity Assessment: Generalized weakness ?  ?  ?  ?Communication Communication ?Communication: HOH ?  ?Cognition Arousal/Alertness: Awake/alert ?Behavior During Therapy: [facility] for tasks assessed/performed ?Overall Cognitive Status: Impaired/Different from baseline ?Area of Impairment: Attention, Following commands, Awareness, Problem solving ?  ?  ?  ?  ?  ?  ?  ?  ?  ?Current Attention Level: Sustained ?  ?Following Commands: Follows multi-step commands inconsistently, Follows one step commands with  increased time ?  ?Awareness: Emergent ?Problem Solving: Difficulty sequencing, Requires verbal cues, Requires tactile cues ?  ?  ?  ?General Comments  SPO2 94% on 4 L via [place] and HR 84 at rest, SPO2 briefly down to 89% with mobility to chair, recovered quickly to >90% on 4 L via [place]; HR up to 112 with mobility ? ?  ?Exercises   ?  ?Shoulder Instructions    ? ? ?Home Living Family/patient expects to be discharged to:: Private residence ?Living Arrangements: Spouse/significant other ?Available Help at Discharge: Family;Available 24 hours/day ?Type of Home: House ?Home Access: Stairs to enter ?Entrance Stairs-Number of Steps: 4 ?Entrance Stairs-Rails: Left;Right ?Home Layout: Multi-level;Able to live on main level with bedroom/bathroom ?  ?  ?Bathroom Shower/Tub: Tub/shower unit;Walk-in shower (walk in shower on first floor, pt prefers to use tub/shower upstairs in the past) ?  ?Bathroom Toilet: Handicapped height ?  ?  ?Home Equipment: Conservation officer, nature (2 wheels);Cane - single point;BSC/3in1;Grab bars - tub/shower ?  ?  ?  ? ?  ?Prior Functioning/Environment   ?  ?  ?  ?  ?  ?  ?Mobility Comments: SPC with supervision for stairs ?ADLs Comments: wife and pt report MOD I in ADLs, assist as needed; assist with IADLs from wife/family ?  ? ?  ?  ?OT Problem List: Decreased strength;Decreased knowledge of use of DME or AE;Decreased activity tolerance;Impaired balance (sitting and/or standing);Decreased safety awareness ?  ?   ?OT Treatment/Interventions: Self-care/ADL training;Therapeutic exercise;Patient/family education;Energy conservation;Therapeutic activities;DME and/or AE instruction  ?  ?OT Goals(Current goals can be found in the care plan section) Acute Rehab OT Goals ?Patient Stated Goal: go home ?OT Goal Formulation: With patient/family ?Time For Goal Achievement: 12/28/21 ?Potential to Achieve Goals: Fair ?ADL Goals ?Pt Will Perform Grooming: standing;sitting;with min assist ?Pt  Will Perform Upper Body Dressing:  sitting;with set-up ?Pt Will Perform Lower Body Dressing: with min assist;sit to/from stand;with adaptive equipment ?Pt Will Transfer to Toilet: with min assist;bedside commode ?Pt Will Perform Toileting - Clothing Manipulation

## 2021-12-14 NOTE — Hospital Course (Addendum)
88 year old man with past medical history of type 2 diabetes mellitus, GERD, hypertension, obstructive sleep apnea noncompliant with CPAP, CVA, dementia.  He had a fall and found to have numerous pelvic fractures.  CT scan showing bilateral anterior acetabular fractures, fractures of the right superior and inferior pubic rami and questionable fracture of the left sacrum.  Patient also found to have an elevated D-dimer and hypoxic.  Ultrasound of the lower extremities negative and perfusion study negative.  Looking back at prior CAT scan of the abdomen it does seem that he does have interstitial lung disease at the bases, possibly sequelae of Covid-19 pneumonia.    Pulse ox of 85% at rest on room air, qualifies for home oxygen.  5/18: pt doing well, although in a fair amount of pain at rest (5 of 10 in severity), just given pain medication. Constipation resolved with bowel regimen including suppository yesterday.  Pt is medically stable for d/c to rehab today.

## 2021-12-14 NOTE — Progress Notes (Signed)
Physical Therapy Treatment ?Patient Details ?Name: David Myers ?MRN: [id number] ?DOB: [birth year] ?Today's Date: 12/14/2021 ? ? ?History of Present Illness The pt is an 88 y.o. 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 imaging showed "Multiple acute pelvic fractures including bilateral anterior acetabular fractures, fractures of the right superior and inferior pubic rami, and questionable fracture in the left sacrum. No significant displacement." Per orthopedic consult WBAT. ? ?  ?PT Comments  ? ? PT/OT co-treatment performed.  Pt resting in bed upon therapy arrival; pt's wife present and requesting pt transfer to [facility].  Pt reporting 4-5/10 pelvic pain at rest beginning/end of session but pain increased with activity.  During session pt max assist x2 semi-supine to sitting edge of bed; mod assist x2 for sit to stand transfers with RW use; min to mod assist x2 stand step turn with RW bed to [facility]; and mod assist x2 to ambulate 5 feet BSC to recliner with RW use (pt's knees buckling 2x's end of ambulation requiring increased assist and cueing).  Will continue to focus on strengthening, balance, and progressive functional mobility during hospitalization. ?   ?Recommendations for follow up therapy are one component of a multi-disciplinary discharge planning process, led by the attending physician.  Recommendations may be updated based on patient status, additional functional criteria and insurance authorization. ? ?Follow Up Recommendations ? Skilled nursing-short term rehab (<3 hours/day) ?  ?  ?Assistance Recommended at Discharge Frequent or constant Supervision/Assistance  ?Patient can return home with the following Two people to help with walking and/or transfers;Two people to help with bathing/dressing/bathroom;Direct supervision/assist for medications management;Help  with stairs or ramp for entrance;Assist for transportation;Assistance with feeding;Assistance with cooking/housework ?  ?Equipment Recommendations ? Rolling walker (2 wheels);BSC/3in1;Wheelchair (measurements PT);Wheelchair cushion (measurements PT);Hospital bed;Other (comment) (hoyer lift)  ?  ?Recommendations for Other Services   ? ? ?  ?Precautions / Restrictions Precautions ?Precautions: Fall ?Restrictions ?Weight Bearing Restrictions: Yes ?RLE Weight Bearing: Weight bearing as tolerated ?LLE Weight Bearing: Weight bearing as tolerated  ?  ? ?Mobility ? Bed Mobility ?Overal bed mobility: Needs Assistance ?Bed Mobility: Supine to Sit ?  ?  ?Supine to sit: Max assist, +2 for physical assistance, HOB elevated ?  ?  ?General bed mobility comments: assist for trunk and B LE's ?  ? ?Transfers ?Overall transfer level: Needs assistance ?Equipment used: Rolling walker (2 wheels) ?Transfers: Sit to/from Stand, Bed to chair/wheelchair/BSC ?Sit to Stand: Mod assist, +2 physical assistance ?  ?Step pivot transfers: Min assist, Mod assist, +2 physical assistance ?  ?  ?  ?General transfer comment: mod assist x2 to stand from bed and from [facility] (vc's for UE/LE placement and scooting forward towards edge of sitting surface prior to standing); min to mod assist x2 stand step turn bed to [facility] with RW use; vc's for increasing UE support through RW to offweight B LE's ?  ? ?Ambulation/Gait ?Ambulation/Gait assistance: Mod assist, +2 physical assistance ?Gait Distance (Feet): 5 Feet ?Assistive device: Rolling walker (2 wheels) ?  ?Gait velocity: decreased ?  ?  ?General Gait Details: antalgic; vc's to increase UE support through RW to offweight B LE's; B knees buckling 2x's towards end of ambulation trial requiring extra vc's and assist ? ? ?Stairs ?  ?  ?  ?  ?  ? ? ?Wheelchair Mobility ?  ? ?Modified Rankin (Stroke Patients Only) ?  ? ? ?  ?  Balance Overall balance assessment: Needs assistance ?Sitting-balance support: No upper  extremity supported, Feet supported ?Sitting balance-Leahy Scale: Good ?Sitting balance - Comments: steady sitting reaching within BOS ?  ?Standing balance support: Bilateral upper extremity supported, During functional activity, Reliant on assistive device for balance ?Standing balance-Leahy Scale: Poor ?Standing balance comment: vc's for upright posture in standing ?  ?  ?  ?  ?  ?  ?  ?  ?  ?  ?  ?  ? ?  ?Cognition Arousal/Alertness: Awake/alert ?Behavior During Therapy: [facility] for tasks assessed/performed ?  ?  ?  ?  ?  ?  ?  ?  ?  ?  ?  ?  ?  ?  ?  ?  ?  ?General Comments: Inconsistent with following multi-step cues; mild confusion noted at times ?  ?  ? ?  ?Exercises   ? ?  ?General Comments  Nursing cleared pt for participation in physical therapy.  Pt agreeable to PT session. ?  ?  ? ?Pertinent Vitals/Pain Pain Assessment ?Pain Assessment: 0-10 ?Pain Score: 5  ?Pain Location: pelvic area ?Pain Descriptors / Indicators: Grimacing, Guarding, Discomfort ?Pain Intervention(s): Limited activity within patient's tolerance, Monitored during session, Premedicated before session, Repositioned ?HR 84-112 bpm and O2 sats 89% or greater on 4 L via nasal cannula during session (O2 sats >94% at rest end of session).  ? ? ?Home Living   ?  ?  ?  ?  ?  ?  ?  ?  ?  ?   ?  ?Prior Function    ?  ?  ?   ? ?PT Goals (current goals can now be found in the care plan section) Acute Rehab PT Goals ?Patient Stated Goal: to go home ?PT Goal Formulation: With patient ?Time For Goal Achievement: 12/27/21 ?Potential to Achieve Goals: Fair ?Progress towards PT goals: Progressing toward goals ? ?  ?Frequency ? ? ? 7X/week ? ? ? ?  ?PT Plan Current plan remains appropriate  ? ? ?Co-evaluation PT/OT/SLP Co-Evaluation/Treatment: Yes ?Reason for Co-Treatment: For patient/therapist safety;To address functional/ADL transfers ?PT goals addressed during session: Mobility/safety with mobility ?OT goals addressed during session: ADL's and self-care ?   ? ?  ?AM-PAC PT "6 Clicks" Mobility   ?Outcome Measure ? Help needed turning from your back to your side while in a flat bed without using bedrails?: Total ?Help needed moving from lying on your back to sitting on the side of a flat bed without using bedrails?: Total ?Help needed moving to and from a bed to a chair (including a wheelchair)?: Total ?Help needed standing up from a chair using your arms (e.g., wheelchair or bedside chair)?: Total ?Help needed to walk in hospital room?: Total ?Help needed climbing 3-5 steps with a railing? : Total ?6 Click Score: 6 ? ?  ?End of Session Equipment Utilized During Treatment: Gait belt ?Activity Tolerance: Patient limited by pain ?Patient left: in chair;with call bell/phone within reach;with chair alarm set;with family/visitor present;Other (comment) (B heels floating via pillow support) ?Nurse Communication: Mobility status;Precautions;Weight bearing status ?PT Visit Diagnosis: Other abnormalities of gait and mobility (R26.89);Difficulty in walking, not elsewhere classified (R26.2);Muscle weakness (generalized) (M62.81);Pain ?Pain - Right/Left:  (B) ?Pain - part of body:  (pelvis) ?  ? ? ?Time: 0254-2706 ?PT Time Calculation (min) (ACUTE ONLY): 38 min ? ?Charges:  $Gait Training: 8-22 mins ?$Therapeutic Activity: 23-37 mins          ?          ?  Leitha Bleak, PT ?12/14/21, 11:12 AM ? ? ?

## 2021-12-14 NOTE — Progress Notes (Signed)
The above named patient is recommended to go to Short Term Rehab for strengthening and gait training for balance.  It is expected that the Short Term Rehab stay will be less than 30 days.  The patient is expected to return home after Rehab.  ?

## 2021-12-14 NOTE — Progress Notes (Signed)
Met with the patient and his wife at the bedside ?They really want to go home with [facility] ?He has a lot of family support at home ?He has dementia and they feel he would not do as well in [facility] has had [facility] in the past and would like to use them again ?I reached out to [place] and requested they accept the patient ?Awaiting a response ?He has DME at home and does not need additional ?He has transportation  ?

## 2021-12-14 NOTE — TOC Progression Note (Signed)
Transition of Care (TOC) - Progression Note  ? ? ?Patient Details  ?Name: David Myers ?MRN: [id number] ?Date of Birth: [birth year] ? ?Transition of Care (TOC) CM/SW Contact  ?Conception Oms, RN ?Phone Number: ?12/14/2021, 3:23 PM ? ?Clinical Narrative:    ? ?Received a call from [facility] Tammy with Ins auth approval auth number [id number] [facility] [place] approved [id number] ? ?Expected Discharge Plan: [place] ?Barriers to Discharge: Continued Medical Work up ? ?Expected Discharge Plan and Services ?Expected Discharge Plan: [place] ?  ?Discharge Planning Services: CM Consult ?  ?Living arrangements for the past 2 months: [place] ?                ?DME Arranged: N/A ?  ?  ?  ?  ?HH Arranged: PT ?[place] Agency: [place] ?Date HH Agency Contacted: 12/14/21 ?Time [place]: 2409 ?Representative spoke with at [place]: [place] ? ? ?Social Determinants of Health (SDOH) Interventions ?  ? ?Readmission Risk Interventions ?   ? View : No data to display.  ?  ?  ?  ? ? ?

## 2021-12-14 NOTE — Progress Notes (Signed)
Pt's 02 decreased into the low 80's on 2L of 02 at 0300.  02 increased to 4L to sustain 02 sats >88%.   ?

## 2021-12-14 NOTE — Progress Notes (Signed)
?Progress Note ? ? ?Patient: David Myers SHF:[id number] DOB: February 07, 1932 DOA: 12/12/2021     2 ?DOS: the patient was seen and examined on 12/14/2021 ?  ?Brief hospital course: ?88 year old man with past medical history of type 2 diabetes mellitus, GERD, hypertension, obstructive sleep apnea noncompliant with CPAP, CVA, dementia.  He had a fall and found to have numerous pelvic fractures.  CT scan showing bilateral anterior acetabular fractures, fractures of the right superior and inferior pubic rami and questionable fracture of the left sacrum.  Patient also found to have an elevated D-dimer and hypoxic.  Ultrasound of the lower extremities negative and perfusion study negative. ? ?Looking back at prior CAT scan of the abdomen it does seem that he does have interstitial lung disease at the bases.  Patient is a pulse ox of 85% at rest on room air.  Patient qualifies for home oxygen. ? ?Assessment and Plan: ?* Acute respiratory failure with hypoxia ([place]) ?Patient had a pulse ox of 83% on room air on presentation.[place] Kitchen  Perfusion scan negative for pulmonary embolism.  Likely has interstitial lung disease underlying, patient did have COVID-19 infection previously.  Pulse ox 85% on room air.  Patient will qualify for home oxygen. ? ?Pelvis fracture ([place]) ?Bilateral acetabulum fracture right superior and inferior pubic rami fracture and a questionable left sacral fracture.  Pain control.  Physical therapy evaluation recommending rehab but wife is hopeful to take patient home. ? ?Elevated d-dimer ?Sonogram of the lower extremity negative for DVT.  Perfusion study negative for PE.  D-dimer could be elevated with interstitial lung disease. ? ?Obstructive sleep apnea ?Desaturated at night on 2 L.  Would probably recommend 2 L during the day and 3 L at night. ? ?Type 2 diabetes mellitus with hyperlipidemia ([place]) ?Sliding scale insulin for right now.  Continue Zocor.  Patient does take Actos at home. ? ?Acute kidney injury  superimposed on CKD ([place]) ?AKI on CKD stage IIIa.  creatinine 1.71 on presentation and came down to 1.19 today with IV fluids.  ? ?Vascular dementia without behavioral disturbance ([place]) ?Continue aspirin.  Patient also on Aricept as outpatient Seroquel and BuSpar. ? ? ? ? ?  ? ?Subjective: Patient feels okay.  Did have some pain when getting into the chair today.  No shortness of breath. ? ?Physical Exam: ?Vitals:  ? 12/14/21 0749 12/14/21 1108 12/14/21 1131 12/14/21 1544  ?BP: 115/70  (!) 119/58 114/66  ?Pulse: 71  72 62  ?Resp: '16  16 16  '$ ?Temp: (!) 97.4 ?F (36.3 ?C)  98.3 ?F (36.8 ?C) 98.6 ?F (37 ?C)  ?TempSrc:      ?SpO2: 95% 95% 92% 96%  ?Weight:      ?Height:      ? ?Physical Exam ?HENT:  ?   Head: Normocephalic.  ?   Mouth/Throat:  ?   Pharynx: No oropharyngeal exudate.  ?Eyes:  ?   General: Lids are normal.  ?   Conjunctiva/sclera: Conjunctivae normal.  ?Cardiovascular:  ?   Rate and Rhythm: Normal rate and regular rhythm.  ?   Heart sounds: Normal heart sounds, S1 normal and S2 normal.  ?Pulmonary:  ?   Breath sounds: Examination of the right-lower field reveals decreased breath sounds and rhonchi. Examination of the left-lower field reveals decreased breath sounds and rhonchi. Decreased breath sounds and rhonchi present. No wheezing or rales.  ?Abdominal:  ?   Palpations: Abdomen is soft.  ?   Tenderness: There is no abdominal tenderness.  ?Musculoskeletal:  ?  Right lower leg: Swelling present.  ?   Left lower leg: Swelling present.  ?Skin: ?   General: Skin is warm.  ?   Findings: No rash.  ?Neurological:  ?   Mental Status: He is alert.  ?  ?Data Reviewed: ?Creatinine improved to 1.19, hemoglobin 10.1, platelet count 108, D-dimer greater than 20 ? ?Family Communication: Updated patient's wife on the phone ? ?Disposition: ?Status is: Inpatient ?Remains inpatient appropriate because: Patient's wife hoping to take the patient home but may end up needing to go to rehab if does not improve with his  strength.  Came in with pelvic fractures.  Also new oxygen requirement ?Planned Discharge Destination: Home with Home Health versus rehab depending on clinical course ? ? ?Author: ?Loletha Grayer, MD ?12/14/2021 3:47 PM ? ?For on call review [email or web address].  ?

## 2021-12-14 NOTE — NC FL2 (Addendum)
[place] LEVEL OF CARE SCREENING TOOL     IDENTIFICATION  Patient Name: David Myers Birthdate: 11-09-1931 Sex: male Admission Date (Current Location): 12/12/2021  [facility] and [place] Number:  Engineering geologist and Address:  [facility], [address], [place], [place] [address]      Provider Number: [id number]  Attending Physician Name and Address:  Loletha Grayer, MD  Relative Name and Phone Number:  [name] SPouse [phone]    Current Level of Care: Hospital Recommended Level of Care: [place] Prior Approval Number:    Date Approved/Denied:   PASRR Number: [id number]  Discharge Plan: SNF    Current Diagnoses: Patient Active Problem List   Diagnosis Date Noted   Acute respiratory failure with hypoxia ([place]) 12/13/2021   Type 2 diabetes mellitus with hyperlipidemia ([place]) 12/13/2021   Obstructive sleep apnea 12/13/2021   Elevated d-dimer 12/13/2021   Pelvis fracture ([place]) 12/12/2021   Incomplete bladder emptying 02/08/2021   Acute cystitis with hematuria    Acute kidney injury superimposed on CKD ([place])    Pyelonephritis 12/16/2020   S/P TURP (status post transurethral resection of prostate) 11/17/2020   Vascular dementia without behavioral disturbance ([place]) 04/30/2020   Chronic bilateral low back pain with bilateral sciatica 03/05/2020   Lumbar stenosis with neurogenic claudication 03/05/2020   History of transient ischemic attack (TIA) 06/15/2018   Numbness and tingling 06/09/2018   TIA (transient ischemic attack) 06/09/2018   Diabetes mellitus with peripheral angiopathy ([place]) 04/09/2018   Hyperlipidemia, mixed 03/28/2017   Low serum vitamin D 09/26/2016   Medicare annual wellness visit, initial 09/26/2016   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   B12 deficiency 03/23/2015   Elevated PSA, less than 10 ng/ml 03/23/2015   Degenerative lumbar disc 10/08/2014   Nephrolithiasis  02/10/2014    Orientation RESPIRATION BLADDER Height & Weight     Self, Time, Situation, Place  O2 (4 liters) Continent Weight: 84.2 kg Height:  '5\' 7"'$  (170.2 cm)  BEHAVIORAL SYMPTOMS/MOOD NEUROLOGICAL BOWEL NUTRITION STATUS      Continent Diet (see dc summary)  AMBULATORY STATUS COMMUNICATION OF NEEDS Skin   Extensive Assist Verbally Normal                       Personal Care Assistance Level of Assistance  Bathing, Feeding, Dressing Bathing Assistance: Maximum assistance Feeding assistance: Independent Dressing Assistance: Limited assistance     Functional Limitations Info             SPECIAL CARE FACTORS FREQUENCY  PT (By licensed PT), OT (By licensed OT)     PT Frequency: 5 times per week OT Frequency: 5 times per week            Contractures Contractures Info: Not present    Additional Factors Info  Code Status, Allergies Code Status Info: full code Allergies Info: Augmentin           Current Medications (12/14/2021):  This is the current hospital active medication list Current Facility-Administered Medications  Medication Dose Route Frequency Provider Last Rate Last Admin   acetaminophen (TYLENOL) tablet 650 mg  650 mg Oral Q8H Hessie Knows, MD   650 mg at 12/14/21 3159   aspirin EC tablet 81 mg  81 mg Oral q morning Myles Rosenthal A, MD   81 mg at 12/14/21 0853   busPIRone (BUSPAR) tablet 10 mg  10 mg Oral BID Clance Boll, MD   10 mg at  12/14/21 0853   cholecalciferol (VITAMIN D3) tablet 1,000 Units  1,000 Units Oral q morning Myles Rosenthal A, MD   1,000 Units at 12/14/21 0853   docusate sodium (COLACE) capsule 100 mg  100 mg Oral BID Myles Rosenthal A, MD   100 mg at 12/14/21 0853   enoxaparin (LOVENOX) injection 40 mg  40 mg Subcutaneous Q24H Myles Rosenthal A, MD   40 mg at 12/13/21 2214   famotidine (PEPCID) tablet 20 mg  20 mg Oral QHS Myles Rosenthal A, MD   20 mg at 12/13/21 2213   latanoprost (XALATAN) 0.005 %  ophthalmic solution 1 drop  1 drop Both Eyes QHS Wieting, Richard, MD       lip balm (BLISTEX) ointment   Topical PRN Hessie Knows, MD       ondansetron [facility]) tablet 4 mg  4 mg Oral Q6H PRN Clance Boll, MD       Or   ondansetron [facility]) injection 4 mg  4 mg Intravenous Q6H PRN Clance Boll, MD       oxyCODONE (Oxy IR/ROXICODONE) immediate release tablet 5 mg  5 mg Oral Q4H PRN Hessie Knows, MD       pantoprazole (PROTONIX) EC tablet 40 mg  40 mg Oral q morning Myles Rosenthal A, MD   40 mg at 12/14/21 8185   QUEtiapine (SEROQUEL) tablet 100 mg  100 mg Oral QHS Myles Rosenthal A, MD   100 mg at 12/13/21 2213   simvastatin (ZOCOR) tablet 20 mg  20 mg Oral QHS Myles Rosenthal A, MD   20 mg at 12/13/21 2213   tamsulosin (FLOMAX) capsule 0.4 mg  0.4 mg Oral q morning Myles Rosenthal A, MD   0.4 mg at 12/14/21 0853   traMADol (ULTRAM) tablet 50 mg  50 mg Oral Q8H PRN Clance Boll, MD   50 mg at 12/14/21 6314     Discharge Medications: Please see discharge summary for a list of discharge medications.  Relevant Imaging Results:  Relevant Lab Results:   Additional Information SS# [id number]  Conception Oms, RN

## 2021-12-14 NOTE — TOC Progression Note (Signed)
Transition of Care (TOC) - Progression Note  ? ? ?Patient Details  ?Name: David Myers ?MRN: [id number] ?Date of Birth: [birth year] ? ?Transition of Care (TOC) CM/SW Contact  ?Conception Oms, RN ?Phone Number: ?12/14/2021, 11:04 AM ? ?Clinical Narrative:   PASSR pending ? ? ? ?Expected Discharge Plan: [place] ?Barriers to Discharge: Continued Medical Work up ? ?Expected Discharge Plan and Services ?Expected Discharge Plan: [place] ?  ?Discharge Planning Services: CM Consult ?  ?Living arrangements for the past 2 months: [place] ?                ?DME Arranged: N/A ?  ?  ?  ?  ?HH Arranged: PT ?[place] Agency: [place] ?Date HH Agency Contacted: 12/14/21 ?Time [place]: 5027 ?Representative spoke with at [place]: [place] ? ? ?Social Determinants of Health (SDOH) Interventions ?  ? ?Readmission Risk Interventions ?   ? View : No data to display.  ?  ?  ?  ? ? ?

## 2021-12-14 NOTE — TOC Progression Note (Signed)
Transition of Care (TOC) - Progression Note  ? ? ?Patient Details  ?Name: David Myers ?MRN: [id number] ?Date of Birth: 04-18-1932 ? ?Transition of Care (TOC) CM/SW Contact  ?Conception Oms, RN ?Phone Number: ?12/14/2021, 12:15 PM ? ?Clinical Narrative:    ? ?[place] [facility] accepted the patient if he gets the Covid booster, the patient is agreeable to get the booster, HTA called and started ins auth ? ?Expected Discharge Plan: [place] ?Barriers to Discharge: Continued Medical Work up ? ?Expected Discharge Plan and Services ?Expected Discharge Plan: [place] ?  ?Discharge Planning Services: CM Consult ?  ?Living arrangements for the past 2 months: [place] ?                ?DME Arranged: N/A ?  ?  ?  ?  ?HH Arranged: PT ?[place] Agency: [place] ?Date HH Agency Contacted: 12/14/21 ?Time [place]: 4098 ?Representative spoke with at [place]: [place] ? ? ?Social Determinants of Health (SDOH) Interventions ?  ? ?Readmission Risk Interventions ?   ? View : No data to display.  ?  ?  ?  ? ? ?

## 2021-12-14 NOTE — Progress Notes (Signed)
SATURATION QUALIFICATIONS: (This note is used to comply with regulatory documentation for home oxygen) ? ?Patient Saturations on Room Air at Rest = 85% ? ?Patient Saturations on Room Air while Ambulating = pt was resting ? ?Patient Saturations on 0 Liters of oxygen while Ambulating  did not ambulate with 02 ? ?Please briefly explain why patient needs home oxygen  85% resting ?

## 2021-12-15 DIAGNOSIS — J9601 Acute respiratory failure with hypoxia: Secondary | ICD-10-CM | POA: Diagnosis not present

## 2021-12-15 MED ORDER — BISACODYL 5 MG PO TBEC
5.0000 mg | DELAYED_RELEASE_TABLET | Freq: Every day | ORAL | Status: DC | PRN
  Administered 2021-12-15: 5 mg via ORAL
  Filled 2021-12-15: qty 1

## 2021-12-15 MED ORDER — SENNOSIDES-DOCUSATE SODIUM 8.6-50 MG PO TABS
1.0000 | ORAL_TABLET | Freq: Two times a day (BID) | ORAL | Status: DC
  Administered 2021-12-15 – 2021-12-16 (×2): 1 via ORAL
  Filled 2021-12-15 (×3): qty 1

## 2021-12-15 MED ORDER — BISACODYL 10 MG RE SUPP
10.0000 mg | Freq: Every day | RECTAL | Status: DC | PRN
  Administered 2021-12-15: 10 mg via RECTAL
  Filled 2021-12-15: qty 1

## 2021-12-15 MED ORDER — POLYETHYLENE GLYCOL 3350 17 G PO PACK
17.0000 g | PACK | Freq: Every day | ORAL | Status: DC
  Administered 2021-12-15: 17 g via ORAL
  Filled 2021-12-15 (×2): qty 1

## 2021-12-15 NOTE — Progress Notes (Signed)
Physical Therapy Treatment ?Patient Details ?Name: David Myers ?MRN: [id number] ?DOB: [birth year] ?Today's Date: 12/15/2021 ? ? ?History of Present Illness The pt is an 88 y.o. 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 imaging showed "Multiple acute pelvic fractures including bilateral anterior acetabular fractures, fractures of the right superior and inferior pubic rami, and questionable fracture in the left sacrum. No significant displacement." Per orthopedic consult WBAT. ? ?  ?PT Comments  ? ? Patient alert, oriented, joking with PT for most of session. Initially reported 6/10 pain, but after session stated 7-8/10. Family/pt educated on pain management. The patient was able to transfer to EOB today with modA, extra time, and reliance on bed rails with HOB very elevated. Pt able to initiate all movements but did need assist for BLE and anterior scoot. Sit <> stand from EOB modAx2 with RW, modAx2 for several steps to the recliner. From recliner sit <> stand maxA, and able to stand ~3 minutes prior to needing to sit. Cues for upright posture and breathing. Pt up in recliner at end of session, all needs in reach. The patient would benefit from further skilled PT intervention to continue to progress towards goals. Recommendation remains appropriate.  ? ? ?   ?Recommendations for follow up therapy are one component of a multi-disciplinary discharge planning process, led by the attending physician.  Recommendations may be updated based on patient status, additional functional criteria and insurance authorization. ? ?Follow Up Recommendations ? Skilled nursing-short term rehab (<3 hours/day) ?  ?  ?Assistance Recommended at Discharge Frequent or constant Supervision/Assistance  ?Patient can return home with the following Two people to help with walking and/or  transfers;Two people to help with bathing/dressing/bathroom;Direct supervision/assist for medications management;Help with stairs or ramp for entrance;Assist for transportation;Assistance with feeding;Assistance with cooking/housework ?  ?Equipment Recommendations ? Rolling walker (2 wheels);BSC/3in1;Wheelchair (measurements PT);Wheelchair cushion (measurements PT);Hospital bed;Other (comment)  ?  ?Recommendations for Other Services   ? ? ?  ?Precautions / Restrictions Precautions ?Precautions: Fall ?Restrictions ?Weight Bearing Restrictions: Yes ?RLE Weight Bearing: Weight bearing as tolerated ?LLE Weight Bearing: Weight bearing as tolerated  ?  ? ?Mobility ? Bed Mobility ?Overal bed mobility: Needs Assistance ?Bed Mobility: Supine to Sit ?  ?  ?Supine to sit: Mod assist ?  ?  ?General bed mobility comments: extended time, reliance on bed rails, modA for BLE assist and weight shift anteriorly ?  ? ?Transfers ?Overall transfer level: Needs assistance ?Equipment used: Rolling walker (2 wheels) ?Transfers: Sit to/from Stand ?Sit to Stand: +2 safety/equipment, Mod assist, Max assist ?  ?  ?  ?  ?  ?General transfer comment: from EOB modAX2, from recliner, maxAx1 ?  ? ?Ambulation/Gait ?  ?  ?  ?  ?  ?  ?  ?  ? ? ?Stairs ?  ?  ?  ?  ?  ? ? ?Wheelchair Mobility ?  ? ?Modified Rankin (Stroke Patients Only) ?  ? ? ?  ?Balance Overall balance assessment: Needs assistance ?Sitting-balance support: No upper extremity supported, Feet supported ?Sitting balance-Leahy Scale: Good ?Sitting balance - Comments: pt with multiple attempts to scoot anteriorly with some success ?  ?Standing balance support: Bilateral upper extremity supported, During functional activity, Reliant on assistive device for balance ?Standing balance-Leahy Scale: Poor ?Standing balance comment: vc's for upright posture in standing ?  ?  ?  ?  ?  ?  ?  ?  ?  ?  ?  ?  ? ?  ?  Cognition Arousal/Alertness: Awake/alert ?Behavior During Therapy: [facility] for tasks  assessed/performed ?  ?  ?  ?  ?  ?  ?  ?  ?  ?  ?  ?  ?  ?  ?  ?  ?  ?General Comments: pt HOH, some repetition of commands needed ?  ?  ? ?  ?Exercises Other Exercises ?Other Exercises: AAROM heel slides BLE x5 in bed, seated LAQ BLE x5 AROM, ankle pumps x10 in sitting ? ?  ?General Comments   ?  ?  ? ?Pertinent Vitals/Pain Pain Assessment ?Pain Assessment: 0-10 ?Pain Score: 8  ?Pain Location: pelvis ?Pain Descriptors / Indicators: Grimacing, Guarding, Discomfort, Moaning ?Pain Intervention(s): Limited activity within patient's tolerance, Monitored during session, Repositioned, Premedicated before session  ? ? ?Home Living   ?  ?  ?  ?  ?  ?  ?  ?  ?  ?   ?  ?Prior Function    ?  ?  ?   ? ?PT Goals (current goals can now be found in the care plan section) Progress towards PT goals: Progressing toward goals ? ?  ?Frequency ? ? ? 7X/week ? ? ? ?  ?PT Plan Current plan remains appropriate  ? ? ?Co-evaluation   ?  ?  ?  ?  ? ?  ?AM-PAC PT "6 Clicks" Mobility   ?Outcome Measure ? Help needed turning from your back to your side while in a flat bed without using bedrails?: Total ?Help needed moving from lying on your back to sitting on the side of a flat bed without using bedrails?: Total ?Help needed moving to and from a bed to a chair (including a wheelchair)?: Total ?Help needed standing up from a chair using your arms (e.g., wheelchair or bedside chair)?: Total ?Help needed to walk in hospital room?: Total ?Help needed climbing 3-5 steps with a railing? : Total ?6 Click Score: 6 ? ?  ?End of Session Equipment Utilized During Treatment: Gait belt ?Activity Tolerance: Patient limited by pain ?Patient left: in chair;with call bell/phone within reach;with chair alarm set;with family/visitor present ?Nurse Communication: Mobility status ?PT Visit Diagnosis: Other abnormalities of gait and mobility (R26.89);Difficulty in walking, not elsewhere classified (R26.2);Muscle weakness (generalized) (M62.81);Pain ?Pain -  Right/Left:  (B) ?Pain - part of body:  (pelvis) ?  ? ? ?Time: 3343-5686 ?PT Time Calculation (min) (ACUTE ONLY): 29 min ? ?Charges:  $Therapeutic Activity: 23-37 mins          ?          ? ?Lieutenant Diego PT, DPT ?10:04 AM,12/15/21 ? ? ?

## 2021-12-15 NOTE — TOC Progression Note (Signed)
Transition of Care (TOC) - Progression Note  ? ? ?Patient Details  ?Name: David Myers ?MRN: [id number] ?Date of Birth: 06-23-1932 ? ?Transition of Care (TOC) CM/SW Contact  ?Karyssa Amaral A Ayanna Gheen, LCSW ?Phone Number: ?12/15/2021, 11:05 AM ? ?Clinical Narrative:   CSW got [place] for [facility] yesterday. Per MD pending bm pt can dc today or tomorrow.Pt's spouse aware as well. SNF aware. ? ? ? ?Expected Discharge Plan: [place] ?Barriers to Discharge: Continued Medical Work up ? ?Expected Discharge Plan and Services ?Expected Discharge Plan: [place] ?  ?Discharge Planning Services: CM Consult ?  ?Living arrangements for the past 2 months: [place] ?                ?DME Arranged: N/A ?  ?  ?  ?  ?HH Arranged: PT ?[place] Agency: [place] ?Date HH Agency Contacted: 12/14/21 ?Time [place]: 5366 ?Representative spoke with at [place]: [place] ? ? ?Social Determinants of Health (SDOH) Interventions ?  ? ?Readmission Risk Interventions ?   ? View : No data to display.  ?  ?  ?  ? ? ?

## 2021-12-15 NOTE — Plan of Care (Signed)
  Problem: Elimination: Goal: Will not experience complications related to bowel motility Outcome: Progressing Goal: Will not experience complications related to urinary retention Outcome: Progressing   

## 2021-12-15 NOTE — TOC Progression Note (Signed)
Transition of Care (TOC) - Progression Note  ? ? ?Patient Details  ?Name: David Myers ?MRN: [id number] ?Date of Birth: 08-Feb-1932 ? ?Transition of Care (TOC) CM/SW Contact  ?Conception Oms, RN ?Phone Number: ?12/15/2021, 12:13 PM ? ?Clinical Narrative:   Uploasded the notes to PASSR to get PASSR number, PASSR still pending ? ? ? ?Expected Discharge Plan: [place] ?Barriers to Discharge: Continued Medical Work up ? ?Expected Discharge Plan and Services ?Expected Discharge Plan: [place] ?  ?Discharge Planning Services: CM Consult ?  ?Living arrangements for the past 2 months: [place] ?                ?DME Arranged: N/A ?  ?  ?  ?  ?HH Arranged: PT ?[place] Agency: [place] ?Date HH Agency Contacted: 12/14/21 ?Time [place]: 6568 ?Representative spoke with at [place]: [place] ? ? ?Social Determinants of Health (SDOH) Interventions ?  ? ?Readmission Risk Interventions ?   ? View : No data to display.  ?  ?  ?  ? ? ?

## 2021-12-15 NOTE — Care Management Important Message (Signed)
Important Message ? ?Patient Details  ?Name: David Myers ?MRN: [id number] ?Date of Birth: 09-09-1931 ? ? ?Medicare Important Message Given:  Yes ? ? ? ? ?Juliann Pulse A Clotine Heiner ?12/15/2021, 11:45 AM ?

## 2021-12-15 NOTE — Progress Notes (Signed)
?Progress Note ? ? ?Patient: David Myers JKK:[id number] DOB: 1932-05-05 DOA: 12/12/2021     3 ?DOS: the patient was seen and examined on 12/15/2021 ?  ?Brief hospital course: ?88 year old man with past medical history of type 2 diabetes mellitus, GERD, hypertension, obstructive sleep apnea noncompliant with CPAP, CVA, dementia.  He had a fall and found to have numerous pelvic fractures.  CT scan showing bilateral anterior acetabular fractures, fractures of the right superior and inferior pubic rami and questionable fracture of the left sacrum.  Patient also found to have an elevated D-dimer and hypoxic.  Ultrasound of the lower extremities negative and perfusion study negative. ? ?Looking back at prior CAT scan of the abdomen it does seem that he does have interstitial lung disease at the bases, possibly sequelae of Covid-19 pneumonia.   ? ?Pulse ox of 85% at rest on room air, qualifies for home oxygen. ? ?Assessment and Plan: ?* Acute respiratory failure with hypoxia ([place]) ?Patient had a pulse ox of 83% on room air on presentation.  Perfusion scan negative for pulmonary embolism.  Imaging concerning for underlying interstitial lung disease.  He did have COVID-19 infection previously, may have ILD as sequelae.   ?Pulse ox 85% on room air, qualifies for home O2 at discharge ?--Supplemental O2 as needed to maintain sats above 90%, wean as tolerated. ?--3 L/min nocturnal O2 given underlying sleep apnea and does not tolerate CPAP ? ?Pelvis fracture ([place]) ?Bilateral acetabulum fracture right superior and inferior pubic rami fracture and a questionable left sacral fracture.  Pain control.  Physical therapy evaluation recommending rehab. ?-- Continue scheduled Tylenol and tramadol as needed ? ?Constipation -possibly related to pain medication.  Bowel regimen ordered including Dulcolax suppository today. ? ? ? ?Interstitial lung disease ([place]) ?Possibly sequelae of Covid-19 PNA. ?Now qualifies for home O2. ?Pulmonology  referral or follow-up ? ?Elevated d-dimer ?Sonogram of the lower extremity negative for DVT.  Perfusion study negative for PE.  D-dimer could be elevated with interstitial lung disease. ? ?Obstructive sleep apnea ?Tried and did not tolerate CPAP previously. ?Desaturated at night on 2 L.   ?Recommend 2 L/min during day, and 3 L/min oxygen at night. ? ?Type 2 diabetes mellitus with hyperlipidemia ([place]) ?On Actos at home which is held here. ?Continue Zocor.   ? ?Fasting glucose on labs is at goal in the lower 100s.  Start sliding scale insulin coverage if fasting sugars above 180. ? ?Acute kidney injury superimposed on CKD ([place]) ?AKI on CKD stage IIIa.  creatinine 1.71 on presentation and came down to 1.19 today with IV fluids.  ? ?Vascular dementia without behavioral disturbance ([place]) ?Continue aspirin.   ?On Aricept as outpatient Seroquel and BuSpar -continued. ? ? ? ? ?  ? ?Subjective: Patient up in recliner with wife and daughter at bedside on rounds this morning.  He had just finished working with PT.  He reports ongoing hip and pelvic pain secondary to his fractures.  Currently pain is 5 out of 10 in severity.  He has not had a bowel movement apparently since admission despite stool softeners.  Patient offers no other acute complaints at this time.  No acute was reported. ? ?Physical Exam: ?Vitals:  ? 12/15/21 0809 12/15/21 0825 12/15/21 1149 12/15/21 1539  ?BP: 116/65  136/71   ?Pulse: 79  87   ?Resp: 19  20   ?Temp: 97.7 ?F (36.5 ?C)     ?TempSrc:      ?SpO2: 93% 91% 94% 91%  ?Weight:      ?  Height:      ? ?General exam: awake, alert, no acute distress ?HEENT: moist mucus membranes, hearing grossly normal  ?Respiratory system: CTAB, no wheezes, rales or rhonchi, normal respiratory effort. ?Cardiovascular system: normal S1/S2, RRR, no pedal edema.   ?Gastrointestinal system: soft, NT, ND, no HSM felt, +bowel sounds. ?Central nervous system: Alert, oriented to self and hospital. no gross focal neurologic deficits,  normal speech ?Extremities: moves all, no cyanosis, normal tone ?Skin: dry, intact, normal temperature ?Psychiatry: normal mood, congruent affect, minimally talkative unable to assess judgment and insight at this time ? ? ?Data Reviewed: ? ?Notable labs: Glucose 127, calcium 8.4, GFR 59, hemoglobin 10.1, platelets 108 ? ?Family Communication: Wife and daughter at bedside on rounds today ? ?Disposition: ?Status is: Inpatient ?Remains inpatient appropriate because: Patient requires SNF placement for rehab, medically stable for discharge but has not had a BM since admission, anticipate discharge to SNF tomorrow if patient has BMs. ? ? Planned Discharge Destination: Skilled nursing facility ? ? ? ?Time spent: 40 minutes ? ?Author: ?Ezekiel Slocumb, DO ?12/15/2021 3:53 PM ? ?For on call review [email or web address].  ?

## 2021-12-15 NOTE — Assessment & Plan Note (Addendum)
Possibly sequelae of Covid-19 PNA. ?Now qualifies for home O2. ?Pulmonology referral or follow-up ?

## 2021-12-15 NOTE — Progress Notes (Signed)
Occupational Therapy Treatment ?Patient Details ?Name: David Myers ?MRN: [id number] ?DOB: 1932-03-25 ?Today's Date: 12/15/2021 ? ? ?History of present illness The pt is an 88 y.o. 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 imaging showed "Multiple acute pelvic fractures including bilateral anterior acetabular fractures, fractures of the right superior and inferior pubic rami, and questionable fracture in the left sacrum. No significant displacement." Per orthopedic consult [facility]. ?  ?OT comments ? Chart reviewed, RN requested assist with transfer back to bed on this date. Pt endorses fatigue from sitting up in the chair for approx 5 hours. STS completed with MOD A +1 with RW, step by step vcs, step pivot transfer to bed with MOD A with RW +2, MAX A +1 required for sit>supine with improvements noted in BLE assist on this date. Pt is left in bed in care of RN, NAD, all needs met. Discharge recommendation remains appropriate. OT will continue to follow acutely.   ? ?Recommendations for follow up therapy are one component of a multi-disciplinary discharge planning process, led by the attending physician.  Recommendations may be updated based on patient status, additional functional criteria and insurance authorization. ?   ?Follow Up Recommendations ? Skilled nursing-short term rehab (<3 hours/day)  ?  ?Assistance Recommended at Discharge Frequent or constant Supervision/Assistance  ?Patient can return home with the following ? A lot of help with walking and/or transfers;A lot of help with bathing/dressing/bathroom;Assistance with cooking/housework;Direct supervision/assist for financial management;Direct supervision/assist for medications management;Help with stairs or ramp for entrance;Assist for transportation ?  ?Equipment Recommendations ? BSC/3in1;Tub/shower seat   ?  ?Recommendations for Other Services   ? ?  ?Precautions / Restrictions Precautions ?Precautions: Fall ?Restrictions ?Weight Bearing Restrictions: Yes ?RLE Weight Bearing: Weight bearing as tolerated ?LLE Weight Bearing: Weight bearing as tolerated  ? ? ?  ? ?Mobility Bed Mobility ?Overal bed mobility: Needs Assistance ?Bed Mobility: Sit to Supine ?  ?  ?  ?Sit to supine: Max assist, HOB elevated ?  ?General bed mobility comments: increased ability to assist with LEs back to bed as compared to yesterdays eval ?  ? ?Transfers ?Overall transfer level: Needs assistance ?Equipment used: Rolling walker (2 wheels) ?Transfers: Sit to/from Stand ?Sit to Stand: Mod assist ?  ?  ?  ?  ?  ?  ?  ?  ?Balance Overall balance assessment: Needs assistance ?Sitting-balance support: No upper extremity supported, Feet supported ?Sitting balance-Leahy Scale: Good ?  ?  ?Standing balance support: Bilateral upper extremity supported, During functional activity, Reliant on assistive device for balance ?Standing balance-Leahy Scale: Poor ?  ?  ?  ?  ?  ?  ?  ?  ?  ?  ?  ?  ?   ? ?ADL either performed or assessed with clinical judgement  ? ?ADL Overall ADL's : Needs assistance/impaired ?  ?  ?  ?  ?  ?  ?  ?  ?  ?  ?  ?  ?Toilet Transfer: Moderate assistance;Rolling walker (2 wheels) ?Toilet Transfer Details (indicate cue type and reason): simulated, step pivot from bedside chair to bed ?  ?  ?  ?  ?  ?  ?  ? ?Extremity/Trunk Assessment   ?  ?  ?  ?  ?  ? ?Vision   ?  ?  ?Perception   ?  ?Praxis   ?  ? ?  Cognition Arousal/Alertness: Awake/alert ?Behavior During Therapy: [facility] for tasks assessed/performed ?Overall Cognitive Status: Impaired/Different from baseline ?Area of Impairment: Attention, Following commands, Awareness, Problem solving ?  ?  ?  ?  ?  ?  ?  ?  ?  ?Current Attention Level: Selective ?  ?Following Commands: Follows multi-step commands inconsistently, Follows one step commands with increased time ?  ?Awareness:  Emergent ?Problem Solving: Difficulty sequencing, Requires verbal cues, Requires tactile cues ?  ?  ?  ?   ?Exercises   ? ?  ?Shoulder Instructions   ? ? ?  ?General Comments    ? ? ?Pertinent Vitals/ Pain       Pain Assessment ?Pain Assessment: Faces ?Faces Pain Scale: Hurts whole lot ?Pain Location: pelvis ?Pain Descriptors / Indicators: Grimacing, Guarding, Discomfort, Moaning ?Pain Intervention(s): Limited activity within patient's tolerance, Monitored during session, Patient requesting pain meds-RN notified, Repositioned ? ?Home Living   ?  ?  ?  ?  ?  ?  ?  ?  ?  ?  ?  ?  ?  ?  ?  ?  ?  ?  ? ?  ?Prior Functioning/Environment    ?  ?  ?  ?   ? ?Frequency ? Min 2X/week  ? ? ? ? ?  ?Progress Toward Goals ? ?OT Goals(current goals can now be found in the care plan section) ? Progress towards OT goals: Progressing toward goals ? ?   ?Plan Discharge plan remains appropriate   ? ?Co-evaluation ? ? ?   ?  ?  ?  ?  ? ?  ?AM-PAC OT "6 Clicks" Daily Activity     ?Outcome Measure ? ?   ?Help from another person taking care of personal grooming?: A Little ?Help from another person toileting, which includes using toliet, bedpan, or urinal?: A Lot ?Help from another person bathing (including washing, rinsing, drying)?: A Lot ?Help from another person to put on and taking off regular upper body clothing?: A Little ?Help from another person to put on and taking off regular lower body clothing?: A Lot ?6 Click Score: 12 ? ?  ?End of Session Equipment Utilized During Treatment: Gait belt;Rolling walker (2 wheels);Oxygen ? ?OT Visit Diagnosis: Unsteadiness on feet (R26.81);History of falling (Z91.81);Muscle weakness (generalized) (M62.81) ?  ?Activity Tolerance Patient tolerated treatment well ?  ?Patient Left in bed;with call bell/phone within reach;with bed alarm set ?  ?Nurse Communication Mobility status ?  ? ?   ? ?Time: 8485-9276 ?OT Time Calculation (min): 12 min ? ?Charges: OT General Charges ?$OT Visit: 1 Visit ?OT  Treatments ?$Therapeutic Activity: 8-22 mins ? ?Shanon Payor, OTD OTR/L  ?12/15/21, 3:46 PM  ?

## 2021-12-16 ENCOUNTER — Encounter: Payer: Self-pay | Admitting: Internal Medicine

## 2021-12-16 DIAGNOSIS — J849 Interstitial pulmonary disease, unspecified: Secondary | ICD-10-CM | POA: Diagnosis not present

## 2021-12-16 DIAGNOSIS — G4733 Obstructive sleep apnea (adult) (pediatric): Secondary | ICD-10-CM | POA: Diagnosis not present

## 2021-12-16 DIAGNOSIS — J9611 Chronic respiratory failure with hypoxia: Secondary | ICD-10-CM | POA: Diagnosis not present

## 2021-12-16 DIAGNOSIS — S32591D Other specified fracture of right pubis, subsequent encounter for fracture with routine healing: Secondary | ICD-10-CM | POA: Diagnosis not present

## 2021-12-16 DIAGNOSIS — R4189 Other symptoms and signs involving cognitive functions and awareness: Secondary | ICD-10-CM | POA: Diagnosis not present

## 2021-12-16 DIAGNOSIS — F419 Anxiety disorder, unspecified: Secondary | ICD-10-CM | POA: Diagnosis not present

## 2021-12-16 DIAGNOSIS — R5381 Other malaise: Secondary | ICD-10-CM | POA: Diagnosis not present

## 2021-12-16 DIAGNOSIS — S32511D Fracture of superior rim of right pubis, subsequent encounter for fracture with routine healing: Secondary | ICD-10-CM | POA: Diagnosis not present

## 2021-12-16 DIAGNOSIS — G301 Alzheimer's disease with late onset: Secondary | ICD-10-CM | POA: Diagnosis not present

## 2021-12-16 DIAGNOSIS — S32591A Other specified fracture of right pubis, initial encounter for closed fracture: Secondary | ICD-10-CM | POA: Diagnosis not present

## 2021-12-16 DIAGNOSIS — E1122 Type 2 diabetes mellitus with diabetic chronic kidney disease: Secondary | ICD-10-CM | POA: Diagnosis not present

## 2021-12-16 DIAGNOSIS — Z9981 Dependence on supplemental oxygen: Secondary | ICD-10-CM | POA: Diagnosis not present

## 2021-12-16 DIAGNOSIS — G309 Alzheimer's disease, unspecified: Secondary | ICD-10-CM | POA: Diagnosis not present

## 2021-12-16 DIAGNOSIS — D696 Thrombocytopenia, unspecified: Secondary | ICD-10-CM | POA: Diagnosis not present

## 2021-12-16 DIAGNOSIS — M6281 Muscle weakness (generalized): Secondary | ICD-10-CM | POA: Diagnosis not present

## 2021-12-16 DIAGNOSIS — S32401D Unspecified fracture of right acetabulum, subsequent encounter for fracture with routine healing: Secondary | ICD-10-CM | POA: Diagnosis not present

## 2021-12-16 DIAGNOSIS — R29898 Other symptoms and signs involving the musculoskeletal system: Secondary | ICD-10-CM | POA: Diagnosis not present

## 2021-12-16 DIAGNOSIS — S32402D Unspecified fracture of left acetabulum, subsequent encounter for fracture with routine healing: Secondary | ICD-10-CM | POA: Diagnosis not present

## 2021-12-16 DIAGNOSIS — E559 Vitamin D deficiency, unspecified: Secondary | ICD-10-CM | POA: Diagnosis not present

## 2021-12-16 DIAGNOSIS — F02A0 Dementia in other diseases classified elsewhere, mild, without behavioral disturbance, psychotic disturbance, mood disturbance, and anxiety: Secondary | ICD-10-CM | POA: Diagnosis not present

## 2021-12-16 DIAGNOSIS — Z9181 History of falling: Secondary | ICD-10-CM | POA: Diagnosis not present

## 2021-12-16 DIAGNOSIS — E1159 Type 2 diabetes mellitus with other circulatory complications: Secondary | ICD-10-CM | POA: Diagnosis not present

## 2021-12-16 DIAGNOSIS — R278 Other lack of coordination: Secondary | ICD-10-CM | POA: Diagnosis not present

## 2021-12-16 DIAGNOSIS — Z743 Need for continuous supervision: Secondary | ICD-10-CM | POA: Diagnosis not present

## 2021-12-16 DIAGNOSIS — F0150 Vascular dementia without behavioral disturbance: Secondary | ICD-10-CM | POA: Diagnosis not present

## 2021-12-16 DIAGNOSIS — Z741 Need for assistance with personal care: Secondary | ICD-10-CM | POA: Diagnosis not present

## 2021-12-16 DIAGNOSIS — F32A Depression, unspecified: Secondary | ICD-10-CM | POA: Diagnosis not present

## 2021-12-16 DIAGNOSIS — N1831 Chronic kidney disease, stage 3a: Secondary | ICD-10-CM | POA: Diagnosis not present

## 2021-12-16 DIAGNOSIS — N400 Enlarged prostate without lower urinary tract symptoms: Secondary | ICD-10-CM | POA: Diagnosis not present

## 2021-12-16 DIAGNOSIS — J9601 Acute respiratory failure with hypoxia: Secondary | ICD-10-CM | POA: Diagnosis not present

## 2021-12-16 DIAGNOSIS — K219 Gastro-esophageal reflux disease without esophagitis: Secondary | ICD-10-CM | POA: Diagnosis not present

## 2021-12-16 DIAGNOSIS — F01A0 Vascular dementia, mild, without behavioral disturbance, psychotic disturbance, mood disturbance, and anxiety: Secondary | ICD-10-CM | POA: Diagnosis not present

## 2021-12-16 DIAGNOSIS — R2689 Other abnormalities of gait and mobility: Secondary | ICD-10-CM | POA: Diagnosis not present

## 2021-12-16 DIAGNOSIS — S329XXA Fracture of unspecified parts of lumbosacral spine and pelvis, initial encounter for closed fracture: Secondary | ICD-10-CM | POA: Diagnosis not present

## 2021-12-16 LAB — CBC
HCT: 29.5 % — ABNORMAL LOW (ref 39.0–52.0)
Hemoglobin: 9.8 g/dL — ABNORMAL LOW (ref 13.0–17.0)
MCH: 31.1 pg (ref 26.0–34.0)
MCHC: 33.2 g/dL (ref 30.0–36.0)
MCV: 93.7 fL (ref 80.0–100.0)
Platelets: 105 10*3/uL — ABNORMAL LOW (ref 150–400)
RBC: 3.15 MIL/uL — ABNORMAL LOW (ref 4.22–5.81)
RDW: 13.4 % (ref 11.5–15.5)
WBC: 6 10*3/uL (ref 4.0–10.5)
nRBC: 0 % (ref 0.0–0.2)

## 2021-12-16 MED ORDER — TRAMADOL HCL 50 MG PO TABS: 50.0000 mg | ORAL_TABLET | Freq: Three times a day (TID) | ORAL | 0 refills | Status: DC | PRN

## 2021-12-16 MED ORDER — POLYETHYLENE GLYCOL 3350 17 G PO PACK
17.0000 g | PACK | Freq: Every day | ORAL | 0 refills | Status: DC
Start: 2021-12-17 — End: 2023-02-01

## 2021-12-16 MED ORDER — BISACODYL 5 MG PO TBEC: 5.0000 mg | DELAYED_RELEASE_TABLET | Freq: Every day | ORAL | 0 refills | Status: DC | PRN

## 2021-12-16 MED ORDER — SENNOSIDES-DOCUSATE SODIUM 8.6-50 MG PO TABS: 1.0000 | ORAL_TABLET | Freq: Every evening | ORAL | Status: DC | PRN

## 2021-12-16 MED ORDER — BLISTEX MEDICATED EX OINT: 1.0000 "application " | TOPICAL_OINTMENT | CUTANEOUS | Status: DC | PRN

## 2021-12-16 MED ORDER — ONDANSETRON HCL 4 MG PO TABS: 4.0000 mg | ORAL_TABLET | Freq: Four times a day (QID) | ORAL | 0 refills | Status: DC | PRN

## 2021-12-16 MED ORDER — BISACODYL 10 MG RE SUPP: 10.0000 mg | Freq: Every day | RECTAL | 0 refills | Status: DC | PRN

## 2021-12-16 MED ORDER — FAMOTIDINE 20 MG PO TABS: 20.0000 mg | ORAL_TABLET | Freq: Every day | ORAL | Status: DC

## 2021-12-16 MED ORDER — ACETAMINOPHEN 325 MG PO TABS: 1000.0000 mg | ORAL_TABLET | Freq: Three times a day (TID) | ORAL | Status: DC

## 2021-12-16 NOTE — Discharge Summary (Signed)
Physician Discharge Summary   Patient: David Myers MRN: [id number] DOB: [birth year]  Admit date:     12/12/2021  Discharge date: 12/16/21  Discharge Physician: Ezekiel Slocumb   PCP: Rusty Aus, MD   Recommendations at discharge:   Follow up with Primary Care in 1-2 weeks Repeat CBC, BMP, Mg is 1-2 weeks Weight-bearing as tolerated Continue working with PT and OT as much as possible Please give Tramadol about 1 hour BEFORE working with therapy so patient can better tolerate participation Recommend pulmonology follow-up or referral  Use 2 L/min oxygen during day, 3 L/min O2 overnight or when sleeping.  Please HUMIDIFY oxygen.  Discharge Diagnoses: Principal Problem:   Acute respiratory failure with hypoxia (HCC) Active Problems:   Pelvis fracture (HCC)   Vascular dementia without behavioral disturbance (HCC)   Type 2 diabetes mellitus with hyperlipidemia (HCC)   Obstructive sleep apnea   Elevated d-dimer   Interstitial lung disease (HCC)   Thrombocytopenia (HCC)  Resolved Problems:   Acute kidney injury superimposed on CKD [facility])  Hospital Course: 88 year old man with past medical history of type 2 diabetes mellitus, GERD, hypertension, obstructive sleep apnea noncompliant with CPAP, CVA, dementia.  He had a fall and found to have numerous pelvic fractures.  CT scan showing bilateral anterior acetabular fractures, fractures of the right superior and inferior pubic rami and questionable fracture of the left sacrum.  Patient also found to have an elevated D-dimer and hypoxic.  Ultrasound of the lower extremities negative and perfusion study negative.  Looking back at prior CAT scan of the abdomen it does seem that he does have interstitial lung disease at the bases, possibly sequelae of Covid-19 pneumonia.    Pulse ox of 85% at rest on room air, qualifies for home oxygen.  5/18: pt doing well, although in a fair amount of pain at rest (5 of 10 in severity), just given  pain medication. Constipation resolved with bowel regimen including suppository yesterday.  Pt is medically stable for d/c to rehab today.  Assessment and Plan: * Acute respiratory failure with hypoxia ([place]) Patient had a pulse ox of 83% on room air on presentation.  Perfusion scan negative for pulmonary embolism.  Imaging concerning for underlying interstitial lung disease.  He did have COVID-19 infection previously, may have ILD as sequelae.   Pulse ox 85% on room air, qualifies for home O2 at discharge --Supplemental O2 as needed to maintain sats above 90%, wean as tolerated. --3 L/min nocturnal O2 given underlying sleep apnea and does not tolerate CPAP  Pelvis fracture (HCC) Bilateral acetabulum fracture right superior and inferior pubic rami fracture and a questionable left sacral fracture.  Pain control.  Physical therapy evaluation recommending rehab. -- Continue scheduled Tylenol and tramadol as needed  Constipation - related to pain medication.  Bowel regimen ordered including Dulcolax suppository give 5/17 with good results.  Resolved. --Continue bowel regimen --Keep stools soft/loose until pelvic fracture pain has improved without use of pain medications    Thrombocytopenia (HCC) Possibly due to Lovenox. --Repeat CBC in 1-2 weeks  Interstitial lung disease ([place]) Possibly sequelae of Covid-19 PNA. Now qualifies for home O2. Pulmonology referral or follow-up  Elevated d-dimer Sonogram of the lower extremity negative for DVT.  Perfusion study negative for PE.  D-dimer could be elevated with interstitial lung disease.  Obstructive sleep apnea Tried and did not tolerate CPAP previously. Desaturated at night on 2 L.   Recommend 2 L/min during day, and 3 L/min oxygen at  night.  Type 2 diabetes mellitus with hyperlipidemia ([place]) Resume home Actos. Continue Zocor.    Covered with sliding scale insulin during admission.  Vascular dementia without behavioral disturbance  (HCC) Continue aspirin.   No longer takes Aricept. Continue Seroquel and BuSpar   Acute kidney injury superimposed on CKD (HCC)-resolved as of 12/16/2021 AKI on CKD stage IIIa.  creatinine 1.71 on presentation and came down to 1.19 with IV fluids.          Consultants: Orthopedic surgery Procedures performed: None  Disposition: Skilled nursing facility Diet recommendation:  Cardiac and Carb modified diet DISCHARGE MEDICATION: Allergies as of 12/16/2021       Reactions   Augmentin [amoxicillin-pot Clavulanate] Diarrhea   Led to C-diff         Medication List     STOP taking these medications    donepezil 5 MG tablet Commonly known as: ARICEPT       TAKE these medications    acetaminophen 325 MG tablet Commonly known as: TYLENOL Take 3 tablets (975 mg total) by mouth every 8 (eight) hours. What changed:  medication strength how much to take when to take this reasons to take this   aspirin EC 81 MG tablet Take 81 mg by mouth in the morning.   bimatoprost 0.01 % Soln Commonly known as: LUMIGAN Place 1 drop into both eyes at bedtime.   bisacodyl 5 MG EC tablet Commonly known as: DULCOLAX Take 1 tablet (5 mg total) by mouth daily as needed for moderate constipation.   bisacodyl 10 MG suppository Commonly known as: DULCOLAX Place 1 suppository (10 mg total) rectally daily as needed for moderate constipation.   busPIRone 5 MG tablet Commonly known as: BUSPAR Take 10 mg by mouth 2 (two) times daily.   famotidine 20 MG tablet Commonly known as: PEPCID Take 1 tablet (20 mg total) by mouth at bedtime. What changed:  medication strength how much to take   lip balm Oint Apply 1 application. topically as needed for lip care.   ondansetron 4 MG tablet Commonly known as: ZOFRAN Take 1 tablet (4 mg total) by mouth every 6 (six) hours as needed for nausea.   pantoprazole 40 MG tablet Commonly known as: PROTONIX Take 40 mg by mouth in the morning.    pioglitazone 15 MG tablet Commonly known as: ACTOS Take 15 mg by mouth in the morning.   polyethylene glycol 17 g packet Commonly known as: MIRALAX / GLYCOLAX Take 17 g by mouth daily. Start taking on: Dec 17, 2021   QUEtiapine 100 MG tablet Commonly known as: SEROQUEL Take 100 mg by mouth at bedtime.   senna-docusate 8.6-50 MG tablet Commonly known as: Senokot-S Take 1 tablet by mouth at bedtime as needed for mild constipation.   simvastatin 20 MG tablet Commonly known as: ZOCOR Take 20 mg by mouth at bedtime.   tamsulosin 0.4 MG Caps capsule Commonly known as: FLOMAX Take 0.4 mg by mouth every morning.   traMADol 50 MG tablet Commonly known as: ULTRAM Take 1 tablet (50 mg total) by mouth every 8 (eight) hours as needed (Pain not controlled by Tylenol).   Vitamin D3 25 MCG (1000 UT) Caps Take 1,000 Units by mouth in the morning.        Contact information for after-discharge care     Destination     [facility] .   Service: Skilled Nursing Contact information: [address] [place] [phone]  Discharge Exam: Filed Weights   12/12/21 1347 12/13/21 0500 12/15/21 0549  Weight: 79.4 kg 84.2 kg 82.1 kg   General exam: awake, alert, no acute distress but appears uncomfortable HEENT: atraumatic, clear conjunctiva, anicteric sclera, moist mucus membranes, hearing grossly normal  Respiratory system: CTAB, no wheezes, rales or rhonchi, normal respiratory effort. On 2 L/min [place] O2 Cardiovascular system: normal S1/S2,  RRR, no JVD, murmurs, rubs, gallops, no pedal edema.   Gastrointestinal system: soft, NT, ND, no HSM felt, +bowel sounds. Central nervous system: A&O x3. no gross focal neurologic deficits, normal speech Extremities: moves all, no edema, normal tone Skin: dry, intact, normal temperature Psychiatry: normal mood, congruent affect, judgement and insight appear  normal   Condition at discharge: stable  The results of significant diagnostics from this hospitalization (including imaging, microbiology, ancillary and laboratory) are listed below for reference.   Imaging Studies: DG Chest 2 View  Result Date: 12/13/2021 CLINICAL DATA:  88 year old male with history of hypoxemia. EXAM: CHEST - 2 VIEW COMPARISON:  Chest x-ray 12/12/2021. FINDINGS: Lung volumes are low. Coarse interstitial markings are evident throughout the lungs bilaterally, most severe in the mid to lower lungs. No definite confluent consolidative airspace disease. No pleural effusions. No pneumothorax. No evidence of pulmonary edema. Heart size is normal. Upper mediastinal contours are within normal limits. Atherosclerotic calcifications are noted in the thoracic aorta. IMPRESSION: 1. The appearance the chest is similar to prior examinations, concerning for interstitial lung disease. Further evaluation with high-resolution chest CT is recommended in the near future to better evaluate these findings. 2. Aortic atherosclerosis. Electronically Signed   By: Vinnie Langton M.D.   On: 12/13/2021 07:31   CT Head Wo Contrast  Result Date: 12/12/2021 CLINICAL DATA:  Head trauma EXAM: CT HEAD WITHOUT CONTRAST TECHNIQUE: Contiguous axial images were obtained from the base of the skull through the vertex without intravenous contrast. RADIATION DOSE REDUCTION: This exam was performed according to the departmental dose-optimization program which includes automated exposure control, adjustment of the mA and/or kV according to patient size and/or use of iterative reconstruction technique. COMPARISON:  CT head 05/08/2021 FINDINGS: Brain: No acute intracranial hemorrhage, mass effect, or herniation. No extra-axial fluid collections. No evidence of acute territorial infarct. No hydrocephalus. Moderate cortical volume loss. Extensive patchy hypodensities throughout the periventricular and subcortical white matter,  likely secondary to chronic microvascular ischemic changes. Vascular: Calcified plaques in the carotid siphons. Skull: Normal. Negative for fracture or focal lesion. Sinuses/Orbits: No acute finding. Other: None. IMPRESSION: Chronic changes with no acute intracranial process identified. Electronically Signed   By: Ofilia Neas M.D.   On: 12/12/2021 14:38   CT Cervical Spine Wo Contrast  Result Date: 12/12/2021 CLINICAL DATA:  Patient fell and struck right side of head. EXAM: CT CERVICAL SPINE WITHOUT CONTRAST TECHNIQUE: Multidetector CT imaging of the cervical spine was performed without intravenous contrast. Multiplanar CT image reconstructions were also generated. RADIATION DOSE REDUCTION: This exam was performed according to the departmental dose-optimization program which includes automated exposure control, adjustment of the mA and/or kV according to patient size and/or use of iterative reconstruction technique. COMPARISON:  None Available. FINDINGS: Alignment: Normal. Skull base and vertebrae: No acute fracture. No primary bone lesion or focal pathologic process. Soft tissues and spinal canal: No prevertebral fluid or swelling. No visible canal hematoma. Disc levels: Diffuse loss of disc height noted from C3-4 down to C6-7. Facets are well aligned bilaterally with degenerative changes bilaterally in the upper and mid cervical levels. Upper chest:  Unremarkable. Other: None. IMPRESSION: 1. No acute cervical spine fracture or subluxation. 2. Degenerative disc and facet disease. Electronically Signed   By: Misty Stanley M.D.   On: 12/12/2021 14:43   CT PELVIS WO CONTRAST  Result Date: 12/12/2021 CLINICAL DATA:  Trauma, fracture suspected. EXAM: CT PELVIS WITHOUT CONTRAST TECHNIQUE: Multidetector CT imaging of the pelvis was performed following the standard protocol without intravenous contrast. RADIATION DOSE REDUCTION: This exam was performed according to the departmental dose-optimization program  which includes automated exposure control, adjustment of the mA and/or kV according to patient size and/or use of iterative reconstruction technique. COMPARISON:  Hip x-ray earlier the same day. FINDINGS: Urinary Tract:  Within normal limits. Bowel:  Colonic diverticulosis. Vascular/Lymphatic: Atherosclerotic disease. No bulky lymphadenopathy identified. Reproductive:  Prostate gland normal size. Other:  Small inguinal hernias containing fat. Musculoskeletal: Acute nondisplaced fractures of the bilateral anterior acetabula. Acute nondisplaced fracture of the right parasymphyseal superior pubic ramus. Acute minimally displaced fracture of the right inferior pubic ramus. Questionable acute nondisplaced fracture at the left sacrum adjacent to the sacroiliac joint. Mild degenerative changes of the hips noted. Degenerative changes in the visualized lower lumbar spine. IMPRESSION: 1. Multiple acute pelvic fractures including bilateral anterior acetabular fractures, fractures of the right superior and inferior pubic rami, and questionable fracture in the left sacrum. No significant displacement. 2. Colonic diverticulosis and other chronic findings. Electronically Signed   By: Ofilia Neas M.D.   On: 12/12/2021 15:59   NM Pulmonary Perfusion  Result Date: 12/13/2021 CLINICAL DATA:  Suspected pulmonary embolism. EXAM: NUCLEAR MEDICINE PERFUSION LUNG SCAN TECHNIQUE: Perfusion images were obtained in multiple projections after intravenous injection of radiopharmaceutical. Ventilation scans intentionally deferred if perfusion scan and chest x-ray adequate for interpretation during COVID 19 epidemic. RADIOPHARMACEUTICALS:  4.34 mCi Tc-71mMAA IV COMPARISON:  Chest x-ray earlier the same day FINDINGS: No suspicious perfusion defects. IMPRESSION: As above. Electronically Signed   By: DOfilia NeasM.D.   On: 12/13/2021 14:11   UK[place]Venous Img Lower Bilateral (DVT)  Result Date: 12/13/2021 CLINICAL DATA:  Elevated  D-dimer EXAM: BILATERAL LOWER EXTREMITY VENOUS DOPPLER ULTRASOUND TECHNIQUE: Gray-scale sonography with compression, as well as color and duplex ultrasound, were performed to evaluate the deep venous system(s) from the level of the common femoral vein through the popliteal and proximal calf veins. COMPARISON:  None available FINDINGS: VENOUS Normal compressibility of the common femoral, superficial femoral, and popliteal veins, as well as the visualized calf veins. Visualized portions of profunda femoral vein and great saphenous vein unremarkable. No filling defects to suggest DVT on grayscale or color Doppler imaging. Doppler waveforms show normal direction of venous flow, normal respiratory plasticity and response to augmentation. OTHER None. Limitations: none IMPRESSION: No lower extremity DVT. Electronically Signed   By: FMiachel RouxM.D.   On: 12/13/2021 10:39   DG Chest Portable 1 View  Result Date: 12/12/2021 CLINICAL DATA:  Hypoxia, fell EXAM: PORTABLE CHEST 1 VIEW COMPARISON:  05/08/2021 FINDINGS: Single frontal view of the chest demonstrates a stable cardiac silhouette. No airspace disease, effusion, or pneumothorax. No acute bony abnormalities. IMPRESSION: 1. No acute intrathoracic process. Electronically Signed   By: MRanda NgoM.D.   On: 12/12/2021 16:36   DG Hip Unilat  With Pelvis 2-3 Views Right  Result Date: 12/12/2021 CLINICAL DATA:  Fall.  Right hip pain. EXAM: DG HIP (WITH OR WITHOUT PELVIS) 2-3V RIGHT COMPARISON:  None Available. FINDINGS: Frontal pelvis with AP and frog-leg lateral views of the right hip show  no evidence for femoral neck fracture. Cortical irregularity along the inferior right pubic ramus raises the question of a nondisplaced fracture, but is not definite. SI joints and symphysis pubis unremarkable. IMPRESSION: Question nondisplaced fracture of the inferior right pubic ramus. Oblique pelvis films could be used to further evaluate as clinically warranted. No femoral  neck fracture. Electronically Signed   By: Misty Stanley M.D.   On: 12/12/2021 14:55    Microbiology: Results for orders placed or performed during the hospital encounter of 05/08/21  Resp Panel by RT-PCR (Flu A&B, Covid) Nasopharyngeal Swab     Status: None   Collection Time: 05/08/21  4:11 PM   Specimen: Nasopharyngeal Swab; Nasopharyngeal(NP) swabs in vial transport medium  Result Value Ref Range Status   SARS Coronavirus 2 by RT PCR NEGATIVE NEGATIVE Final    Comment: (NOTE) SARS-CoV-2 target nucleic acids are NOT DETECTED.  The SARS-CoV-2 RNA is generally detectable in upper respiratory specimens during the acute phase of infection. The lowest concentration of SARS-CoV-2 viral copies this assay can detect is 138 copies/mL. A negative result does not preclude SARS-Cov-2 infection and should not be used as the sole basis for treatment or other patient management decisions. A negative result may occur with  improper specimen collection/handling, submission of specimen other than nasopharyngeal swab, presence of viral mutation(s) within the areas targeted by this assay, and inadequate number of viral copies(<138 copies/mL). A negative result must be combined with clinical observations, patient history, and epidemiological information. The expected result is Negative.  Fact Sheet for Patients:  EntrepreneurPulse.com.au  Fact Sheet for Healthcare Providers:  IncredibleEmployment.be  This test is no t yet approved or cleared by the [place] FDA and  has been authorized for detection and/or diagnosis of SARS-CoV-2 by FDA under an Emergency Use Authorization (EUA). This EUA will remain  in effect (meaning this test can be used) for the duration of the COVID-19 declaration under Section 564(b)(1) of the Act, 21 U.S.C.section 360bbb-3(b)(1), unless the authorization is terminated  or revoked sooner.       Influenza A by PCR NEGATIVE NEGATIVE  Final   Influenza B by PCR NEGATIVE NEGATIVE Final    Comment: (NOTE) The Xpert Xpress SARS-CoV-2/FLU/RSV plus assay is intended as an aid in the diagnosis of influenza from Nasopharyngeal swab specimens and should not be used as a sole basis for treatment. Nasal washings and aspirates are unacceptable for Xpert Xpress SARS-CoV-2/FLU/RSV testing.  Fact Sheet for Patients: EntrepreneurPulse.com.au  Fact Sheet for Healthcare Providers: IncredibleEmployment.be  This test is not yet approved or cleared by the [place] FDA and has been authorized for detection and/or diagnosis of SARS-CoV-2 by FDA under an Emergency Use Authorization (EUA). This EUA will remain in effect (meaning this test can be used) for the duration of the COVID-19 declaration under Section 564(b)(1) of the Act, 21 U.S.C. section 360bbb-3(b)(1), unless the authorization is terminated or revoked.  Performed at [facility], [address] [place], [place] [address]     Labs: CBC: Recent Labs  Lab 12/12/21 1623 12/12/21 2009 12/14/21 0322 12/16/21 0338  WBC 10.2 9.5 6.4 6.0  NEUTROABS 8.2*  --   --   --   HGB 11.5* 11.5* 10.1* 9.8*  HCT 35.3* 34.6* 30.5* 29.5*  MCV 95.4 95.1 94.4 93.7  PLT 157 144* 108* 443*   Basic Metabolic Panel: Recent Labs  Lab 12/12/21 1623 12/12/21 2009 12/13/21 0407 12/14/21 0322  NA 141  --  137 137  K 4.1  --  4.0 4.0  CL 107  --  105 105  CO2 25  --  24 25  GLUCOSE 134*  --  143* 127*  BUN 25*  --  25* 21  CREATININE 1.71* 1.52* 1.38* 1.19  CALCIUM 8.6*  --  8.4* 8.4*   Liver Function Tests: Recent Labs  Lab 12/12/21 1623 12/13/21 0407  AST 26 23  ALT 21 19  ALKPHOS 43 39  BILITOT 0.6 0.8  PROT 7.3 6.6  ALBUMIN 3.6 3.1*   CBG: No results for input(s): GLUCAP in the last 168 hours.  Discharge time spent: greater than 30 minutes.  Signed: Ezekiel Slocumb, DO [facility] 12/16/2021

## 2021-12-16 NOTE — Progress Notes (Signed)
Report called to [facility] to [place] at 1150. IV removed. Wife at bedside for transport with EMS.

## 2021-12-16 NOTE — TOC Transition Note (Signed)
Transition of Care [facility]) - CM/SW Discharge Note   Patient Details  Name: Dametrius Sanjuan MRN: [id number] Date of Birth: August 09, 1931  [facility] CM/SW Contact:  Eileen Stanford, LCSW Phone Number: 12/16/2021, 11:26 AM   Clinical Narrative:   Clinical Social Worker facilitated patient discharge including contacting patient family and facility to confirm patient discharge plans.  Clinical information faxed to facility and family agreeable with plan.  CSW arranged ambulance transport via [facility] to [facility] .  RN to call [phone] for report prior to discharge.      Final next level of care: Skilled Nursing Facility Barriers to Discharge: No Barriers Identified   Patient Goals and CMS Choice Patient states their goals for this hospitalization and ongoing recovery are:: [facility] with [facility]      Discharge Placement              Patient chooses bed at:  [facility]) Patient to be transferred to facility by: [facility] Name of family member notified: spouse at bedside Patient and family notified of of transfer: 12/16/21  Discharge Plan and Services   Discharge Planning Services: CM Consult            DME Arranged: N/A         HH Arranged: PT HH Agency: [place] Date [place]: 12/14/21 Time [place]: 1049 Representative spoke with at [place]: [place]  Social Determinants of Health ([place]) Interventions     Readmission Risk Interventions     View : No data to display.

## 2021-12-16 NOTE — Plan of Care (Signed)

## 2021-12-16 NOTE — Assessment & Plan Note (Signed)
Possibly due to Lovenox. --Repeat CBC in 1-2 weeks

## 2021-12-16 NOTE — Consult Note (Signed)
Patient received COVID-19 mRNA bivalent vaccine [employer]) IM vaccine on 5/16 @ 1609.   Eleonore Chiquito, PharmD, BCPS

## 2021-12-20 DIAGNOSIS — J849 Interstitial pulmonary disease, unspecified: Secondary | ICD-10-CM | POA: Diagnosis not present

## 2021-12-20 DIAGNOSIS — N400 Enlarged prostate without lower urinary tract symptoms: Secondary | ICD-10-CM | POA: Diagnosis not present

## 2021-12-20 DIAGNOSIS — G301 Alzheimer's disease with late onset: Secondary | ICD-10-CM | POA: Diagnosis not present

## 2021-12-20 DIAGNOSIS — J9611 Chronic respiratory failure with hypoxia: Secondary | ICD-10-CM | POA: Diagnosis not present

## 2021-12-20 DIAGNOSIS — K219 Gastro-esophageal reflux disease without esophagitis: Secondary | ICD-10-CM | POA: Diagnosis not present

## 2021-12-20 DIAGNOSIS — E1159 Type 2 diabetes mellitus with other circulatory complications: Secondary | ICD-10-CM | POA: Diagnosis not present

## 2021-12-20 DIAGNOSIS — S329XXA Fracture of unspecified parts of lumbosacral spine and pelvis, initial encounter for closed fracture: Secondary | ICD-10-CM | POA: Diagnosis not present

## 2022-01-03 DIAGNOSIS — S32591A Other specified fracture of right pubis, initial encounter for closed fracture: Secondary | ICD-10-CM | POA: Diagnosis not present

## 2022-01-11 DIAGNOSIS — F0150 Vascular dementia without behavioral disturbance: Secondary | ICD-10-CM | POA: Diagnosis not present

## 2022-01-11 DIAGNOSIS — Z7984 Long term (current) use of oral hypoglycemic drugs: Secondary | ICD-10-CM | POA: Diagnosis not present

## 2022-01-11 DIAGNOSIS — Z87442 Personal history of urinary calculi: Secondary | ICD-10-CM | POA: Diagnosis not present

## 2022-01-11 DIAGNOSIS — J9601 Acute respiratory failure with hypoxia: Secondary | ICD-10-CM | POA: Diagnosis not present

## 2022-01-11 DIAGNOSIS — I69398 Other sequelae of cerebral infarction: Secondary | ICD-10-CM | POA: Diagnosis not present

## 2022-01-11 DIAGNOSIS — Z7982 Long term (current) use of aspirin: Secondary | ICD-10-CM | POA: Diagnosis not present

## 2022-01-11 DIAGNOSIS — E119 Type 2 diabetes mellitus without complications: Secondary | ICD-10-CM | POA: Diagnosis not present

## 2022-01-11 DIAGNOSIS — I10 Essential (primary) hypertension: Secondary | ICD-10-CM | POA: Diagnosis not present

## 2022-01-11 DIAGNOSIS — Z8673 Personal history of transient ischemic attack (TIA), and cerebral infarction without residual deficits: Secondary | ICD-10-CM | POA: Diagnosis not present

## 2022-01-11 DIAGNOSIS — R269 Unspecified abnormalities of gait and mobility: Secondary | ICD-10-CM | POA: Diagnosis not present

## 2022-01-11 DIAGNOSIS — K219 Gastro-esophageal reflux disease without esophagitis: Secondary | ICD-10-CM | POA: Diagnosis not present

## 2022-01-11 DIAGNOSIS — G4733 Obstructive sleep apnea (adult) (pediatric): Secondary | ICD-10-CM | POA: Diagnosis not present

## 2022-01-11 DIAGNOSIS — K449 Diaphragmatic hernia without obstruction or gangrene: Secondary | ICD-10-CM | POA: Diagnosis not present

## 2022-01-11 DIAGNOSIS — M1990 Unspecified osteoarthritis, unspecified site: Secondary | ICD-10-CM | POA: Diagnosis not present

## 2022-01-11 DIAGNOSIS — S3282XD Multiple fractures of pelvis without disruption of pelvic ring, subsequent encounter for fracture with routine healing: Secondary | ICD-10-CM | POA: Diagnosis not present

## 2022-01-11 DIAGNOSIS — K5730 Diverticulosis of large intestine without perforation or abscess without bleeding: Secondary | ICD-10-CM | POA: Diagnosis not present

## 2022-01-11 DIAGNOSIS — J849 Interstitial pulmonary disease, unspecified: Secondary | ICD-10-CM | POA: Diagnosis not present

## 2022-01-12 DIAGNOSIS — M109 Gout, unspecified: Secondary | ICD-10-CM | POA: Diagnosis not present

## 2022-01-12 DIAGNOSIS — E1151 Type 2 diabetes mellitus with diabetic peripheral angiopathy without gangrene: Secondary | ICD-10-CM | POA: Diagnosis not present

## 2022-01-12 DIAGNOSIS — D500 Iron deficiency anemia secondary to blood loss (chronic): Secondary | ICD-10-CM | POA: Diagnosis not present

## 2022-01-12 DIAGNOSIS — D696 Thrombocytopenia, unspecified: Secondary | ICD-10-CM | POA: Diagnosis not present

## 2022-01-12 DIAGNOSIS — S32301A Unspecified fracture of right ilium, initial encounter for closed fracture: Secondary | ICD-10-CM | POA: Diagnosis not present

## 2022-01-13 DIAGNOSIS — Z8781 Personal history of (healed) traumatic fracture: Secondary | ICD-10-CM | POA: Diagnosis not present

## 2022-01-13 DIAGNOSIS — Z09 Encounter for follow-up examination after completed treatment for conditions other than malignant neoplasm: Secondary | ICD-10-CM | POA: Diagnosis not present

## 2022-01-14 DIAGNOSIS — R300 Dysuria: Secondary | ICD-10-CM | POA: Diagnosis not present

## 2022-01-14 DIAGNOSIS — R5383 Other fatigue: Secondary | ICD-10-CM | POA: Diagnosis not present

## 2022-01-17 DIAGNOSIS — R269 Unspecified abnormalities of gait and mobility: Secondary | ICD-10-CM | POA: Diagnosis not present

## 2022-01-17 DIAGNOSIS — M1990 Unspecified osteoarthritis, unspecified site: Secondary | ICD-10-CM | POA: Diagnosis not present

## 2022-01-17 DIAGNOSIS — K219 Gastro-esophageal reflux disease without esophagitis: Secondary | ICD-10-CM | POA: Diagnosis not present

## 2022-01-17 DIAGNOSIS — J849 Interstitial pulmonary disease, unspecified: Secondary | ICD-10-CM | POA: Diagnosis not present

## 2022-01-17 DIAGNOSIS — K5730 Diverticulosis of large intestine without perforation or abscess without bleeding: Secondary | ICD-10-CM | POA: Diagnosis not present

## 2022-01-17 DIAGNOSIS — G4733 Obstructive sleep apnea (adult) (pediatric): Secondary | ICD-10-CM | POA: Diagnosis not present

## 2022-01-17 DIAGNOSIS — E119 Type 2 diabetes mellitus without complications: Secondary | ICD-10-CM | POA: Diagnosis not present

## 2022-01-17 DIAGNOSIS — Z8673 Personal history of transient ischemic attack (TIA), and cerebral infarction without residual deficits: Secondary | ICD-10-CM | POA: Diagnosis not present

## 2022-01-17 DIAGNOSIS — Z7984 Long term (current) use of oral hypoglycemic drugs: Secondary | ICD-10-CM | POA: Diagnosis not present

## 2022-01-17 DIAGNOSIS — I10 Essential (primary) hypertension: Secondary | ICD-10-CM | POA: Diagnosis not present

## 2022-01-17 DIAGNOSIS — J9601 Acute respiratory failure with hypoxia: Secondary | ICD-10-CM | POA: Diagnosis not present

## 2022-01-17 DIAGNOSIS — K449 Diaphragmatic hernia without obstruction or gangrene: Secondary | ICD-10-CM | POA: Diagnosis not present

## 2022-01-17 DIAGNOSIS — I69398 Other sequelae of cerebral infarction: Secondary | ICD-10-CM | POA: Diagnosis not present

## 2022-01-17 DIAGNOSIS — Z7982 Long term (current) use of aspirin: Secondary | ICD-10-CM | POA: Diagnosis not present

## 2022-01-17 DIAGNOSIS — F0150 Vascular dementia without behavioral disturbance: Secondary | ICD-10-CM | POA: Diagnosis not present

## 2022-01-17 DIAGNOSIS — Z87442 Personal history of urinary calculi: Secondary | ICD-10-CM | POA: Diagnosis not present

## 2022-01-17 DIAGNOSIS — S3282XD Multiple fractures of pelvis without disruption of pelvic ring, subsequent encounter for fracture with routine healing: Secondary | ICD-10-CM | POA: Diagnosis not present

## 2022-01-18 DIAGNOSIS — Z8669 Personal history of other diseases of the nervous system and sense organs: Secondary | ICD-10-CM | POA: Diagnosis not present

## 2022-01-18 DIAGNOSIS — R8291 Other chromoabnormalities of urine: Secondary | ICD-10-CM | POA: Diagnosis not present

## 2022-01-20 DIAGNOSIS — M109 Gout, unspecified: Secondary | ICD-10-CM | POA: Diagnosis not present

## 2022-01-20 DIAGNOSIS — D696 Thrombocytopenia, unspecified: Secondary | ICD-10-CM | POA: Diagnosis not present

## 2022-01-20 DIAGNOSIS — M79675 Pain in left toe(s): Secondary | ICD-10-CM | POA: Diagnosis not present

## 2022-01-20 DIAGNOSIS — L03116 Cellulitis of left lower limb: Secondary | ICD-10-CM | POA: Diagnosis not present

## 2022-01-25 DIAGNOSIS — I69398 Other sequelae of cerebral infarction: Secondary | ICD-10-CM | POA: Diagnosis not present

## 2022-01-25 DIAGNOSIS — J9601 Acute respiratory failure with hypoxia: Secondary | ICD-10-CM | POA: Diagnosis not present

## 2022-01-25 DIAGNOSIS — Z7984 Long term (current) use of oral hypoglycemic drugs: Secondary | ICD-10-CM | POA: Diagnosis not present

## 2022-01-25 DIAGNOSIS — K5730 Diverticulosis of large intestine without perforation or abscess without bleeding: Secondary | ICD-10-CM | POA: Diagnosis not present

## 2022-01-25 DIAGNOSIS — R269 Unspecified abnormalities of gait and mobility: Secondary | ICD-10-CM | POA: Diagnosis not present

## 2022-01-25 DIAGNOSIS — Z8673 Personal history of transient ischemic attack (TIA), and cerebral infarction without residual deficits: Secondary | ICD-10-CM | POA: Diagnosis not present

## 2022-01-25 DIAGNOSIS — E119 Type 2 diabetes mellitus without complications: Secondary | ICD-10-CM | POA: Diagnosis not present

## 2022-01-25 DIAGNOSIS — M1990 Unspecified osteoarthritis, unspecified site: Secondary | ICD-10-CM | POA: Diagnosis not present

## 2022-01-25 DIAGNOSIS — K219 Gastro-esophageal reflux disease without esophagitis: Secondary | ICD-10-CM | POA: Diagnosis not present

## 2022-01-25 DIAGNOSIS — Z87442 Personal history of urinary calculi: Secondary | ICD-10-CM | POA: Diagnosis not present

## 2022-01-25 DIAGNOSIS — F0150 Vascular dementia without behavioral disturbance: Secondary | ICD-10-CM | POA: Diagnosis not present

## 2022-01-25 DIAGNOSIS — G4733 Obstructive sleep apnea (adult) (pediatric): Secondary | ICD-10-CM | POA: Diagnosis not present

## 2022-01-25 DIAGNOSIS — Z7982 Long term (current) use of aspirin: Secondary | ICD-10-CM | POA: Diagnosis not present

## 2022-01-25 DIAGNOSIS — K449 Diaphragmatic hernia without obstruction or gangrene: Secondary | ICD-10-CM | POA: Diagnosis not present

## 2022-01-25 DIAGNOSIS — I10 Essential (primary) hypertension: Secondary | ICD-10-CM | POA: Diagnosis not present

## 2022-01-25 DIAGNOSIS — S3282XD Multiple fractures of pelvis without disruption of pelvic ring, subsequent encounter for fracture with routine healing: Secondary | ICD-10-CM | POA: Diagnosis not present

## 2022-01-25 DIAGNOSIS — J849 Interstitial pulmonary disease, unspecified: Secondary | ICD-10-CM | POA: Diagnosis not present

## 2022-02-02 DIAGNOSIS — K449 Diaphragmatic hernia without obstruction or gangrene: Secondary | ICD-10-CM | POA: Diagnosis not present

## 2022-02-02 DIAGNOSIS — K219 Gastro-esophageal reflux disease without esophagitis: Secondary | ICD-10-CM | POA: Diagnosis not present

## 2022-02-02 DIAGNOSIS — I10 Essential (primary) hypertension: Secondary | ICD-10-CM | POA: Diagnosis not present

## 2022-02-02 DIAGNOSIS — K5730 Diverticulosis of large intestine without perforation or abscess without bleeding: Secondary | ICD-10-CM | POA: Diagnosis not present

## 2022-02-02 DIAGNOSIS — G4733 Obstructive sleep apnea (adult) (pediatric): Secondary | ICD-10-CM | POA: Diagnosis not present

## 2022-02-02 DIAGNOSIS — R269 Unspecified abnormalities of gait and mobility: Secondary | ICD-10-CM | POA: Diagnosis not present

## 2022-02-02 DIAGNOSIS — J849 Interstitial pulmonary disease, unspecified: Secondary | ICD-10-CM | POA: Diagnosis not present

## 2022-02-02 DIAGNOSIS — Z7982 Long term (current) use of aspirin: Secondary | ICD-10-CM | POA: Diagnosis not present

## 2022-02-02 DIAGNOSIS — J9601 Acute respiratory failure with hypoxia: Secondary | ICD-10-CM | POA: Diagnosis not present

## 2022-02-02 DIAGNOSIS — M1990 Unspecified osteoarthritis, unspecified site: Secondary | ICD-10-CM | POA: Diagnosis not present

## 2022-02-02 DIAGNOSIS — Z8673 Personal history of transient ischemic attack (TIA), and cerebral infarction without residual deficits: Secondary | ICD-10-CM | POA: Diagnosis not present

## 2022-02-02 DIAGNOSIS — S3282XD Multiple fractures of pelvis without disruption of pelvic ring, subsequent encounter for fracture with routine healing: Secondary | ICD-10-CM | POA: Diagnosis not present

## 2022-02-02 DIAGNOSIS — Z7984 Long term (current) use of oral hypoglycemic drugs: Secondary | ICD-10-CM | POA: Diagnosis not present

## 2022-02-02 DIAGNOSIS — Z87442 Personal history of urinary calculi: Secondary | ICD-10-CM | POA: Diagnosis not present

## 2022-02-02 DIAGNOSIS — I69398 Other sequelae of cerebral infarction: Secondary | ICD-10-CM | POA: Diagnosis not present

## 2022-02-02 DIAGNOSIS — F0150 Vascular dementia without behavioral disturbance: Secondary | ICD-10-CM | POA: Diagnosis not present

## 2022-02-02 DIAGNOSIS — E119 Type 2 diabetes mellitus without complications: Secondary | ICD-10-CM | POA: Diagnosis not present

## 2022-02-03 DIAGNOSIS — I10 Essential (primary) hypertension: Secondary | ICD-10-CM | POA: Diagnosis not present

## 2022-02-03 DIAGNOSIS — G4733 Obstructive sleep apnea (adult) (pediatric): Secondary | ICD-10-CM | POA: Diagnosis not present

## 2022-02-03 DIAGNOSIS — E119 Type 2 diabetes mellitus without complications: Secondary | ICD-10-CM | POA: Diagnosis not present

## 2022-02-03 DIAGNOSIS — S3282XD Multiple fractures of pelvis without disruption of pelvic ring, subsequent encounter for fracture with routine healing: Secondary | ICD-10-CM | POA: Diagnosis not present

## 2022-02-03 DIAGNOSIS — J9601 Acute respiratory failure with hypoxia: Secondary | ICD-10-CM | POA: Diagnosis not present

## 2022-02-03 DIAGNOSIS — I69398 Other sequelae of cerebral infarction: Secondary | ICD-10-CM | POA: Diagnosis not present

## 2022-02-03 DIAGNOSIS — K219 Gastro-esophageal reflux disease without esophagitis: Secondary | ICD-10-CM | POA: Diagnosis not present

## 2022-02-14 DIAGNOSIS — Z7984 Long term (current) use of oral hypoglycemic drugs: Secondary | ICD-10-CM | POA: Diagnosis not present

## 2022-02-14 DIAGNOSIS — K449 Diaphragmatic hernia without obstruction or gangrene: Secondary | ICD-10-CM | POA: Diagnosis not present

## 2022-02-14 DIAGNOSIS — S3282XD Multiple fractures of pelvis without disruption of pelvic ring, subsequent encounter for fracture with routine healing: Secondary | ICD-10-CM | POA: Diagnosis not present

## 2022-02-14 DIAGNOSIS — J849 Interstitial pulmonary disease, unspecified: Secondary | ICD-10-CM | POA: Diagnosis not present

## 2022-02-14 DIAGNOSIS — Z7982 Long term (current) use of aspirin: Secondary | ICD-10-CM | POA: Diagnosis not present

## 2022-02-14 DIAGNOSIS — M1990 Unspecified osteoarthritis, unspecified site: Secondary | ICD-10-CM | POA: Diagnosis not present

## 2022-02-14 DIAGNOSIS — F0150 Vascular dementia without behavioral disturbance: Secondary | ICD-10-CM | POA: Diagnosis not present

## 2022-02-14 DIAGNOSIS — I69398 Other sequelae of cerebral infarction: Secondary | ICD-10-CM | POA: Diagnosis not present

## 2022-02-14 DIAGNOSIS — Z8673 Personal history of transient ischemic attack (TIA), and cerebral infarction without residual deficits: Secondary | ICD-10-CM | POA: Diagnosis not present

## 2022-02-14 DIAGNOSIS — E119 Type 2 diabetes mellitus without complications: Secondary | ICD-10-CM | POA: Diagnosis not present

## 2022-02-14 DIAGNOSIS — R269 Unspecified abnormalities of gait and mobility: Secondary | ICD-10-CM | POA: Diagnosis not present

## 2022-02-14 DIAGNOSIS — J9601 Acute respiratory failure with hypoxia: Secondary | ICD-10-CM | POA: Diagnosis not present

## 2022-02-14 DIAGNOSIS — I10 Essential (primary) hypertension: Secondary | ICD-10-CM | POA: Diagnosis not present

## 2022-02-14 DIAGNOSIS — K219 Gastro-esophageal reflux disease without esophagitis: Secondary | ICD-10-CM | POA: Diagnosis not present

## 2022-02-14 DIAGNOSIS — Z87442 Personal history of urinary calculi: Secondary | ICD-10-CM | POA: Diagnosis not present

## 2022-02-14 DIAGNOSIS — G4733 Obstructive sleep apnea (adult) (pediatric): Secondary | ICD-10-CM | POA: Diagnosis not present

## 2022-02-14 DIAGNOSIS — K5730 Diverticulosis of large intestine without perforation or abscess without bleeding: Secondary | ICD-10-CM | POA: Diagnosis not present

## 2022-02-17 DIAGNOSIS — Z7982 Long term (current) use of aspirin: Secondary | ICD-10-CM | POA: Diagnosis not present

## 2022-02-17 DIAGNOSIS — I69398 Other sequelae of cerebral infarction: Secondary | ICD-10-CM | POA: Diagnosis not present

## 2022-02-17 DIAGNOSIS — Z87442 Personal history of urinary calculi: Secondary | ICD-10-CM | POA: Diagnosis not present

## 2022-02-17 DIAGNOSIS — J9601 Acute respiratory failure with hypoxia: Secondary | ICD-10-CM | POA: Diagnosis not present

## 2022-02-17 DIAGNOSIS — J849 Interstitial pulmonary disease, unspecified: Secondary | ICD-10-CM | POA: Diagnosis not present

## 2022-02-17 DIAGNOSIS — M1990 Unspecified osteoarthritis, unspecified site: Secondary | ICD-10-CM | POA: Diagnosis not present

## 2022-02-17 DIAGNOSIS — K449 Diaphragmatic hernia without obstruction or gangrene: Secondary | ICD-10-CM | POA: Diagnosis not present

## 2022-02-17 DIAGNOSIS — Z7984 Long term (current) use of oral hypoglycemic drugs: Secondary | ICD-10-CM | POA: Diagnosis not present

## 2022-02-17 DIAGNOSIS — R269 Unspecified abnormalities of gait and mobility: Secondary | ICD-10-CM | POA: Diagnosis not present

## 2022-02-17 DIAGNOSIS — F0150 Vascular dementia without behavioral disturbance: Secondary | ICD-10-CM | POA: Diagnosis not present

## 2022-02-17 DIAGNOSIS — K219 Gastro-esophageal reflux disease without esophagitis: Secondary | ICD-10-CM | POA: Diagnosis not present

## 2022-02-17 DIAGNOSIS — G4733 Obstructive sleep apnea (adult) (pediatric): Secondary | ICD-10-CM | POA: Diagnosis not present

## 2022-02-17 DIAGNOSIS — Z8673 Personal history of transient ischemic attack (TIA), and cerebral infarction without residual deficits: Secondary | ICD-10-CM | POA: Diagnosis not present

## 2022-02-17 DIAGNOSIS — I10 Essential (primary) hypertension: Secondary | ICD-10-CM | POA: Diagnosis not present

## 2022-02-17 DIAGNOSIS — E119 Type 2 diabetes mellitus without complications: Secondary | ICD-10-CM | POA: Diagnosis not present

## 2022-02-17 DIAGNOSIS — S3282XD Multiple fractures of pelvis without disruption of pelvic ring, subsequent encounter for fracture with routine healing: Secondary | ICD-10-CM | POA: Diagnosis not present

## 2022-02-17 DIAGNOSIS — K5730 Diverticulosis of large intestine without perforation or abscess without bleeding: Secondary | ICD-10-CM | POA: Diagnosis not present

## 2022-02-18 DIAGNOSIS — I69354 Hemiplegia and hemiparesis following cerebral infarction affecting left non-dominant side: Secondary | ICD-10-CM | POA: Diagnosis not present

## 2022-02-18 DIAGNOSIS — Z9183 Wandering in diseases classified elsewhere: Secondary | ICD-10-CM | POA: Diagnosis not present

## 2022-02-18 DIAGNOSIS — E1169 Type 2 diabetes mellitus with other specified complication: Secondary | ICD-10-CM | POA: Diagnosis not present

## 2022-02-18 DIAGNOSIS — F03918 Unspecified dementia, unspecified severity, with other behavioral disturbance: Secondary | ICD-10-CM | POA: Diagnosis not present

## 2022-02-18 DIAGNOSIS — E785 Hyperlipidemia, unspecified: Secondary | ICD-10-CM | POA: Diagnosis not present

## 2022-02-21 DIAGNOSIS — I69398 Other sequelae of cerebral infarction: Secondary | ICD-10-CM | POA: Diagnosis not present

## 2022-02-21 DIAGNOSIS — K219 Gastro-esophageal reflux disease without esophagitis: Secondary | ICD-10-CM | POA: Diagnosis not present

## 2022-02-21 DIAGNOSIS — M1990 Unspecified osteoarthritis, unspecified site: Secondary | ICD-10-CM | POA: Diagnosis not present

## 2022-02-21 DIAGNOSIS — I10 Essential (primary) hypertension: Secondary | ICD-10-CM | POA: Diagnosis not present

## 2022-02-21 DIAGNOSIS — Z8673 Personal history of transient ischemic attack (TIA), and cerebral infarction without residual deficits: Secondary | ICD-10-CM | POA: Diagnosis not present

## 2022-02-21 DIAGNOSIS — S32591D Other specified fracture of right pubis, subsequent encounter for fracture with routine healing: Secondary | ICD-10-CM | POA: Diagnosis not present

## 2022-02-21 DIAGNOSIS — J849 Interstitial pulmonary disease, unspecified: Secondary | ICD-10-CM | POA: Diagnosis not present

## 2022-02-21 DIAGNOSIS — R269 Unspecified abnormalities of gait and mobility: Secondary | ICD-10-CM | POA: Diagnosis not present

## 2022-02-21 DIAGNOSIS — K5730 Diverticulosis of large intestine without perforation or abscess without bleeding: Secondary | ICD-10-CM | POA: Diagnosis not present

## 2022-02-21 DIAGNOSIS — F0150 Vascular dementia without behavioral disturbance: Secondary | ICD-10-CM | POA: Diagnosis not present

## 2022-02-21 DIAGNOSIS — Z87442 Personal history of urinary calculi: Secondary | ICD-10-CM | POA: Diagnosis not present

## 2022-02-21 DIAGNOSIS — Z7982 Long term (current) use of aspirin: Secondary | ICD-10-CM | POA: Diagnosis not present

## 2022-02-21 DIAGNOSIS — K449 Diaphragmatic hernia without obstruction or gangrene: Secondary | ICD-10-CM | POA: Diagnosis not present

## 2022-02-21 DIAGNOSIS — Z7984 Long term (current) use of oral hypoglycemic drugs: Secondary | ICD-10-CM | POA: Diagnosis not present

## 2022-02-21 DIAGNOSIS — S3282XD Multiple fractures of pelvis without disruption of pelvic ring, subsequent encounter for fracture with routine healing: Secondary | ICD-10-CM | POA: Diagnosis not present

## 2022-02-21 DIAGNOSIS — G4733 Obstructive sleep apnea (adult) (pediatric): Secondary | ICD-10-CM | POA: Diagnosis not present

## 2022-02-21 DIAGNOSIS — J9601 Acute respiratory failure with hypoxia: Secondary | ICD-10-CM | POA: Diagnosis not present

## 2022-02-21 DIAGNOSIS — E119 Type 2 diabetes mellitus without complications: Secondary | ICD-10-CM | POA: Diagnosis not present

## 2022-02-24 DIAGNOSIS — E119 Type 2 diabetes mellitus without complications: Secondary | ICD-10-CM | POA: Diagnosis not present

## 2022-02-24 DIAGNOSIS — K219 Gastro-esophageal reflux disease without esophagitis: Secondary | ICD-10-CM | POA: Diagnosis not present

## 2022-02-24 DIAGNOSIS — M1990 Unspecified osteoarthritis, unspecified site: Secondary | ICD-10-CM | POA: Diagnosis not present

## 2022-02-24 DIAGNOSIS — S3282XD Multiple fractures of pelvis without disruption of pelvic ring, subsequent encounter for fracture with routine healing: Secondary | ICD-10-CM | POA: Diagnosis not present

## 2022-02-24 DIAGNOSIS — F0150 Vascular dementia without behavioral disturbance: Secondary | ICD-10-CM | POA: Diagnosis not present

## 2022-02-24 DIAGNOSIS — R269 Unspecified abnormalities of gait and mobility: Secondary | ICD-10-CM | POA: Diagnosis not present

## 2022-02-24 DIAGNOSIS — Z7984 Long term (current) use of oral hypoglycemic drugs: Secondary | ICD-10-CM | POA: Diagnosis not present

## 2022-02-24 DIAGNOSIS — G4733 Obstructive sleep apnea (adult) (pediatric): Secondary | ICD-10-CM | POA: Diagnosis not present

## 2022-02-24 DIAGNOSIS — Z7982 Long term (current) use of aspirin: Secondary | ICD-10-CM | POA: Diagnosis not present

## 2022-02-24 DIAGNOSIS — Z8673 Personal history of transient ischemic attack (TIA), and cerebral infarction without residual deficits: Secondary | ICD-10-CM | POA: Diagnosis not present

## 2022-02-24 DIAGNOSIS — J9601 Acute respiratory failure with hypoxia: Secondary | ICD-10-CM | POA: Diagnosis not present

## 2022-02-24 DIAGNOSIS — I10 Essential (primary) hypertension: Secondary | ICD-10-CM | POA: Diagnosis not present

## 2022-02-24 DIAGNOSIS — I69398 Other sequelae of cerebral infarction: Secondary | ICD-10-CM | POA: Diagnosis not present

## 2022-02-24 DIAGNOSIS — K5730 Diverticulosis of large intestine without perforation or abscess without bleeding: Secondary | ICD-10-CM | POA: Diagnosis not present

## 2022-02-24 DIAGNOSIS — Z87442 Personal history of urinary calculi: Secondary | ICD-10-CM | POA: Diagnosis not present

## 2022-02-24 DIAGNOSIS — J849 Interstitial pulmonary disease, unspecified: Secondary | ICD-10-CM | POA: Diagnosis not present

## 2022-02-24 DIAGNOSIS — K449 Diaphragmatic hernia without obstruction or gangrene: Secondary | ICD-10-CM | POA: Diagnosis not present

## 2022-02-28 DIAGNOSIS — J849 Interstitial pulmonary disease, unspecified: Secondary | ICD-10-CM | POA: Diagnosis not present

## 2022-02-28 DIAGNOSIS — J9601 Acute respiratory failure with hypoxia: Secondary | ICD-10-CM | POA: Diagnosis not present

## 2022-02-28 DIAGNOSIS — Z87442 Personal history of urinary calculi: Secondary | ICD-10-CM | POA: Diagnosis not present

## 2022-02-28 DIAGNOSIS — K5730 Diverticulosis of large intestine without perforation or abscess without bleeding: Secondary | ICD-10-CM | POA: Diagnosis not present

## 2022-02-28 DIAGNOSIS — F0150 Vascular dementia without behavioral disturbance: Secondary | ICD-10-CM | POA: Diagnosis not present

## 2022-02-28 DIAGNOSIS — K219 Gastro-esophageal reflux disease without esophagitis: Secondary | ICD-10-CM | POA: Diagnosis not present

## 2022-02-28 DIAGNOSIS — Z7984 Long term (current) use of oral hypoglycemic drugs: Secondary | ICD-10-CM | POA: Diagnosis not present

## 2022-02-28 DIAGNOSIS — E119 Type 2 diabetes mellitus without complications: Secondary | ICD-10-CM | POA: Diagnosis not present

## 2022-02-28 DIAGNOSIS — I10 Essential (primary) hypertension: Secondary | ICD-10-CM | POA: Diagnosis not present

## 2022-02-28 DIAGNOSIS — K449 Diaphragmatic hernia without obstruction or gangrene: Secondary | ICD-10-CM | POA: Diagnosis not present

## 2022-02-28 DIAGNOSIS — G4733 Obstructive sleep apnea (adult) (pediatric): Secondary | ICD-10-CM | POA: Diagnosis not present

## 2022-02-28 DIAGNOSIS — S3282XD Multiple fractures of pelvis without disruption of pelvic ring, subsequent encounter for fracture with routine healing: Secondary | ICD-10-CM | POA: Diagnosis not present

## 2022-02-28 DIAGNOSIS — Z8673 Personal history of transient ischemic attack (TIA), and cerebral infarction without residual deficits: Secondary | ICD-10-CM | POA: Diagnosis not present

## 2022-02-28 DIAGNOSIS — I69398 Other sequelae of cerebral infarction: Secondary | ICD-10-CM | POA: Diagnosis not present

## 2022-02-28 DIAGNOSIS — R269 Unspecified abnormalities of gait and mobility: Secondary | ICD-10-CM | POA: Diagnosis not present

## 2022-02-28 DIAGNOSIS — M1990 Unspecified osteoarthritis, unspecified site: Secondary | ICD-10-CM | POA: Diagnosis not present

## 2022-02-28 DIAGNOSIS — Z7982 Long term (current) use of aspirin: Secondary | ICD-10-CM | POA: Diagnosis not present

## 2022-03-03 DIAGNOSIS — Z8673 Personal history of transient ischemic attack (TIA), and cerebral infarction without residual deficits: Secondary | ICD-10-CM | POA: Diagnosis not present

## 2022-03-03 DIAGNOSIS — F0150 Vascular dementia without behavioral disturbance: Secondary | ICD-10-CM | POA: Diagnosis not present

## 2022-03-03 DIAGNOSIS — J9601 Acute respiratory failure with hypoxia: Secondary | ICD-10-CM | POA: Diagnosis not present

## 2022-03-03 DIAGNOSIS — Z7984 Long term (current) use of oral hypoglycemic drugs: Secondary | ICD-10-CM | POA: Diagnosis not present

## 2022-03-03 DIAGNOSIS — S3282XD Multiple fractures of pelvis without disruption of pelvic ring, subsequent encounter for fracture with routine healing: Secondary | ICD-10-CM | POA: Diagnosis not present

## 2022-03-03 DIAGNOSIS — R269 Unspecified abnormalities of gait and mobility: Secondary | ICD-10-CM | POA: Diagnosis not present

## 2022-03-03 DIAGNOSIS — I69398 Other sequelae of cerebral infarction: Secondary | ICD-10-CM | POA: Diagnosis not present

## 2022-03-03 DIAGNOSIS — K449 Diaphragmatic hernia without obstruction or gangrene: Secondary | ICD-10-CM | POA: Diagnosis not present

## 2022-03-03 DIAGNOSIS — J849 Interstitial pulmonary disease, unspecified: Secondary | ICD-10-CM | POA: Diagnosis not present

## 2022-03-03 DIAGNOSIS — G4733 Obstructive sleep apnea (adult) (pediatric): Secondary | ICD-10-CM | POA: Diagnosis not present

## 2022-03-03 DIAGNOSIS — E119 Type 2 diabetes mellitus without complications: Secondary | ICD-10-CM | POA: Diagnosis not present

## 2022-03-03 DIAGNOSIS — M1990 Unspecified osteoarthritis, unspecified site: Secondary | ICD-10-CM | POA: Diagnosis not present

## 2022-03-03 DIAGNOSIS — K5730 Diverticulosis of large intestine without perforation or abscess without bleeding: Secondary | ICD-10-CM | POA: Diagnosis not present

## 2022-03-03 DIAGNOSIS — I10 Essential (primary) hypertension: Secondary | ICD-10-CM | POA: Diagnosis not present

## 2022-03-03 DIAGNOSIS — Z87442 Personal history of urinary calculi: Secondary | ICD-10-CM | POA: Diagnosis not present

## 2022-03-03 DIAGNOSIS — Z7982 Long term (current) use of aspirin: Secondary | ICD-10-CM | POA: Diagnosis not present

## 2022-03-03 DIAGNOSIS — K219 Gastro-esophageal reflux disease without esophagitis: Secondary | ICD-10-CM | POA: Diagnosis not present

## 2022-03-18 DIAGNOSIS — G4733 Obstructive sleep apnea (adult) (pediatric): Secondary | ICD-10-CM | POA: Diagnosis not present

## 2022-03-18 DIAGNOSIS — J9601 Acute respiratory failure with hypoxia: Secondary | ICD-10-CM | POA: Diagnosis not present

## 2022-03-18 DIAGNOSIS — M1990 Unspecified osteoarthritis, unspecified site: Secondary | ICD-10-CM | POA: Diagnosis not present

## 2022-03-18 DIAGNOSIS — Z87442 Personal history of urinary calculi: Secondary | ICD-10-CM | POA: Diagnosis not present

## 2022-03-18 DIAGNOSIS — K449 Diaphragmatic hernia without obstruction or gangrene: Secondary | ICD-10-CM | POA: Diagnosis not present

## 2022-03-18 DIAGNOSIS — Z7984 Long term (current) use of oral hypoglycemic drugs: Secondary | ICD-10-CM | POA: Diagnosis not present

## 2022-03-18 DIAGNOSIS — J849 Interstitial pulmonary disease, unspecified: Secondary | ICD-10-CM | POA: Diagnosis not present

## 2022-03-18 DIAGNOSIS — I10 Essential (primary) hypertension: Secondary | ICD-10-CM | POA: Diagnosis not present

## 2022-03-18 DIAGNOSIS — Z7982 Long term (current) use of aspirin: Secondary | ICD-10-CM | POA: Diagnosis not present

## 2022-03-18 DIAGNOSIS — K219 Gastro-esophageal reflux disease without esophagitis: Secondary | ICD-10-CM | POA: Diagnosis not present

## 2022-03-18 DIAGNOSIS — Z8673 Personal history of transient ischemic attack (TIA), and cerebral infarction without residual deficits: Secondary | ICD-10-CM | POA: Diagnosis not present

## 2022-03-18 DIAGNOSIS — R269 Unspecified abnormalities of gait and mobility: Secondary | ICD-10-CM | POA: Diagnosis not present

## 2022-03-18 DIAGNOSIS — E119 Type 2 diabetes mellitus without complications: Secondary | ICD-10-CM | POA: Diagnosis not present

## 2022-03-18 DIAGNOSIS — S3282XD Multiple fractures of pelvis without disruption of pelvic ring, subsequent encounter for fracture with routine healing: Secondary | ICD-10-CM | POA: Diagnosis not present

## 2022-03-18 DIAGNOSIS — F0150 Vascular dementia without behavioral disturbance: Secondary | ICD-10-CM | POA: Diagnosis not present

## 2022-03-18 DIAGNOSIS — K5730 Diverticulosis of large intestine without perforation or abscess without bleeding: Secondary | ICD-10-CM | POA: Diagnosis not present

## 2022-03-18 DIAGNOSIS — I69398 Other sequelae of cerebral infarction: Secondary | ICD-10-CM | POA: Diagnosis not present

## 2022-03-23 DIAGNOSIS — K449 Diaphragmatic hernia without obstruction or gangrene: Secondary | ICD-10-CM | POA: Diagnosis not present

## 2022-03-23 DIAGNOSIS — J9601 Acute respiratory failure with hypoxia: Secondary | ICD-10-CM | POA: Diagnosis not present

## 2022-03-23 DIAGNOSIS — Z87442 Personal history of urinary calculi: Secondary | ICD-10-CM | POA: Diagnosis not present

## 2022-03-23 DIAGNOSIS — Z7982 Long term (current) use of aspirin: Secondary | ICD-10-CM | POA: Diagnosis not present

## 2022-03-23 DIAGNOSIS — M1990 Unspecified osteoarthritis, unspecified site: Secondary | ICD-10-CM | POA: Diagnosis not present

## 2022-03-23 DIAGNOSIS — I10 Essential (primary) hypertension: Secondary | ICD-10-CM | POA: Diagnosis not present

## 2022-03-23 DIAGNOSIS — J849 Interstitial pulmonary disease, unspecified: Secondary | ICD-10-CM | POA: Diagnosis not present

## 2022-03-23 DIAGNOSIS — K219 Gastro-esophageal reflux disease without esophagitis: Secondary | ICD-10-CM | POA: Diagnosis not present

## 2022-03-23 DIAGNOSIS — Z8673 Personal history of transient ischemic attack (TIA), and cerebral infarction without residual deficits: Secondary | ICD-10-CM | POA: Diagnosis not present

## 2022-03-23 DIAGNOSIS — G4733 Obstructive sleep apnea (adult) (pediatric): Secondary | ICD-10-CM | POA: Diagnosis not present

## 2022-03-23 DIAGNOSIS — Z7984 Long term (current) use of oral hypoglycemic drugs: Secondary | ICD-10-CM | POA: Diagnosis not present

## 2022-03-23 DIAGNOSIS — K5730 Diverticulosis of large intestine without perforation or abscess without bleeding: Secondary | ICD-10-CM | POA: Diagnosis not present

## 2022-03-23 DIAGNOSIS — F0150 Vascular dementia without behavioral disturbance: Secondary | ICD-10-CM | POA: Diagnosis not present

## 2022-03-23 DIAGNOSIS — I69398 Other sequelae of cerebral infarction: Secondary | ICD-10-CM | POA: Diagnosis not present

## 2022-03-23 DIAGNOSIS — S3282XD Multiple fractures of pelvis without disruption of pelvic ring, subsequent encounter for fracture with routine healing: Secondary | ICD-10-CM | POA: Diagnosis not present

## 2022-03-23 DIAGNOSIS — E119 Type 2 diabetes mellitus without complications: Secondary | ICD-10-CM | POA: Diagnosis not present

## 2022-03-23 DIAGNOSIS — R269 Unspecified abnormalities of gait and mobility: Secondary | ICD-10-CM | POA: Diagnosis not present

## 2022-03-28 DIAGNOSIS — M1990 Unspecified osteoarthritis, unspecified site: Secondary | ICD-10-CM | POA: Diagnosis not present

## 2022-03-28 DIAGNOSIS — Z7984 Long term (current) use of oral hypoglycemic drugs: Secondary | ICD-10-CM | POA: Diagnosis not present

## 2022-03-28 DIAGNOSIS — K449 Diaphragmatic hernia without obstruction or gangrene: Secondary | ICD-10-CM | POA: Diagnosis not present

## 2022-03-28 DIAGNOSIS — J9601 Acute respiratory failure with hypoxia: Secondary | ICD-10-CM | POA: Diagnosis not present

## 2022-03-28 DIAGNOSIS — S3282XD Multiple fractures of pelvis without disruption of pelvic ring, subsequent encounter for fracture with routine healing: Secondary | ICD-10-CM | POA: Diagnosis not present

## 2022-03-28 DIAGNOSIS — Z7982 Long term (current) use of aspirin: Secondary | ICD-10-CM | POA: Diagnosis not present

## 2022-03-28 DIAGNOSIS — I10 Essential (primary) hypertension: Secondary | ICD-10-CM | POA: Diagnosis not present

## 2022-03-28 DIAGNOSIS — K5730 Diverticulosis of large intestine without perforation or abscess without bleeding: Secondary | ICD-10-CM | POA: Diagnosis not present

## 2022-03-28 DIAGNOSIS — I69398 Other sequelae of cerebral infarction: Secondary | ICD-10-CM | POA: Diagnosis not present

## 2022-03-28 DIAGNOSIS — Z87442 Personal history of urinary calculi: Secondary | ICD-10-CM | POA: Diagnosis not present

## 2022-03-28 DIAGNOSIS — F0150 Vascular dementia without behavioral disturbance: Secondary | ICD-10-CM | POA: Diagnosis not present

## 2022-03-28 DIAGNOSIS — E119 Type 2 diabetes mellitus without complications: Secondary | ICD-10-CM | POA: Diagnosis not present

## 2022-03-28 DIAGNOSIS — Z8673 Personal history of transient ischemic attack (TIA), and cerebral infarction without residual deficits: Secondary | ICD-10-CM | POA: Diagnosis not present

## 2022-03-28 DIAGNOSIS — R269 Unspecified abnormalities of gait and mobility: Secondary | ICD-10-CM | POA: Diagnosis not present

## 2022-03-28 DIAGNOSIS — J849 Interstitial pulmonary disease, unspecified: Secondary | ICD-10-CM | POA: Diagnosis not present

## 2022-03-28 DIAGNOSIS — K219 Gastro-esophageal reflux disease without esophagitis: Secondary | ICD-10-CM | POA: Diagnosis not present

## 2022-03-28 DIAGNOSIS — G4733 Obstructive sleep apnea (adult) (pediatric): Secondary | ICD-10-CM | POA: Diagnosis not present

## 2022-04-05 DIAGNOSIS — G4733 Obstructive sleep apnea (adult) (pediatric): Secondary | ICD-10-CM | POA: Diagnosis not present

## 2022-04-05 DIAGNOSIS — E119 Type 2 diabetes mellitus without complications: Secondary | ICD-10-CM | POA: Diagnosis not present

## 2022-04-05 DIAGNOSIS — F0150 Vascular dementia without behavioral disturbance: Secondary | ICD-10-CM | POA: Diagnosis not present

## 2022-04-05 DIAGNOSIS — K219 Gastro-esophageal reflux disease without esophagitis: Secondary | ICD-10-CM | POA: Diagnosis not present

## 2022-04-05 DIAGNOSIS — S3282XD Multiple fractures of pelvis without disruption of pelvic ring, subsequent encounter for fracture with routine healing: Secondary | ICD-10-CM | POA: Diagnosis not present

## 2022-04-05 DIAGNOSIS — Z7982 Long term (current) use of aspirin: Secondary | ICD-10-CM | POA: Diagnosis not present

## 2022-04-05 DIAGNOSIS — K5730 Diverticulosis of large intestine without perforation or abscess without bleeding: Secondary | ICD-10-CM | POA: Diagnosis not present

## 2022-04-05 DIAGNOSIS — Z7984 Long term (current) use of oral hypoglycemic drugs: Secondary | ICD-10-CM | POA: Diagnosis not present

## 2022-04-05 DIAGNOSIS — R269 Unspecified abnormalities of gait and mobility: Secondary | ICD-10-CM | POA: Diagnosis not present

## 2022-04-05 DIAGNOSIS — I69398 Other sequelae of cerebral infarction: Secondary | ICD-10-CM | POA: Diagnosis not present

## 2022-04-05 DIAGNOSIS — M1990 Unspecified osteoarthritis, unspecified site: Secondary | ICD-10-CM | POA: Diagnosis not present

## 2022-04-05 DIAGNOSIS — I10 Essential (primary) hypertension: Secondary | ICD-10-CM | POA: Diagnosis not present

## 2022-04-05 DIAGNOSIS — Z8673 Personal history of transient ischemic attack (TIA), and cerebral infarction without residual deficits: Secondary | ICD-10-CM | POA: Diagnosis not present

## 2022-04-05 DIAGNOSIS — J849 Interstitial pulmonary disease, unspecified: Secondary | ICD-10-CM | POA: Diagnosis not present

## 2022-04-05 DIAGNOSIS — K449 Diaphragmatic hernia without obstruction or gangrene: Secondary | ICD-10-CM | POA: Diagnosis not present

## 2022-04-05 DIAGNOSIS — Z87442 Personal history of urinary calculi: Secondary | ICD-10-CM | POA: Diagnosis not present

## 2022-04-05 DIAGNOSIS — J9601 Acute respiratory failure with hypoxia: Secondary | ICD-10-CM | POA: Diagnosis not present

## 2022-04-06 DIAGNOSIS — D696 Thrombocytopenia, unspecified: Secondary | ICD-10-CM | POA: Diagnosis not present

## 2022-04-06 DIAGNOSIS — E1151 Type 2 diabetes mellitus with diabetic peripheral angiopathy without gangrene: Secondary | ICD-10-CM | POA: Diagnosis not present

## 2022-04-06 DIAGNOSIS — N1831 Chronic kidney disease, stage 3a: Secondary | ICD-10-CM | POA: Diagnosis not present

## 2022-04-11 DIAGNOSIS — H02882 Meibomian gland dysfunction right lower eyelid: Secondary | ICD-10-CM | POA: Diagnosis not present

## 2022-04-11 DIAGNOSIS — H401221 Low-tension glaucoma, left eye, mild stage: Secondary | ICD-10-CM | POA: Diagnosis not present

## 2022-04-11 DIAGNOSIS — H02135 Senile ectropion of left lower eyelid: Secondary | ICD-10-CM | POA: Diagnosis not present

## 2022-04-11 DIAGNOSIS — H02132 Senile ectropion of right lower eyelid: Secondary | ICD-10-CM | POA: Diagnosis not present

## 2022-04-11 DIAGNOSIS — H02885 Meibomian gland dysfunction left lower eyelid: Secondary | ICD-10-CM | POA: Diagnosis not present

## 2022-04-11 DIAGNOSIS — Z961 Presence of intraocular lens: Secondary | ICD-10-CM | POA: Diagnosis not present

## 2022-04-11 DIAGNOSIS — I6930 Unspecified sequelae of cerebral infarction: Secondary | ICD-10-CM | POA: Diagnosis not present

## 2022-04-11 DIAGNOSIS — H401212 Low-tension glaucoma, right eye, moderate stage: Secondary | ICD-10-CM | POA: Diagnosis not present

## 2022-04-11 DIAGNOSIS — R7309 Other abnormal glucose: Secondary | ICD-10-CM | POA: Diagnosis not present

## 2022-04-12 DIAGNOSIS — I10 Essential (primary) hypertension: Secondary | ICD-10-CM | POA: Diagnosis not present

## 2022-04-12 DIAGNOSIS — I69398 Other sequelae of cerebral infarction: Secondary | ICD-10-CM | POA: Diagnosis not present

## 2022-04-12 DIAGNOSIS — J9601 Acute respiratory failure with hypoxia: Secondary | ICD-10-CM | POA: Diagnosis not present

## 2022-04-12 DIAGNOSIS — S3282XD Multiple fractures of pelvis without disruption of pelvic ring, subsequent encounter for fracture with routine healing: Secondary | ICD-10-CM | POA: Diagnosis not present

## 2022-04-12 DIAGNOSIS — K219 Gastro-esophageal reflux disease without esophagitis: Secondary | ICD-10-CM | POA: Diagnosis not present

## 2022-04-12 DIAGNOSIS — G4733 Obstructive sleep apnea (adult) (pediatric): Secondary | ICD-10-CM | POA: Diagnosis not present

## 2022-04-12 DIAGNOSIS — E119 Type 2 diabetes mellitus without complications: Secondary | ICD-10-CM | POA: Diagnosis not present

## 2022-04-14 DIAGNOSIS — H409 Unspecified glaucoma: Secondary | ICD-10-CM | POA: Diagnosis not present

## 2022-04-14 DIAGNOSIS — I69354 Hemiplegia and hemiparesis following cerebral infarction affecting left non-dominant side: Secondary | ICD-10-CM | POA: Diagnosis not present

## 2022-04-14 DIAGNOSIS — E1139 Type 2 diabetes mellitus with other diabetic ophthalmic complication: Secondary | ICD-10-CM | POA: Diagnosis not present

## 2022-05-13 DIAGNOSIS — L720 Epidermal cyst: Secondary | ICD-10-CM | POA: Diagnosis not present

## 2022-05-13 DIAGNOSIS — D2261 Melanocytic nevi of right upper limb, including shoulder: Secondary | ICD-10-CM | POA: Diagnosis not present

## 2022-05-13 DIAGNOSIS — D225 Melanocytic nevi of trunk: Secondary | ICD-10-CM | POA: Diagnosis not present

## 2022-05-13 DIAGNOSIS — D2272 Melanocytic nevi of left lower limb, including hip: Secondary | ICD-10-CM | POA: Diagnosis not present

## 2022-05-13 DIAGNOSIS — D2262 Melanocytic nevi of left upper limb, including shoulder: Secondary | ICD-10-CM | POA: Diagnosis not present

## 2022-05-13 DIAGNOSIS — L814 Other melanin hyperpigmentation: Secondary | ICD-10-CM | POA: Diagnosis not present

## 2022-05-13 DIAGNOSIS — Z85828 Personal history of other malignant neoplasm of skin: Secondary | ICD-10-CM | POA: Diagnosis not present

## 2022-06-01 LAB — MIN INHIBITORY CONC (2 DRUGS)

## 2022-06-29 DIAGNOSIS — J40 Bronchitis, not specified as acute or chronic: Secondary | ICD-10-CM | POA: Diagnosis not present

## 2022-06-29 DIAGNOSIS — J45902 Unspecified asthma with status asthmaticus: Secondary | ICD-10-CM | POA: Diagnosis not present

## 2022-06-29 DIAGNOSIS — R319 Hematuria, unspecified: Secondary | ICD-10-CM | POA: Diagnosis not present

## 2022-06-29 DIAGNOSIS — R918 Other nonspecific abnormal finding of lung field: Secondary | ICD-10-CM | POA: Diagnosis not present

## 2022-06-29 DIAGNOSIS — R0609 Other forms of dyspnea: Secondary | ICD-10-CM | POA: Diagnosis not present

## 2022-07-18 DIAGNOSIS — F01511 Vascular dementia, unspecified severity, with agitation: Secondary | ICD-10-CM | POA: Diagnosis not present

## 2022-07-21 DIAGNOSIS — K449 Diaphragmatic hernia without obstruction or gangrene: Secondary | ICD-10-CM | POA: Diagnosis not present

## 2022-07-21 DIAGNOSIS — J40 Bronchitis, not specified as acute or chronic: Secondary | ICD-10-CM | POA: Diagnosis not present

## 2022-07-21 DIAGNOSIS — R197 Diarrhea, unspecified: Secondary | ICD-10-CM | POA: Diagnosis not present

## 2022-07-21 DIAGNOSIS — R112 Nausea with vomiting, unspecified: Secondary | ICD-10-CM | POA: Diagnosis not present

## 2022-08-05 DIAGNOSIS — E119 Type 2 diabetes mellitus without complications: Secondary | ICD-10-CM | POA: Diagnosis not present

## 2022-08-05 DIAGNOSIS — I6523 Occlusion and stenosis of bilateral carotid arteries: Secondary | ICD-10-CM | POA: Diagnosis not present

## 2022-08-05 DIAGNOSIS — G4733 Obstructive sleep apnea (adult) (pediatric): Secondary | ICD-10-CM | POA: Diagnosis not present

## 2022-08-05 DIAGNOSIS — F0150 Vascular dementia without behavioral disturbance: Secondary | ICD-10-CM | POA: Diagnosis not present

## 2022-08-05 DIAGNOSIS — J9620 Acute and chronic respiratory failure, unspecified whether with hypoxia or hypercapnia: Secondary | ICD-10-CM | POA: Diagnosis not present

## 2022-08-05 DIAGNOSIS — J189 Pneumonia, unspecified organism: Secondary | ICD-10-CM | POA: Diagnosis not present

## 2022-08-05 DIAGNOSIS — J849 Interstitial pulmonary disease, unspecified: Secondary | ICD-10-CM | POA: Diagnosis not present

## 2022-09-19 DIAGNOSIS — R1310 Dysphagia, unspecified: Secondary | ICD-10-CM | POA: Diagnosis not present

## 2022-09-19 DIAGNOSIS — G309 Alzheimer's disease, unspecified: Secondary | ICD-10-CM | POA: Diagnosis not present

## 2022-09-19 DIAGNOSIS — E1151 Type 2 diabetes mellitus with diabetic peripheral angiopathy without gangrene: Secondary | ICD-10-CM | POA: Diagnosis not present

## 2022-09-19 DIAGNOSIS — F0150 Vascular dementia without behavioral disturbance: Secondary | ICD-10-CM | POA: Diagnosis not present

## 2022-09-19 DIAGNOSIS — R9082 White matter disease, unspecified: Secondary | ICD-10-CM | POA: Diagnosis not present

## 2022-09-19 DIAGNOSIS — F0280 Dementia in other diseases classified elsewhere without behavioral disturbance: Secondary | ICD-10-CM | POA: Diagnosis not present

## 2022-10-26 DIAGNOSIS — J9610 Chronic respiratory failure, unspecified whether with hypoxia or hypercapnia: Secondary | ICD-10-CM | POA: Diagnosis not present

## 2022-10-26 DIAGNOSIS — E119 Type 2 diabetes mellitus without complications: Secondary | ICD-10-CM | POA: Diagnosis not present

## 2022-10-26 DIAGNOSIS — F0154 Vascular dementia, unspecified severity, with anxiety: Secondary | ICD-10-CM | POA: Diagnosis not present

## 2022-10-26 DIAGNOSIS — G4733 Obstructive sleep apnea (adult) (pediatric): Secondary | ICD-10-CM | POA: Diagnosis not present

## 2022-10-26 DIAGNOSIS — F0153 Vascular dementia, unspecified severity, with mood disturbance: Secondary | ICD-10-CM | POA: Diagnosis not present

## 2022-10-26 DIAGNOSIS — J849 Interstitial pulmonary disease, unspecified: Secondary | ICD-10-CM | POA: Diagnosis not present

## 2022-10-26 DIAGNOSIS — I6523 Occlusion and stenosis of bilateral carotid arteries: Secondary | ICD-10-CM | POA: Diagnosis not present

## 2022-12-31 DEATH — deceased

## 2023-05-21 IMAGING — MR MR HEAD W/O CM
11 of 13 series · 33 of 48 positions shown · non-contrast
Comparison: No pertinent prior exam.

CLINICAL DATA: Stroke-like symptoms.  Slurred speech.

EXAM:
MRI HEAD WITHOUT CONTRAST
MRA HEAD WITHOUT CONTRAST
TECHNIQUE: Multiplanar, multi-echo pulse sequences of the brain and surrounding
structures were acquired without intravenous contrast. Angiographic
images of the Circle of Willis were acquired using MRA technique
without intravenous contrast.

[Series 5: ax dwi_tracew · axial · 3.0mm · 0.71mm/px · z∈[-128,+36]mm · 3 of 56 slices shown]
[im 1/56]
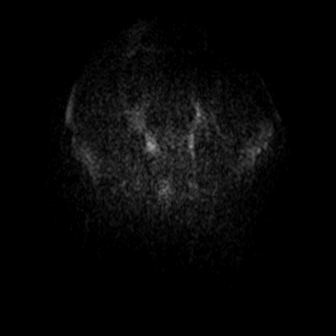
[im 28/56]
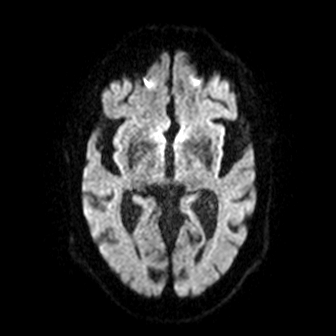
[im 56/56]
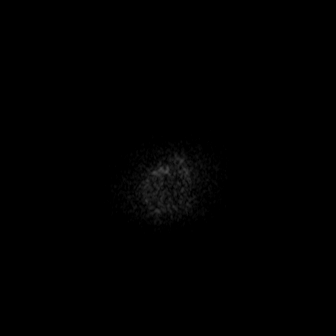

[Series 6: ax dwi_adc · axial · 3.0mm · 0.71mm/px · z∈[-128,+36]mm · 4 of 56 slices shown]
[im 1/56]
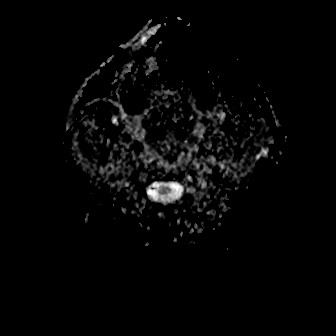
[im 19/56]
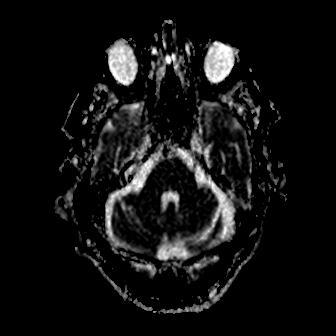
[im 37/56]
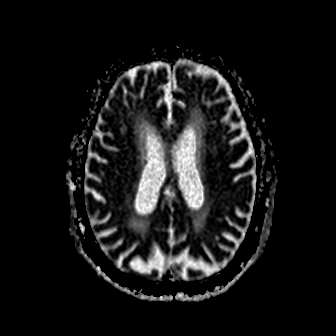
[im 56/56]
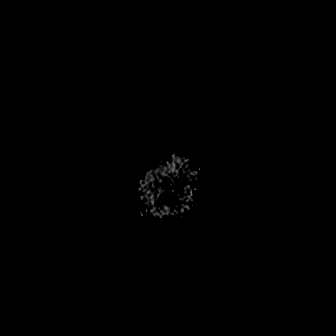

[Series 7: cor dwi_tracew · coronal · 5.0mm · 0.68mm/px · 3 of 40 slices shown]
[im 1/40]
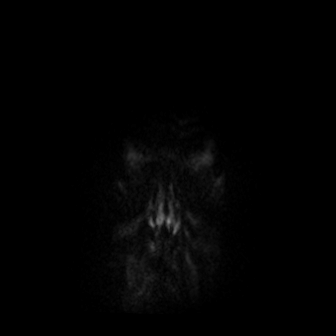
[im 20/40]
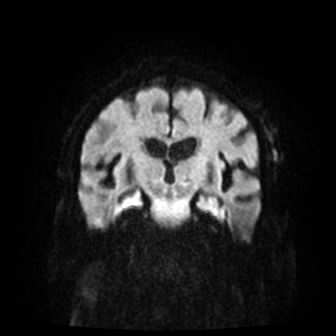
[im 40/40]
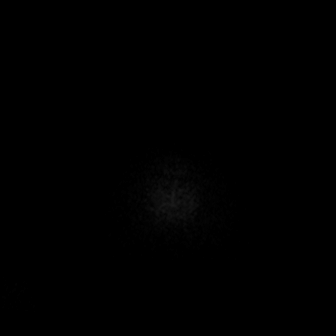

[Series 8: cor dwi_adc · coronal · 5.0mm · 0.68mm/px · 3 of 40 slices shown]
[im 1/40]
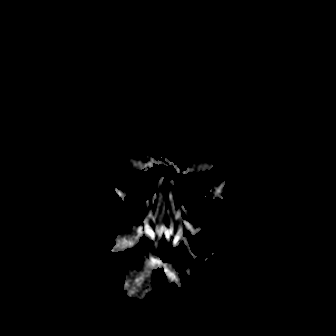
[im 20/40]
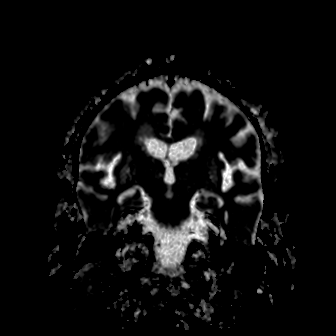
[im 40/40]
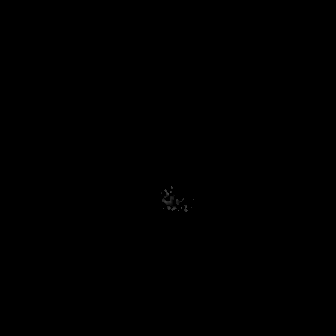

[Series 10: pha_images · axial · 3.0mm · 0.90mm/px · 1 of 52 slices shown]
[im 1/52]
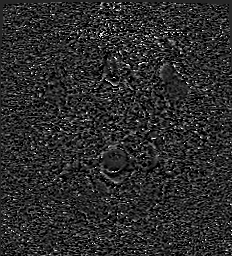

[Series 13: FLAIR · axial · 3.0mm · 0.53mm/px · z∈[-127,+33]mm · 4 of 55 slices shown (1 of 2)]
[im 1/55]
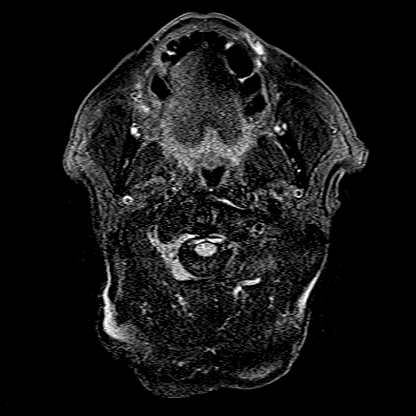
[im 19/55]
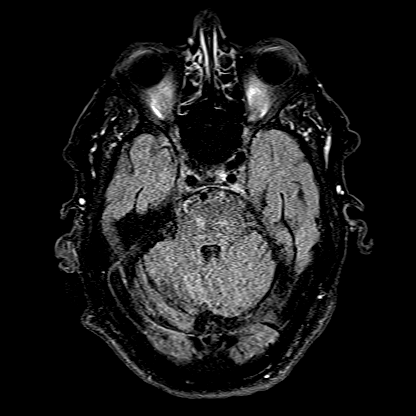
[im 37/55]
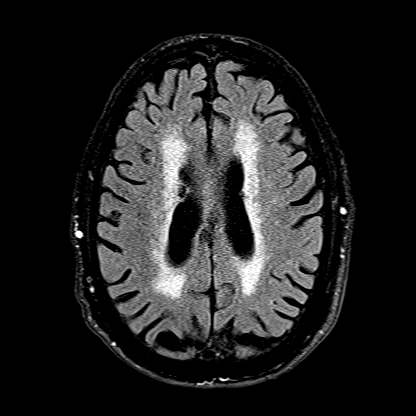
[im 55/55]
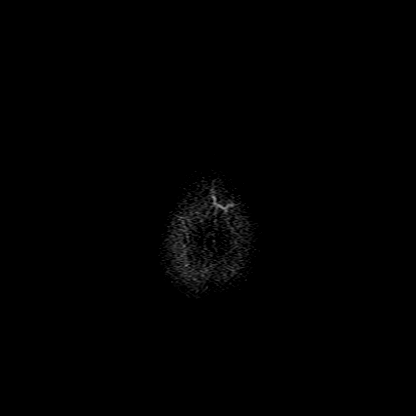

[Series 14: T1 · sagittal · 5.0mm · 0.62mm/px · 1 of 21 slices shown (1 of 2)]
[im 1/21]
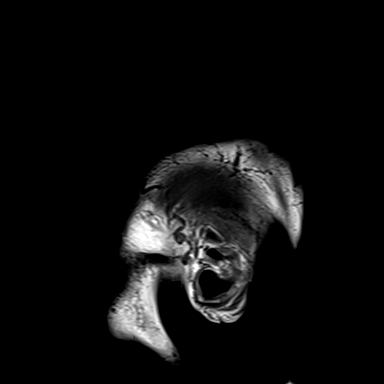

[Series 15: T2 · axial · 5.0mm · 0.45mm/px · z∈[-120,+35]mm · 2 of 27 slices shown (1 of 2)]
[im 1/27]
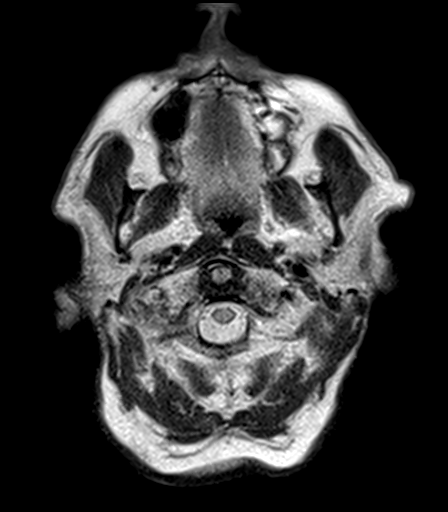
[im 27/27]
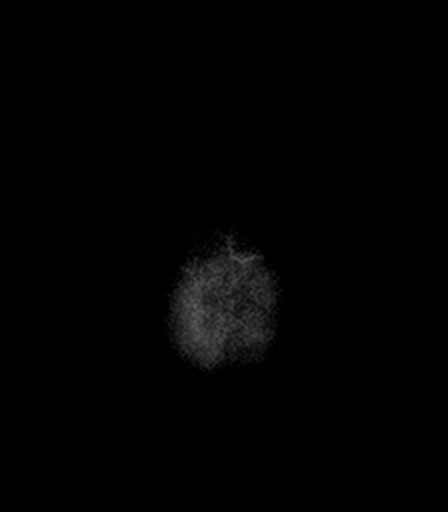

[Series 16: T1 · axial · 1.0mm · 0.98mm/px · z∈[-120,+37]mm · 8 of 160 slices shown (2 of 2)]
[im 1/160]
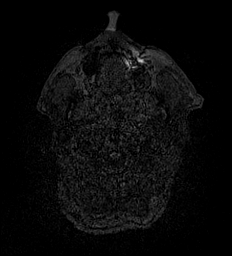
[im 18/160]
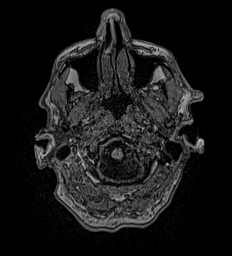
[im 54/160]
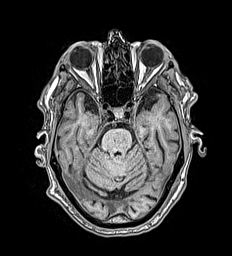
[im 71/160]
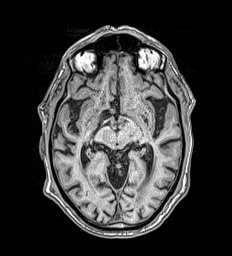
[im 89/160]
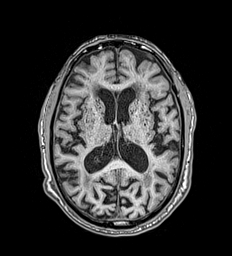
[im 107/160]
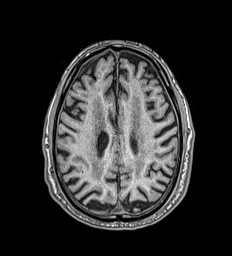
[im 142/160]
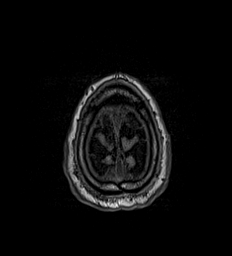
[im 160/160]
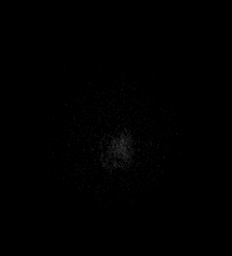

[Series 17: T2 · coronal · 3.0mm · 0.23mm/px · 2 of 35 slices shown (2 of 2)]
[im 1/35]
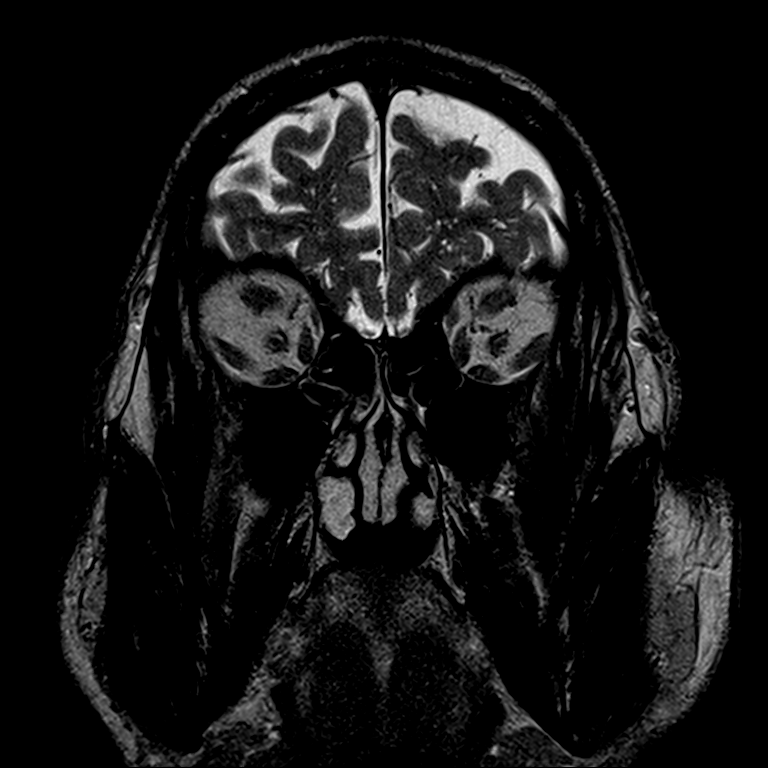
[im 35/35]
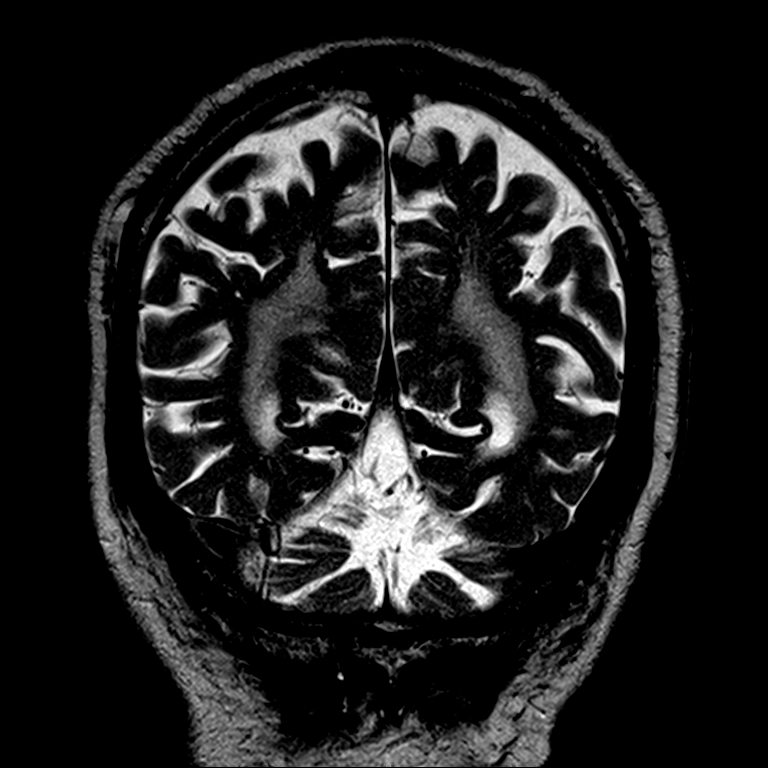

[Series 18: FLAIR · coronal · 3.0mm · 0.35mm/px · 2 of 35 slices shown (2 of 2)]
[im 1/35]
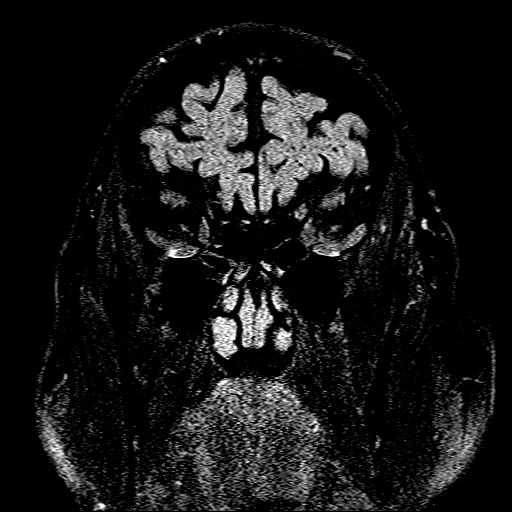
[im 35/35]
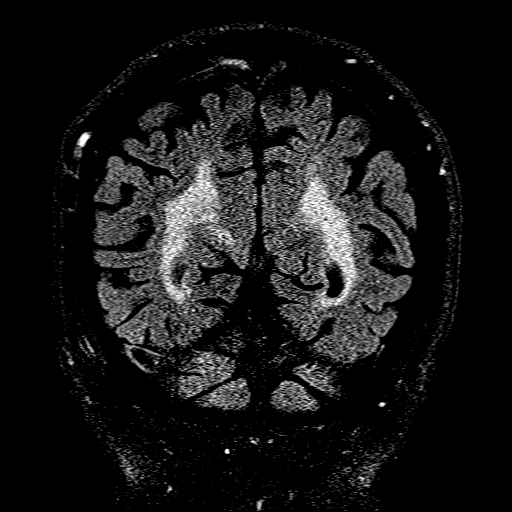

[33 of 48 positions shown; findings below may reference images not displayed]

FINDINGS: MRI HEAD FINDINGS

Brain: No acute infarct, mass effect or extra-axial collection.
Multiple chronic microhemorrhages in a predominantly central
distribution. Hyperintense T2-weighted signal is widespread
throughout the white matter. Diffuse, severe atrophy. The midline
structures are normal.

Vascular: Major flow voids are preserved.

Skull and upper cervical spine: Normal calvarium and skull base.
Visualized upper cervical spine and soft tissues are normal.

Sinuses/Orbits:No paranasal sinus fluid levels or advanced mucosal
thickening. No mastoid or middle ear effusion. Normal orbits.

MRA HEAD FINDINGS

POSTERIOR CIRCULATION:

--Vertebral arteries: Normal

--Inferior cerebellar arteries: Normal.

--Basilar artery: Normal.

--Superior cerebellar arteries: Normal.

--Posterior cerebral arteries: Normal.

ANTERIOR CIRCULATION:

--Intracranial internal carotid arteries: Normal.

--Anterior cerebral arteries (ACA): Normal.

--Middle cerebral arteries (MCA): Normal.

ANATOMIC VARIANTS: None
IMPRESSION: 1. No acute intracranial abnormality.
2. Diffuse, severe atrophy and chronic small vessel disease.
3. Multiple chronic microhemorrhages in a predominantly central
distribution, likely due to chronic hypertensive angiopathy
4. Normal intracranial MRA.

## 2023-05-21 IMAGING — CT CT HEAD CODE STROKE
3 series · 14 of 47 positions shown, 16 images · non-contrast
Comparison: Head CT 04/23/2021. brain MRI 06/09/2018.

CLINICAL DATA: Code stroke. Neuro deficit, acute, stroke suspected.
Additional history provided: Slurred speech, left-sided droop, last
known well [DATE] p.m.

EXAM:
CT HEAD WITHOUT CONTRAST
TECHNIQUE: Contiguous axial images were obtained from the base of the skull
through the vertex without intravenous contrast.

[Series 3: head wo · axial · 0.44mm/px · z∈[-117,+23]mm · 8 of 34 slices shown, 10 images]
[im 3/34  brain]
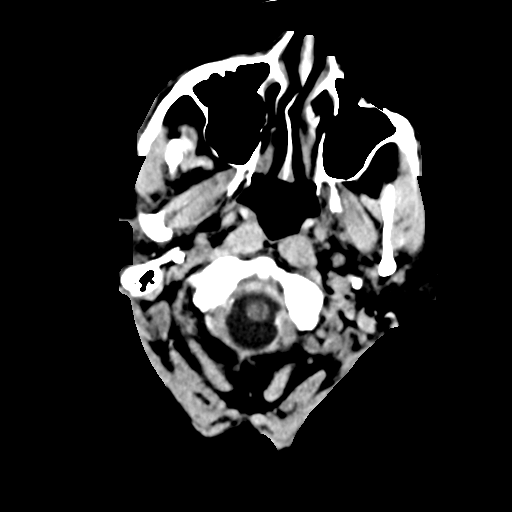
[im 3/34  bone]
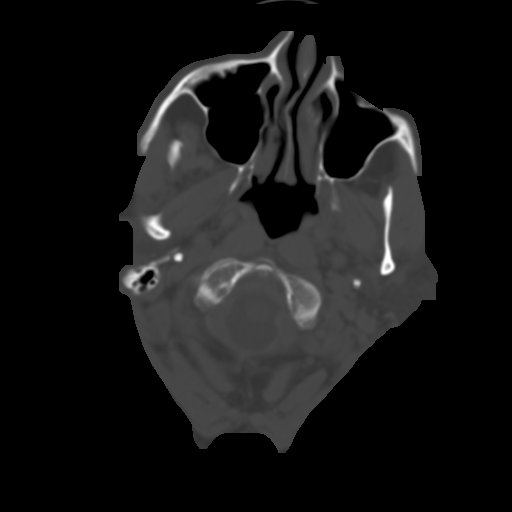
[im 7/34  brain]
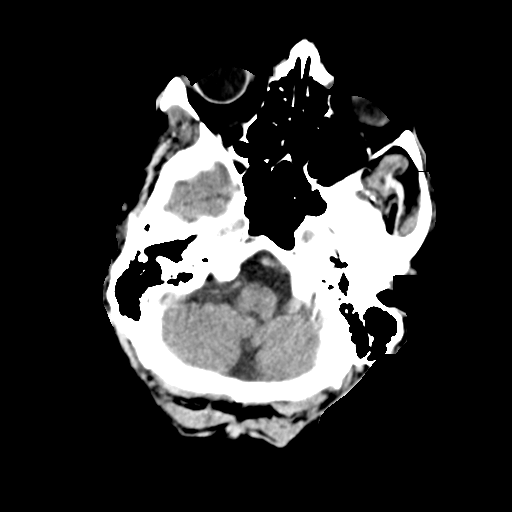
[im 11/34  brain]
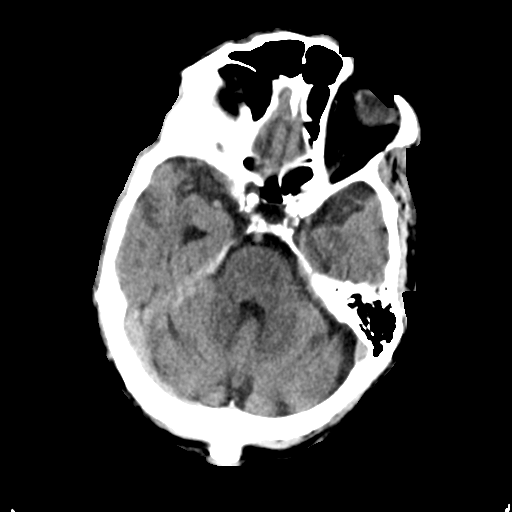
[im 15/34  brain]
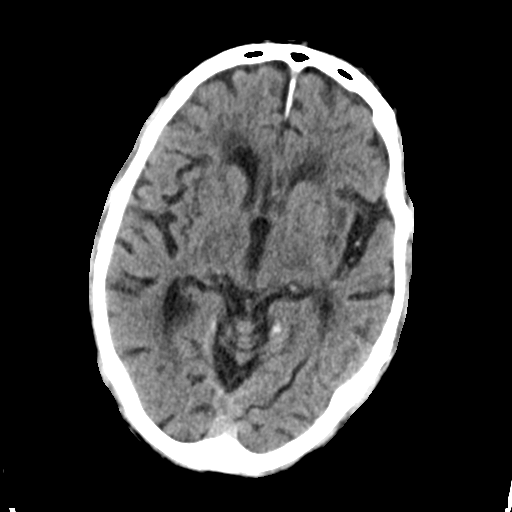
[im 19/34  brain]
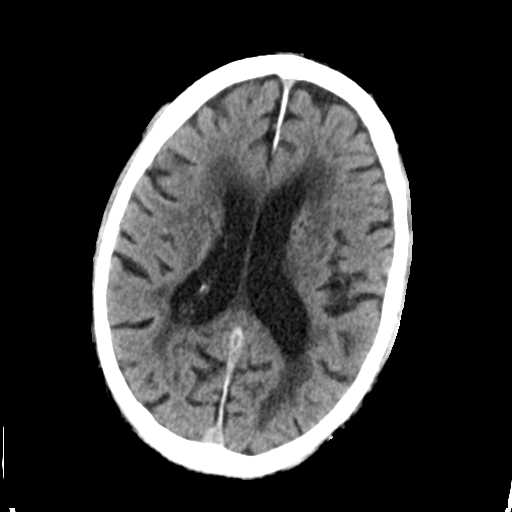
[im 19/34  bone]
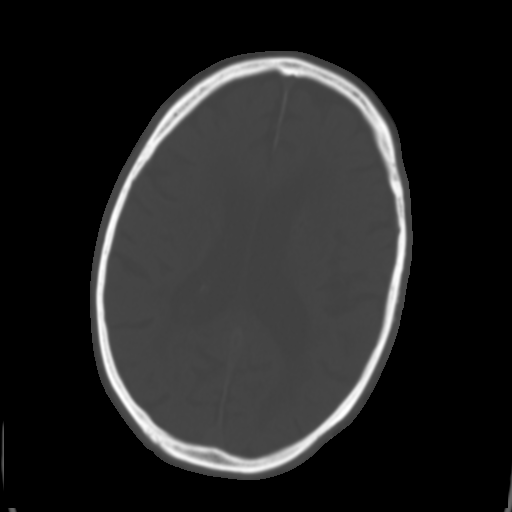
[im 23/34  brain]
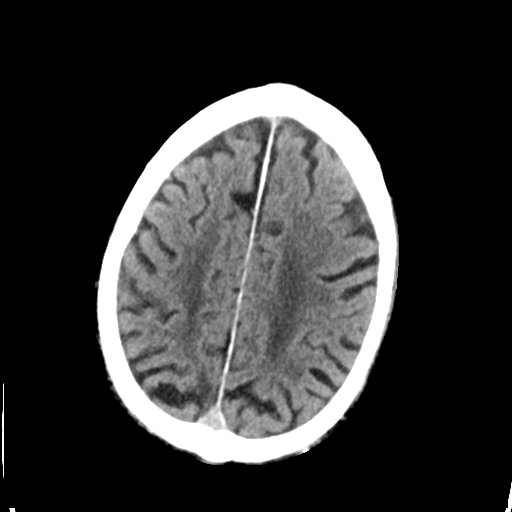
[im 27/34  brain]
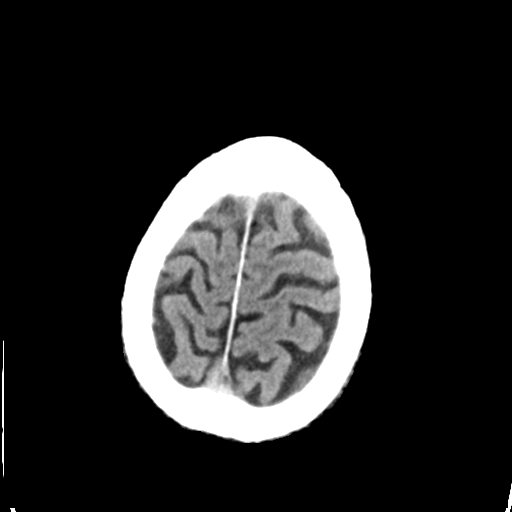
[im 31/34  brain]
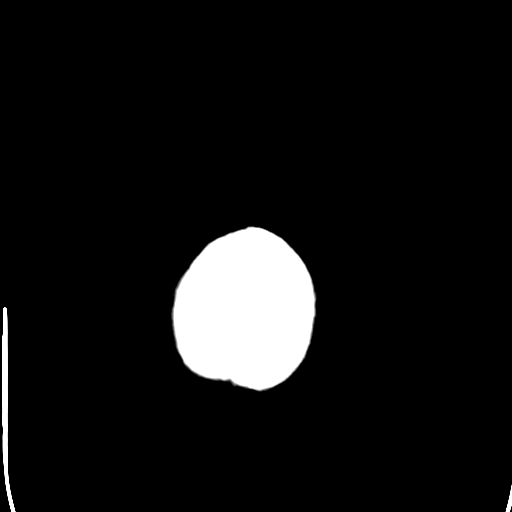

[Series 5: coronal soft tissue · coronal · 0.31mm/px · 3 of 72 slices shown]
[im 24/72  brain]
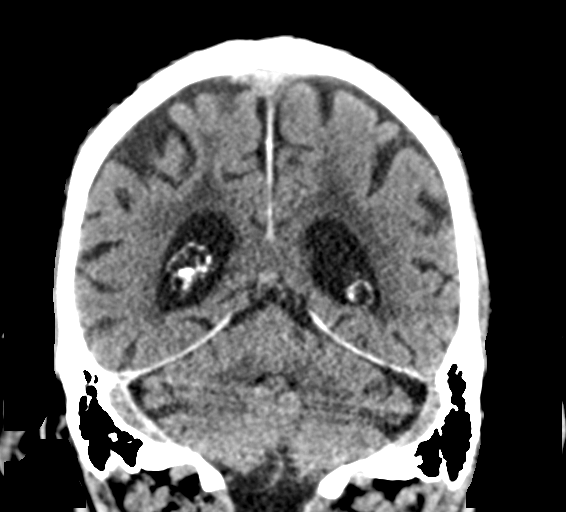
[im 32/72  brain]
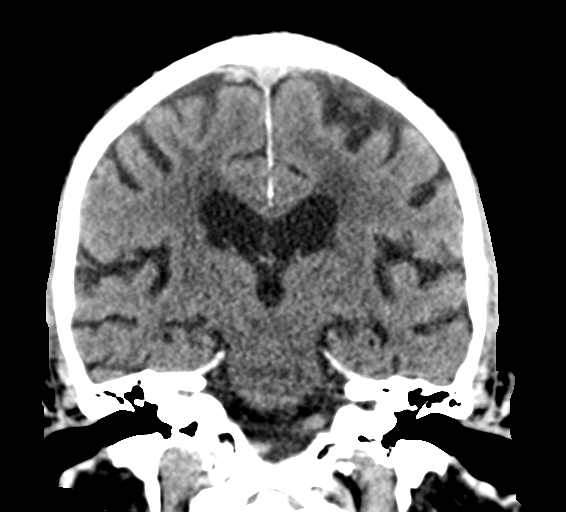
[im 40/72  brain]
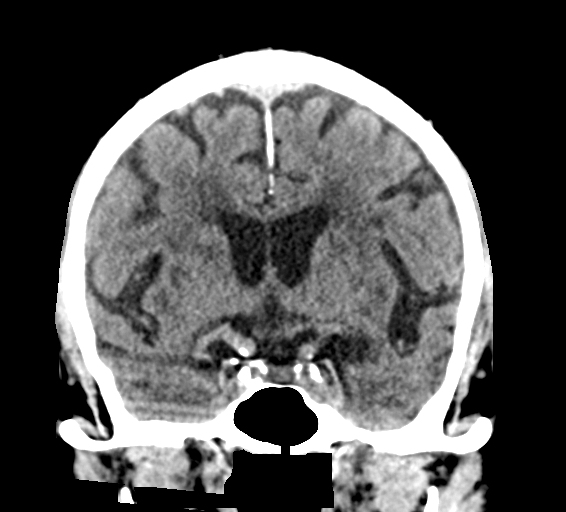

[Series 6: sagittal soft tissue · sagittal · 0.31mm/px · 3 of 60 slices shown]
[im 20/60  brain]
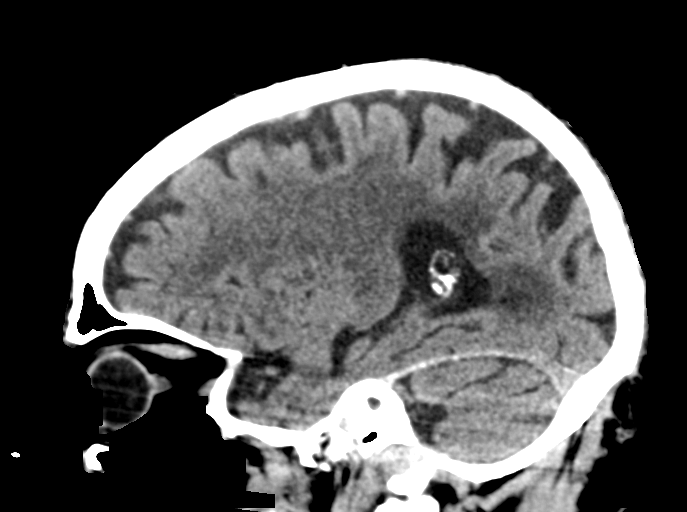
[im 30/60  brain]
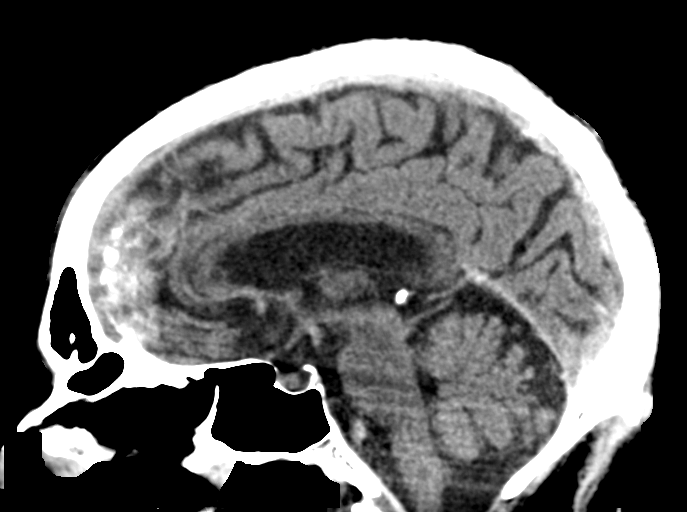
[im 40/60  brain]
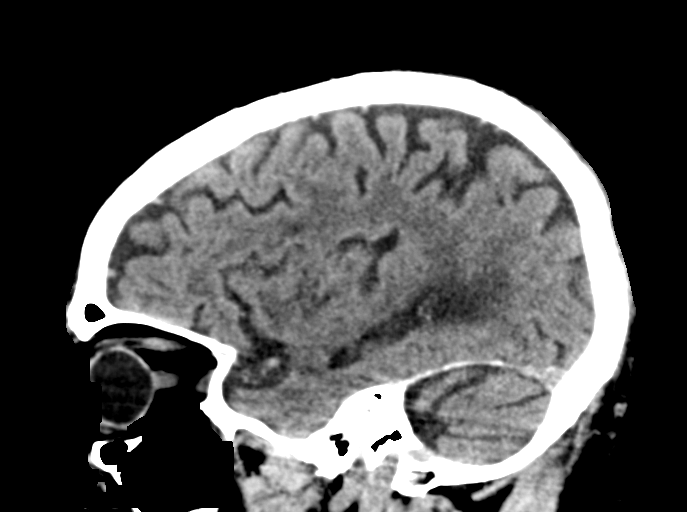

[14 of 47 positions shown; findings below may reference images not displayed]

FINDINGS: Brain:

Mild generalized cerebral and cerebellar atrophy.

Moderate/advanced patchy and ill-defined hypoattenuation within the
cerebral white matter, nonspecific but compatible with chronic small
vessel ischemic disease.

Prominent perivascular space versus chronic lacunar infarct within
the left thalamus, unchanged.

There is no acute intracranial hemorrhage.

No demarcated cortical infarct.

No extra-axial fluid collection.

No evidence of an intracranial mass.

No midline shift.

Vascular: No hyperdense vessel.  Atherosclerotic calcifications.

Skull: Normal. Negative for fracture or focal lesion.

Sinuses/Orbits: Visualized orbits show no acute finding. Mild
mucosal thickening within the left maxillary sinus at the imaged
levels. Mild mucosal thickening within the bilateral ethmoid
sinuses. Redemonstrated chronic deformity of the outer table of the
right frontal sinus.

ASPECTS (Alberta Stroke Program Early CT Score)

- Ganglionic level infarction (caudate, lentiform nuclei, internal
capsule, insula, M1-M3 cortex): 7

- Supraganglionic infarction (M4-M6 cortex): 3

Total score (0-10 with 10 being normal): 10

These results were communicated to Dr. Tiger at [DATE] pmon
05/08/2021by text page via the [employer] messaging system.
IMPRESSION: No evidence of acute intracranial abnormality.  ASPECTS is 10.

Moderate/severe chronic small vessel ischemic changes within the
cerebral white matter.

Prominent perivascular space versus chronic lacunar infarct within
the left thalamus, unchanged.

Mild generalized parenchymal atrophy.

Paranasal sinus disease, as described.
